# Patient Record
Sex: Male | Born: 1965 | Race: White | Hispanic: No | Smoking: Never smoker
Health system: Southern US, Community
[De-identification: ages and names within clinical notes are randomized; demographics above are authoritative.]

## PROBLEM LIST (undated history)

## (undated) DIAGNOSIS — G709 Myoneural disorder, unspecified: Secondary | ICD-10-CM

## (undated) DIAGNOSIS — T7840XA Allergy, unspecified, initial encounter: Secondary | ICD-10-CM

## (undated) DIAGNOSIS — E079 Disorder of thyroid, unspecified: Secondary | ICD-10-CM

## (undated) DIAGNOSIS — Z21 Asymptomatic human immunodeficiency virus [HIV] infection status: Secondary | ICD-10-CM

## (undated) DIAGNOSIS — K219 Gastro-esophageal reflux disease without esophagitis: Secondary | ICD-10-CM

## (undated) DIAGNOSIS — F32A Depression, unspecified: Secondary | ICD-10-CM

## (undated) DIAGNOSIS — R011 Cardiac murmur, unspecified: Secondary | ICD-10-CM

## (undated) DIAGNOSIS — F419 Anxiety disorder, unspecified: Secondary | ICD-10-CM

## (undated) DIAGNOSIS — B20 Human immunodeficiency virus [HIV] disease: Secondary | ICD-10-CM

## (undated) HISTORY — DX: Myoneural disorder, unspecified: G70.9

## (undated) HISTORY — DX: Allergy, unspecified, initial encounter: T78.40XA

## (undated) HISTORY — DX: Depression, unspecified: F32.A

## (undated) HISTORY — DX: Anxiety disorder, unspecified: F41.9

## (undated) HISTORY — DX: Gastro-esophageal reflux disease without esophagitis: K21.9

## (undated) HISTORY — DX: Disorder of thyroid, unspecified: E07.9

## (undated) HISTORY — DX: Asymptomatic human immunodeficiency virus (hiv) infection status: Z21

## (undated) HISTORY — DX: Human immunodeficiency virus (HIV) disease: B20

## (undated) HISTORY — DX: Cardiac murmur, unspecified: R01.1

---

## 2016-08-07 ENCOUNTER — Telehealth: Payer: Self-pay

## 2016-08-07 ENCOUNTER — Other Ambulatory Visit: Payer: Self-pay | Admitting: *Deleted

## 2016-08-07 ENCOUNTER — Other Ambulatory Visit (INDEPENDENT_AMBULATORY_CARE_PROVIDER_SITE_OTHER): Payer: Self-pay

## 2016-08-07 DIAGNOSIS — B20 Human immunodeficiency virus [HIV] disease: Secondary | ICD-10-CM

## 2016-08-07 DIAGNOSIS — Z113 Encounter for screening for infections with a predominantly sexual mode of transmission: Secondary | ICD-10-CM

## 2016-08-07 DIAGNOSIS — Z79899 Other long term (current) drug therapy: Secondary | ICD-10-CM

## 2016-08-07 LAB — CBC WITH DIFFERENTIAL/PLATELET
BASOS ABS: 55 {cells}/uL (ref 0–200)
Basophils Relative: 1 %
EOS ABS: 110 {cells}/uL (ref 15–500)
Eosinophils Relative: 2 %
HCT: 42.9 % (ref 38.5–50.0)
Hemoglobin: 14.9 g/dL (ref 13.2–17.1)
LYMPHS PCT: 52 %
Lymphs Abs: 2860 cells/uL (ref 850–3900)
MCH: 29.9 pg (ref 27.0–33.0)
MCHC: 34.7 g/dL (ref 32.0–36.0)
MCV: 86.1 fL (ref 80.0–100.0)
MONOS PCT: 9 %
MPV: 9.2 fL (ref 7.5–12.5)
Monocytes Absolute: 495 cells/uL (ref 200–950)
NEUTROS PCT: 36 %
Neutro Abs: 1980 cells/uL (ref 1500–7800)
PLATELETS: 257 10*3/uL (ref 140–400)
RBC: 4.98 MIL/uL (ref 4.20–5.80)
RDW: 13.1 % (ref 11.0–15.0)
WBC: 5.5 10*3/uL (ref 3.8–10.8)

## 2016-08-07 LAB — COMPLETE METABOLIC PANEL WITH GFR
ALK PHOS: 53 U/L (ref 40–115)
ALT: 23 U/L (ref 9–46)
AST: 21 U/L (ref 10–35)
Albumin: 4.2 g/dL (ref 3.6–5.1)
BILIRUBIN TOTAL: 0.8 mg/dL (ref 0.2–1.2)
BUN: 12 mg/dL (ref 7–25)
CO2: 25 mmol/L (ref 20–31)
Calcium: 8.8 mg/dL (ref 8.6–10.3)
Chloride: 105 mmol/L (ref 98–110)
Creat: 0.93 mg/dL (ref 0.70–1.33)
Glucose, Bld: 96 mg/dL (ref 65–99)
Potassium: 3.9 mmol/L (ref 3.5–5.3)
Sodium: 137 mmol/L (ref 135–146)
TOTAL PROTEIN: 7.3 g/dL (ref 6.1–8.1)

## 2016-08-07 LAB — HEPATITIS B SURFACE ANTIGEN: HEP B S AG: NEGATIVE

## 2016-08-07 LAB — LIPID PANEL
CHOL/HDL RATIO: 4.3 ratio (ref <=5.0)
Cholesterol: 149 mg/dL (ref 125–200)
HDL: 35 mg/dL — ABNORMAL LOW (ref >=40)
LDL CALC: 81 mg/dL (ref <130)
Triglycerides: 167 mg/dL — ABNORMAL HIGH (ref <150)
VLDL: 33 mg/dL — AB (ref <30)

## 2016-08-07 LAB — HEPATITIS A ANTIBODY, TOTAL: HEP A TOTAL AB: REACTIVE — AB

## 2016-08-07 LAB — HEPATITIS B SURFACE ANTIBODY,QUALITATIVE: HEP B S AB: POSITIVE — AB

## 2016-08-07 LAB — HEPATITIS C ANTIBODY: HCV Ab: NEGATIVE

## 2016-08-07 LAB — HEPATITIS B CORE ANTIBODY, TOTAL: Hep B Core Total Ab: NONREACTIVE

## 2016-08-07 MED ORDER — CYCLOBENZAPRINE HCL 5 MG PO TABS: 5.0000 mg | ORAL_TABLET | Freq: Every day | ORAL | 3 refills | Status: DC

## 2016-08-07 MED ORDER — SERTRALINE HCL 50 MG PO TABS: 50.0000 mg | ORAL_TABLET | Freq: Every day | ORAL | 3 refills | Status: DC

## 2016-08-07 MED ORDER — LEVOTHYROXINE SODIUM 200 MCG PO TABS: 200.0000 ug | ORAL_TABLET | Freq: Every day | ORAL | 11 refills | Status: DC

## 2016-08-07 NOTE — Telephone Encounter (Signed)
Patient is a recent transfer from [facility] .  Medical records received.  He is in need of assistance with Zoloft and Synthroid Our office is processing patient assistance on the Synthroid and he will need to pay for the Zoloft.  He has one month of Genvoya to date.   Current regimen: Genvoya  Zoloft 50 mg once daily Synthroid 200 mcg daily.

## 2016-08-07 NOTE — Telephone Encounter (Signed)
AB[facility]IE, RX Outreach applications.

## 2016-08-08 LAB — HIV-1 RNA ULTRAQUANT REFLEX TO GENTYP+

## 2016-08-08 LAB — URINE CYTOLOGY ANCILLARY ONLY
CHLAMYDIA, DNA PROBE: NEGATIVE
Neisseria Gonorrhea: NEGATIVE

## 2016-08-08 LAB — RPR: RPR Ser Ql: REACTIVE — AB

## 2016-08-08 LAB — T-HELPER CELL (CD4) - (RCID CLINIC ONLY)
CD4 T CELL HELPER: 28 % — AB (ref 33–55)
CD4 T Cell Abs: 780 /uL (ref 400–2700)

## 2016-08-08 LAB — RPR TITER

## 2016-08-09 LAB — QUANTIFERON TB GOLD ASSAY (BLOOD)
Interferon Gamma Release Assay: NEGATIVE
Mitogen-Nil: 9.36 IU/mL
Quantiferon Nil Value: 0.28 IU/mL
Quantiferon Tb Ag Minus Nil Value: 0.03 IU/mL

## 2016-08-11 ENCOUNTER — Encounter: Payer: Self-pay | Admitting: Internal Medicine

## 2016-08-11 LAB — FLUORESCENT TREPONEMAL AB(FTA)-IGG-BLD: FLUORESCENT TREPONEMAL ABS: REACTIVE — AB

## 2016-08-14 LAB — HLA B*5701: HLA-B*5701 w/rflx HLA-B High: NEGATIVE

## 2016-08-14 LAB — HIV-1 GENOTYPR PLUS

## 2016-08-21 ENCOUNTER — Ambulatory Visit (INDEPENDENT_AMBULATORY_CARE_PROVIDER_SITE_OTHER): Payer: Self-pay | Admitting: Internal Medicine

## 2016-08-21 ENCOUNTER — Encounter: Payer: Self-pay | Admitting: Internal Medicine

## 2016-08-21 DIAGNOSIS — Z8619 Personal history of other infectious and parasitic diseases: Secondary | ICD-10-CM | POA: Insufficient documentation

## 2016-08-21 DIAGNOSIS — M545 Low back pain, unspecified: Secondary | ICD-10-CM | POA: Insufficient documentation

## 2016-08-21 DIAGNOSIS — E038 Other specified hypothyroidism: Secondary | ICD-10-CM

## 2016-08-21 DIAGNOSIS — B20 Human immunodeficiency virus [HIV] disease: Secondary | ICD-10-CM

## 2016-08-21 DIAGNOSIS — R85619 Unspecified abnormal cytological findings in specimens from anus: Secondary | ICD-10-CM

## 2016-08-21 DIAGNOSIS — Z21 Asymptomatic human immunodeficiency virus [HIV] infection status: Secondary | ICD-10-CM | POA: Insufficient documentation

## 2016-08-21 DIAGNOSIS — E663 Overweight: Secondary | ICD-10-CM

## 2016-08-21 DIAGNOSIS — G2581 Restless legs syndrome: Secondary | ICD-10-CM | POA: Insufficient documentation

## 2016-08-21 DIAGNOSIS — R896 Abnormal cytological findings in specimens from other organs, systems and tissues: Secondary | ICD-10-CM

## 2016-08-21 DIAGNOSIS — E039 Hypothyroidism, unspecified: Secondary | ICD-10-CM | POA: Insufficient documentation

## 2016-08-21 MED ORDER — LEVOTHYROXINE SODIUM 200 MCG PO TABS: 200.0000 ug | ORAL_TABLET | Freq: Every day | ORAL | 3 refills | Status: DC

## 2016-08-21 MED ORDER — ELVITEG-COBIC-EMTRICIT-TENOFAF 150-150-200-10 MG PO TABS: 1.0000 | ORAL_TABLET | Freq: Every day | ORAL | 5 refills | Status: DC

## 2016-08-21 MED ORDER — SERTRALINE HCL 50 MG PO TABS: 50.0000 mg | ORAL_TABLET | Freq: Every day | ORAL | 3 refills | Status: DC

## 2016-08-21 NOTE — Progress Notes (Signed)
Patient ID: Drew Jenkins, male    DOB: 08/26/1966, 50 y.o.   MRN: [id number]  Reason for visit: to establish care as a new patient with HIV  HPI:   Patient was first diagnosed in 2002 and started treatment in 2003 initially with Sustiva and Combivir, then Truvada, then Atripla, then Prezista, norvir and Truvada and this year started on [place]. The CD4 count is 780 now and does not remember a nadir, but presumably low to have started medication in 2003, viral load < 20.  Viral load was in the 400s in April of this year.   There have been no associated symptoms of diarrhea, no weight loss.    PMHx: hypothyroidism, depression  Prior to Admission medications   Medication Sig Start Date End Date Taking? Authorizing Provider  elvitegravir-cobicistat-emtricitabine-tenofovir (GENVOYA) 150-150-200-10 MG TABS tablet Take 1 tablet by mouth daily with breakfast. 08/21/16  Yes Gardiner Barefootobert W Comer, MD  levothyroxine (SYNTHROID, LEVOTHROID) 200 MCG tablet Take 1 tablet (200 mcg total) by mouth daily before breakfast. 08/07/16  Yes Judyann Munsonynthia Snider, MD  sertraline (ZOLOFT) 50 MG tablet Take 1 tablet (50 mg total) by mouth daily. 08/07/16  Yes Judyann Munsonynthia Snider, MD    No Known Allergies  Social History  Substance Use Topics  . Smoking status: Never Smoker  . Smokeless tobacco: Never Used  . Alcohol use No    FMHx: mother with type 2 diabetes  Review of Systems Constitutional: negative for fatigue and malaise Gastrointestinal: negative for diarrhea Musculoskeletal: negative for myalgias and arthralgias All other systems reviewed and are negative   CONSTITUTIONAL:in no apparent distress and alert  Vitals:   08/21/16 1344  BP: 109/74  Pulse: 74  Temp: 98.1 F (36.7 C)   EYES: anicteric HENT: no thrush CARD:Cor RRR RESP:CTA B; normal respiratory effort NW:GNFAOGI:Bowel sounds are normal, liver is not enlarged, spleen is not enlarged MS:no pedal edema noted SKIN:no rashes NEURO: non-focal  Lab Results   Component Value Date   HIV1RNAQUANT <20 08/07/2016   No components found for: HIV1GENOTYPRPLUS No components found for: THELPERCELL  Assessment: new patient here with established HIV.  Discussed with patient treatment options and side effects, benefits of treatment, long term outcomes.  I discussed the severity of untreated HIV including higher cancer risk, opportunistic infections, renal failure.  Also discussed needing to use condoms, partner disclosure, necessary vaccines, blood monitoring.  All questions answered.  History of abnormal anal PAP.  Plan: 1) continue Genvoya, will get [facility] coverage if needed until covered 2) I refilled synthroid and zoloft until he gets to a PCP 3) discuss with his PCP if a muscle relaxant is indicated 4) will discuss anal PAP study next visit in [place] 5) refer for PCP

## 2016-09-17 ENCOUNTER — Encounter: Payer: Self-pay | Admitting: Licensed Clinical Social Worker

## 2016-09-19 ENCOUNTER — Other Ambulatory Visit: Payer: Self-pay | Admitting: *Deleted

## 2016-09-19 MED ORDER — ELVITEG-COBIC-EMTRICIT-TENOFAF 150-150-200-10 MG PO TABS: 1.0000 | ORAL_TABLET | Freq: Every day | ORAL | 5 refills | Status: DC

## 2016-09-30 ENCOUNTER — Encounter: Payer: Self-pay | Admitting: Internal Medicine

## 2016-10-08 ENCOUNTER — Ambulatory Visit: Payer: Self-pay | Admitting: Family Medicine

## 2016-11-05 ENCOUNTER — Other Ambulatory Visit: Payer: Self-pay | Admitting: *Deleted

## 2016-11-05 ENCOUNTER — Other Ambulatory Visit (INDEPENDENT_AMBULATORY_CARE_PROVIDER_SITE_OTHER): Payer: Self-pay

## 2016-11-05 DIAGNOSIS — B20 Human immunodeficiency virus [HIV] disease: Secondary | ICD-10-CM

## 2016-11-05 MED ORDER — SERTRALINE HCL 50 MG PO TABS: 50.0000 mg | ORAL_TABLET | Freq: Every day | ORAL | 5 refills | Status: DC

## 2016-11-07 LAB — T-HELPER CELL (CD4) - (RCID CLINIC ONLY)
CD4 % Helper T Cell: 25 % — ABNORMAL LOW (ref 33–55)
CD4 T Cell Abs: 980 /uL (ref 400–2700)

## 2016-11-10 LAB — HIV-1 RNA QUANT-NO REFLEX-BLD
HIV 1 RNA Quant: <20 copies/mL (ref <20)
HIV-1 RNA Quant, Log: <1.3 Log copies/mL (ref <1.30)

## 2016-11-20 ENCOUNTER — Encounter: Payer: Self-pay | Admitting: Internal Medicine

## 2016-11-20 ENCOUNTER — Ambulatory Visit (INDEPENDENT_AMBULATORY_CARE_PROVIDER_SITE_OTHER): Payer: Self-pay | Admitting: Internal Medicine

## 2016-11-20 VITALS — BP 116/68 | HR 60 | Temp 97.8°F | Ht 64.0 in | Wt 155.0 lb

## 2016-11-20 DIAGNOSIS — B20 Human immunodeficiency virus [HIV] disease: Secondary | ICD-10-CM

## 2016-11-20 DIAGNOSIS — Z0000 Encounter for general adult medical examination without abnormal findings: Secondary | ICD-10-CM

## 2016-11-20 DIAGNOSIS — F329 Major depressive disorder, single episode, unspecified: Secondary | ICD-10-CM

## 2016-11-20 DIAGNOSIS — E038 Other specified hypothyroidism: Secondary | ICD-10-CM

## 2016-11-20 DIAGNOSIS — F32A Depression, unspecified: Secondary | ICD-10-CM | POA: Insufficient documentation

## 2016-11-20 DIAGNOSIS — K219 Gastro-esophageal reflux disease without esophagitis: Secondary | ICD-10-CM

## 2016-11-20 LAB — T4, FREE: FREE T4: 1.4 ng/dL (ref 0.8–1.8)

## 2016-11-20 LAB — TSH: TSH: 6.5 mIU/L — ABNORMAL HIGH (ref 0.40–4.50)

## 2016-11-20 MED ORDER — OMEPRAZOLE 20 MG PO CPDR: 20.0000 mg | DELAYED_RELEASE_CAPSULE | Freq: Every day | ORAL | 11 refills | Status: DC

## 2016-11-20 MED ORDER — SERTRALINE HCL 50 MG PO TABS: 50.0000 mg | ORAL_TABLET | Freq: Every day | ORAL | 11 refills | Status: DC

## 2016-11-20 NOTE — Assessment & Plan Note (Signed)
I have refilled his SSRI.  He is going to establish with a PCP

## 2016-11-20 NOTE — Progress Notes (Signed)
CC: Follow up for HIV  Interval history: Currently is asymptomatic and well-controlled on Genvoya.  Since last visit he has continued on his medication via [facility] while getting established with [facility]  Has no associated n/v/d.  Denies any missed doses.     Also has hypothyroidism and depression and is controlled.  He tells me he has had a cough since about July.  No fever, no chills, no weight loss, no night sweats.  Typically dry cough, some occasional sputum.   Prior to Admission medications   Medication Sig Start Date End Date Taking? Authorizing Provider  elvitegravir-cobicistat-emtricitabine-tenofovir (GENVOYA) 150-150-200-10 MG TABS tablet Take 1 tablet by mouth daily with breakfast. 09/19/16   Ginnie SmartJeffrey C Hatcher, MD  levothyroxine (SYNTHROID, LEVOTHROID) 200 MCG tablet Take 1 tablet (200 mcg total) by mouth daily before breakfast. 08/21/16   Gardiner Barefootobert W Shaketta Rill, MD  sertraline (ZOLOFT) 50 MG tablet Take 1 tablet (50 mg total) by mouth daily. 11/05/16   Gardiner Barefootobert W Kenae Lindquist, MD    Review of Systems Constitutional: negative for fatigue and malaise Gastrointestinal: negative for diarrhea Musculoskeletal: negative for myalgias and arthralgias All other systems reviewed and are negative    Physical Exam: CONSTITUTIONAL:in no apparent distress and alert  Eyes: anicteric HENT: no thrush, no cervical lymphadenopathy Respiratory: Normal respiratory effort; CTA B GI: soft, nt  Lab Results  Component Value Date   HIV1RNAQUANT <20 11/05/2016   HIV1RNAQUANT <20 08/07/2016   No components found for: HIV1GENOTYPRPLUS No components found for: THELPERCELL   Social History   Social History  . Marital status: Unknown    Spouse name: N/A  . Number of children: N/A  . Years of education: N/A   Occupational History  . Not on file.   Social History Main Topics  . Smoking status: Never Smoker  . Smokeless tobacco: Never Used  . Alcohol use No  . Drug use: No  . Sexual activity: Not on file   Comment: pt declined condoms   Other Topics Concern  . Not on file   Social History Narrative  . No narrative on file

## 2016-11-20 NOTE — Assessment & Plan Note (Signed)
I will check his TSH and T4 today pending further management by a PCP

## 2016-11-20 NOTE — Addendum Note (Signed)
Addended by: Andree CossHOWELL, Niyonna Betsill M on: 11/20/2016 10:54 AM   Modules accepted: Orders

## 2016-11-20 NOTE — Assessment & Plan Note (Signed)
I suspect his cough is related to this.  I have sent his prilosec to the [facility] pharmacy.

## 2016-11-20 NOTE — Assessment & Plan Note (Signed)
Doing well.  rtc 4 months.  

## 2016-11-26 ENCOUNTER — Other Ambulatory Visit: Payer: Self-pay | Admitting: *Deleted

## 2016-11-26 ENCOUNTER — Telehealth: Payer: Self-pay | Admitting: *Deleted

## 2016-11-26 DIAGNOSIS — R6889 Other general symptoms and signs: Secondary | ICD-10-CM

## 2016-11-26 MED ORDER — OSELTAMIVIR PHOSPHATE 75 MG PO CAPS: 75.0000 mg | ORAL_CAPSULE | Freq: Two times a day (BID) | ORAL | 0 refills | Status: DC

## 2016-11-26 NOTE — Addendum Note (Signed)
Addended by: Andree CossHOWELL, Taniyah Ballow M on: 11/26/2016 04:20 PM   Modules accepted: Orders

## 2016-11-26 NOTE — Telephone Encounter (Signed)
Patient seen last week by Dr. Luciana Axeomer.  Now complaining of temperature up to 102.2 since Monday, Dec., 11.  Patient requesting medication for symptoms listed above.  Message being forwarded to Dr. Luciana Axeomer.

## 2016-11-26 NOTE — Telephone Encounter (Signed)
Patient walked into clinic. RN relayed answer, sent tamiflu rx per phone note, relayed advice per notes.  Patient requested note for work - given. Andree CossHowell, Michelle M, RN

## 2016-11-26 NOTE — Telephone Encounter (Signed)
Can give him Tamiflu 75 mg twice a day for 5 days.  Ibuprofen and hydration of course. thanks

## 2017-01-12 ENCOUNTER — Ambulatory Visit: Payer: Self-pay

## 2017-01-21 ENCOUNTER — Ambulatory Visit: Payer: Self-pay

## 2017-01-21 ENCOUNTER — Telehealth: Payer: Self-pay | Admitting: *Deleted

## 2017-01-21 NOTE — Telephone Encounter (Signed)
Patient asked for help finding primary care. He is also worried he is developing sleep apnea. RN gave him the [facility] to [facility], he is scheduled 3/14 at 9:30.   Patient will need information regarding the [employer] as well as the [employer] Patient Assistance program. Patient notified of appointment, location, phone number for primary care. Andree CossHowell, Keatyn Jawad M, RN

## 2017-01-23 ENCOUNTER — Encounter: Payer: Self-pay | Admitting: Internal Medicine

## 2017-02-12 ENCOUNTER — Ambulatory Visit: Payer: Self-pay | Admitting: Family Medicine

## 2017-02-25 ENCOUNTER — Ambulatory Visit: Payer: Self-pay | Admitting: Family Medicine

## 2017-03-19 ENCOUNTER — Other Ambulatory Visit (INDEPENDENT_AMBULATORY_CARE_PROVIDER_SITE_OTHER): Payer: Self-pay

## 2017-03-19 DIAGNOSIS — B20 Human immunodeficiency virus [HIV] disease: Secondary | ICD-10-CM

## 2017-03-20 LAB — T-HELPER CELL (CD4) - (RCID CLINIC ONLY)
CD4 T CELL HELPER: 22 % — AB (ref 33–55)
CD4 T Cell Abs: 730 /uL (ref 400–2700)

## 2017-03-23 ENCOUNTER — Ambulatory Visit: Payer: Self-pay | Admitting: Internal Medicine

## 2017-03-23 ENCOUNTER — Other Ambulatory Visit: Payer: Self-pay | Admitting: Internal Medicine

## 2017-03-23 DIAGNOSIS — E039 Hypothyroidism, unspecified: Secondary | ICD-10-CM

## 2017-03-23 LAB — HIV-1 RNA QUANT-NO REFLEX-BLD
HIV 1 RNA Quant: 116 copies/mL — ABNORMAL HIGH
HIV-1 RNA Quant, Log: 2.06 Log copies/mL — ABNORMAL HIGH

## 2017-03-31 ENCOUNTER — Ambulatory Visit: Payer: Self-pay

## 2017-04-09 ENCOUNTER — Ambulatory Visit (INDEPENDENT_AMBULATORY_CARE_PROVIDER_SITE_OTHER): Payer: Self-pay | Admitting: Internal Medicine

## 2017-04-09 ENCOUNTER — Other Ambulatory Visit: Payer: Self-pay | Admitting: Internal Medicine

## 2017-04-09 ENCOUNTER — Encounter: Payer: Self-pay | Admitting: Internal Medicine

## 2017-04-09 VITALS — BP 128/77 | HR 54 | Temp 98.3°F | Ht 64.0 in | Wt 159.0 lb

## 2017-04-09 DIAGNOSIS — Z113 Encounter for screening for infections with a predominantly sexual mode of transmission: Secondary | ICD-10-CM

## 2017-04-09 DIAGNOSIS — Z79899 Other long term (current) drug therapy: Secondary | ICD-10-CM

## 2017-04-09 DIAGNOSIS — F329 Major depressive disorder, single episode, unspecified: Secondary | ICD-10-CM

## 2017-04-09 DIAGNOSIS — B20 Human immunodeficiency virus [HIV] disease: Secondary | ICD-10-CM

## 2017-04-09 NOTE — Progress Notes (Signed)
CC: Follow up for HIV  Interval history: Currently is asymptomatic and doing well on Genvoya.   Has no associated n/v/d.  Denies any missed doses.    He is establishing with a PCP next month.  He recently lost his partner and dealing with depression.  No sleep disturbance, no SI.  He had been on ssri but does not feel it is helping. CD4 730, viral load 116.    Prior to Admission medications   Medication Sig Start Date End Date Taking? Authorizing Provider  elvitegravir-cobicistat-emtricitabine-tenofovir (GENVOYA) 150-150-200-10 MG TABS tablet Take 1 tablet by mouth daily with breakfast. 09/19/16   Ginnie Smart, MD  levothyroxine (SYNTHROID, LEVOTHROID) 200 MCG tablet Take 1 tablet (200 mcg total) by mouth daily before breakfast. 08/21/16   Gardiner Barefoot, MD  sertraline (ZOLOFT) 50 MG tablet Take 1 tablet (50 mg total) by mouth daily. 11/05/16   Gardiner Barefoot, MD    Review of Systems Constitutional: negative for fatigue and malaise Gastrointestinal: negative for diarrhea Musculoskeletal: negative for myalgias and arthralgias All other systems reviewed and are negative    Physical Exam: CONSTITUTIONAL:in no apparent distress and alert  Blood pressure 128/77, pulse (!) 54, temperature 98.3 F (36.8 C), temperature source Oral, height  (1.626 m), weight 159 lb (72.1 kg). Eyes: anicteric HENT: no thrush, no cervical lymphadenopathy Respiratory: Normal respiratory effort; CTA B GI: soft, nt  Lab Results  Component Value Date   HIV1RNAQUANT 116 (H) 03/19/2017   HIV1RNAQUANT <20 11/05/2016   HIV1RNAQUANT <20 08/07/2016   No components found for: HIV1GENOTYPRPLUS No components found for: THELPERCELL   Social History   Social History  . Marital status: Unknown    Spouse name: N/A  . Number of children: N/A  . Years of education: N/A   Occupational History  . Not on file.   Social History Main Topics  . Smoking status: Never Smoker  . Smokeless tobacco: Never Used  .  Alcohol use No  . Drug use: No  . Sexual activity: Not on file     Comment: pt declined condoms   Other Topics Concern  . Not on file   Social History Narrative  . No narrative on file

## 2017-04-09 NOTE — Assessment & Plan Note (Signed)
His vl was up a bit to 116 so will recheck today.  rtc 6 months unless concerns with it.

## 2017-04-09 NOTE — Assessment & Plan Note (Addendum)
Has underlying depression and recent loss of his partner, has worsened.  Patient will see our counselor.

## 2017-04-11 LAB — HIV-1 RNA,QN PCR W/REFLEX GENOTYPE
HIV-1 RNA, QN PCR: <1.3 {Log_copies}/mL — ABNORMAL HIGH
HIV-1 RNA, QN PCR: <20 {copies}/mL — ABNORMAL HIGH

## 2017-04-21 ENCOUNTER — Ambulatory Visit (INDEPENDENT_AMBULATORY_CARE_PROVIDER_SITE_OTHER): Payer: Self-pay | Admitting: Family Medicine

## 2017-04-21 ENCOUNTER — Encounter: Payer: Self-pay | Admitting: Family Medicine

## 2017-04-21 VITALS — BP 118/76 | HR 68 | Temp 97.8°F | Resp 16 | Ht 64.0 in | Wt 157.0 lb

## 2017-04-21 DIAGNOSIS — M5441 Lumbago with sciatica, right side: Secondary | ICD-10-CM

## 2017-04-21 DIAGNOSIS — G2581 Restless legs syndrome: Secondary | ICD-10-CM

## 2017-04-21 DIAGNOSIS — Z13220 Encounter for screening for lipoid disorders: Secondary | ICD-10-CM

## 2017-04-21 DIAGNOSIS — B20 Human immunodeficiency virus [HIV] disease: Secondary | ICD-10-CM

## 2017-04-21 DIAGNOSIS — Z131 Encounter for screening for diabetes mellitus: Secondary | ICD-10-CM

## 2017-04-21 DIAGNOSIS — E031 Congenital hypothyroidism without goiter: Secondary | ICD-10-CM

## 2017-04-21 LAB — COMPLETE METABOLIC PANEL WITH GFR
ALT: 22 U/L (ref 9–46)
AST: 23 U/L (ref 10–35)
Albumin: 4.4 g/dL (ref 3.6–5.1)
Alkaline Phosphatase: 53 U/L (ref 40–115)
BILIRUBIN TOTAL: 1 mg/dL (ref 0.2–1.2)
BUN: 7 mg/dL (ref 7–25)
CALCIUM: 9.6 mg/dL (ref 8.6–10.3)
CHLORIDE: 103 mmol/L (ref 98–110)
CO2: 25 mmol/L (ref 20–31)
CREATININE: 0.91 mg/dL (ref 0.70–1.33)
GFR, Est Non African American: >89 mL/min (ref >=60)
Glucose, Bld: 71 mg/dL (ref 65–99)
Potassium: 4.6 mmol/L (ref 3.5–5.3)
Sodium: 140 mmol/L (ref 135–146)
Total Protein: 7.7 g/dL (ref 6.1–8.1)

## 2017-04-21 LAB — CBC WITH DIFFERENTIAL/PLATELET
Basophils Absolute: 0 cells/uL (ref 0–200)
Basophils Relative: 0 %
Eosinophils Absolute: 152 cells/uL (ref 15–500)
Eosinophils Relative: 2 %
HCT: 46.3 % (ref 38.5–50.0)
HEMOGLOBIN: 15.8 g/dL (ref 13.2–17.1)
LYMPHS ABS: 3800 {cells}/uL (ref 850–3900)
LYMPHS PCT: 50 %
MCH: 29.1 pg (ref 27.0–33.0)
MCHC: 34.1 g/dL (ref 32.0–36.0)
MCV: 85.3 fL (ref 80.0–100.0)
MONO ABS: 684 {cells}/uL (ref 200–950)
MPV: 9.3 fL (ref 7.5–12.5)
Monocytes Relative: 9 %
NEUTROS PCT: 39 %
Neutro Abs: 2964 cells/uL (ref 1500–7800)
Platelets: 288 10*3/uL (ref 140–400)
RBC: 5.43 MIL/uL (ref 4.20–5.80)
RDW: 13.9 % (ref 11.0–15.0)
WBC: 7.6 10*3/uL (ref 3.8–10.8)

## 2017-04-21 LAB — POCT URINALYSIS DIP (DEVICE)
BILIRUBIN URINE: NEGATIVE
GLUCOSE, UA: NEGATIVE mg/dL
Ketones, ur: NEGATIVE mg/dL
LEUKOCYTES UA: NEGATIVE
NITRITE: NEGATIVE
Protein, ur: NEGATIVE mg/dL
Urobilinogen, UA: 0.2 mg/dL (ref 0.0–1.0)
pH: 6 (ref 5.0–8.0)

## 2017-04-21 LAB — LIPID PANEL
Cholesterol: 186 mg/dL (ref <200)
HDL: 47 mg/dL (ref >40)
LDL CALC: 108 mg/dL — AB (ref <100)
Total CHOL/HDL Ratio: 4 Ratio (ref <5.0)
Triglycerides: 156 mg/dL — ABNORMAL HIGH (ref <150)
VLDL: 31 mg/dL — ABNORMAL HIGH (ref <30)

## 2017-04-21 MED ORDER — VENLAFAXINE HCL ER 75 MG PO CP24: 150.0000 mg | ORAL_CAPSULE | Freq: Every day | ORAL | 1 refills | Status: DC

## 2017-04-21 MED ORDER — RANITIDINE HCL 150 MG PO TABS: 150.0000 mg | ORAL_TABLET | Freq: Two times a day (BID) | ORAL | 0 refills | Status: DC

## 2017-04-21 MED ORDER — GABAPENTIN 100 MG PO CAPS: 100.0000 mg | ORAL_CAPSULE | Freq: Three times a day (TID) | ORAL | 3 refills | Status: DC

## 2017-04-21 NOTE — Progress Notes (Signed)
Patient ID: Drew Jenkins, male    DOB: 10/13/1966, 51 y.o.   MRN: [id number]  PCP: Bing NeighborsHarris, Kal Chait S, FNP  Chief Complaint  Patient presents with  . Establish Care    RESTLESS LEGS  . Shoulder Pain  . Back Pain    Subjective:  HPI Drew Jenkins is a 51 y.o. male presents to establish care.  Drew Jenkins medical problems include GERD, hypothyroidism since birth , HIV, Restless leg, Depression, Chronic pain  [facility] prior to moving to [place]. For HIV management, he is followed by Dr.Comer at infectious disease.  Drew Jenkins has been previously diagnosed and treated with restless leg syndrome. Denies any pain associated with legs. Troublesome symptoms include constant jerking in both leg that keeps him awake all night.  Previously prescribed a muscle relaxant which relieved symptoms. Uncertain of which medication he was previously prescribed.  Diagnosed with degenerative disc disease in 2012. Back pain is not causing right shoulder pain and neck pain. Back pain is sometimes unilateral and bilateral. Unable to stand for long periods of time and lots of pain with walking  and prolonged sitting.Pain shoots into buttocks and upper thighs when severe.  Feels as if balance is impair. He has only taken over the counter medications such as ibuprofen and naproxen.  Prior diagnosis of depression and is currently treated with sertraline 50 mg daily. Reports a few weeks ago  He began experiencing worsening depression symptoms. He admits to 6 Zoloft pills in an attempt to commit  Suicide. Reports that he only attempted suicide once a few weeks ago. He did not seek out help or go to the  emergency room. Situations that causes stress include: financial hardship, dealing with chronic pain, he has applied for disability and his request is currently pending. Denies any suicide thoughts today or yesterday. Denies that he has a plan. Willing to seek out  counseling if he is provided with a resource.  Social History   Social History  . Marital status: Unknown    Spouse name: N/A  . Number of children: N/A  . Years of education: N/A   Occupational History  . Not on file.   Social History Main Topics  . Smoking status: Never Smoker  . Smokeless tobacco: Never Used  . Alcohol use No  . Drug use: No  . Sexual activity: Not on file     Comment: pt declined condoms   Other Topics Concern  . Not on file   Social History Narrative  . No narrative on file   Review of Systems  See HPI  Patient Active Problem List   Diagnosis Date Noted  . Screening examination for venereal disease 04/09/2017  . Encounter for long-term (current) use of high-risk medication 04/09/2017  . Depression 11/20/2016  . GERD (gastroesophageal reflux disease) 11/20/2016  . Human immunodeficiency virus I infection (HCC) 08/21/2016  . Hypothyroidism 08/21/2016  . Restless leg 08/21/2016  . H/O syphilis 08/21/2016  . Overweight (BMI 25.0-29.9) 08/21/2016  . Abnormal anal Papanicolaou smear 08/21/2016  . Lumbago 08/21/2016    No Known Allergies  Prior to Admission medications   Medication Sig Start Date End Date Taking? Authorizing Provider  elvitegravir-cobicistat-emtricitabine-tenofovir (GENVOYA) 150-150-200-10 MG TABS tablet Take 1 tablet by mouth daily with breakfast. 09/19/16  Yes Ginnie SmartHatcher, Jeffrey C, MD  levothyroxine (SYNTHROID, LEVOTHROID) 200 MCG tablet TAKE 1 TABLET BY MOUTH DAILY BEFORE BREAKFAST 03/23/17  Yes Comer, Belia Hemanobert W, MD  omeprazole (PRILOSEC)  20 MG capsule Take 1 capsule (20 mg total) by mouth daily. 11/20/16  Yes Comer, Belia Heman, MD  sertraline (ZOLOFT) 50 MG tablet Take 1 tablet (50 mg total) by mouth daily. 11/20/16  Yes Comer, Belia Heman, MD   Past Medical, Surgical Family and Social History reviewed and updated.  Depression screen [facility] 2/9 04/21/2017 04/09/2017 11/20/2016 08/21/2016  Decreased Interest 3 3 0 1  Down, Depressed, Hopeless 2 3  0 1  PHQ - 2 Score 5 6 0 2  Altered sleeping 3 2 - 1  Tired, decreased energy 3 2 - 1  Change in appetite 3 1 - 0  Feeling bad or failure about yourself  3 1 - 0  Trouble concentrating 3 1 - 0  Moving slowly or fidgety/restless 0 0 - 0  Suicidal thoughts 2 0 - 0  PHQ-9 Score 22 13 - 4  Difficult doing work/chores - Very difficult - Somewhat difficult      Objective:   Today's Vitals   04/21/17 1034  BP: 118/76  Pulse: 68  Resp: 16  Temp: 97.8 F (36.6 C)  TempSrc: Oral  SpO2: 100%  Weight: 157 lb (71.2 kg)  Height: 5\' 4"  (1.626 m)    Wt Readings from Last 3 Encounters:  04/21/17 157 lb (71.2 kg)  04/09/17 159 lb (72.1 kg)  11/20/16 155 lb (70.3 kg)    Physical Exam  Constitutional: He is oriented to person, place, and time. He appears well-developed and well-nourished.  HENT:  Head: Normocephalic and atraumatic.  Right Ear: External ear normal.  Left Ear: External ear normal.  Eyes: Conjunctivae and EOM are normal. Pupils are equal, round, and reactive to light.  Neck: Normal range of motion. Neck supple.  Cardiovascular: Normal rate, regular rhythm, normal heart sounds and intact distal pulses.   Pulmonary/Chest: Effort normal and breath sounds normal.  Abdominal: Soft. Bowel sounds are normal.  Musculoskeletal: Normal range of motion.  Neurological: He is alert and oriented to person, place, and time.  Skin: Skin is warm and dry.  Psychiatric: He has a normal mood and affect. His behavior is normal. Judgment and thought content normal.     Assessment & Plan:  1. Restless leg syndrome -Start cyclobenzaprine 10 mg up to 3 times daily as needed   2. Screening for diabetes mellitus - COMPLETE METABOLIC PANEL WITH GFR - Hemoglobin A1c  3. Congenital hypothyroidism without goiter - Thyroid Panel With TSH  4. Bilateral low back pain with right-sided sciatica, unspecified chronicity - Vitamin D, 25-hydroxy -Start cyclobenzaprine 10 mg up to 3 times daily as  needed   5. HIV (human immunodeficiency virus infection) (HCC) -Continue follow-up with Infectious Disease   6. Screening, lipid - Lipid panel  7. Major Depression -Venlafaxine XR (Effexor) 150 mg once daily -Continue Zoloft for 7 days then discontinue  -Information provided to follow-up with [employer]   RTC:  2 weeks depression follow-up     Godfrey Pick. Tiburcio Pea, MSN, [facility] [address] [place], [place] [address] [phone]

## 2017-04-21 NOTE — Patient Instructions (Addendum)
Please reach out to [facility]  [address] [place], [place] [address] [phone]  Please reach out to these services for counseling services.  For Depression, continue Zoloft for 7 days then stop. I am starting you on Effexor for depression. Start 75 mg (1 tablet) once daily for 7 days and then increase to 150 mg (2 tablets) daily.  For restless leg, start cyclobenzaprine 10 mg up to 3 times daily for leg pain.  For back, shoulder, and neck pain start Gabapentin 100 mg, 3 times daily as needed for pain.    Back Pain, Adult Back pain is very common. The pain often gets better over time. The cause of back pain is usually not dangerous. Most people can learn to manage their back pain on their own. Follow these instructions at home: Watch your back pain for any changes. The following actions may help to lessen any pain you are feeling:  Stay active. Start with short walks on flat ground if you can. Try to walk farther each day.  Exercise regularly as told by your doctor. Exercise helps your back heal faster. It also helps avoid future injury by keeping your muscles strong and flexible.  Do not sit, drive, or stand in one place for more than 30 minutes.  Do not stay in bed. Resting more than 1-2 days can slow down your recovery.  Be careful when you bend or lift an object. Use good form when lifting:  Bend at your knees.  Keep the object close to your body.  Do not twist.  Sleep on a firm mattress. Lie on your side, and bend your knees. If you lie on your back, put a pillow under your knees.  Take medicines only as told by your doctor.  Put ice on the injured area.  Put ice in a plastic bag.  Place a towel between your skin and the bag.  Leave the ice on for 20 minutes, 2-3 times a day for the first 2-3 days. After that, you can switch between ice and heat packs.  Avoid feeling anxious or stressed. Find good ways to deal with stress, such as  exercise.  Maintain a healthy weight. Extra weight puts stress on your back. Contact a doctor if:  You have pain that does not go away with rest or medicine.  You have worsening pain that goes down into your legs or buttocks.  You have pain that does not get better in one week.  You have pain at night.  You lose weight.  You have a fever or chills. Get help right away if:  You cannot control when you poop (bowel movement) or pee (urinate).  Your arms or legs feel weak.  Your arms or legs lose feeling (numbness).  You feel sick to your stomach (nauseous) or throw up (vomit).  You have belly (abdominal) pain.  You feel like you may pass out (faint). This information is not intended to replace advice given to you by your health care provider. Make sure you discuss any questions you have with your health care provider. Document Released: 05/19/2008 Document Revised: 05/08/2016 Document Reviewed: 04/04/2014 Elsevier Interactive Patient Education  2017 Elsevier Inc.  Major Depressive Disorder, Adult Major depressive disorder (MDD) is a mental health condition. It may also be called clinical depression or unipolar depression. MDD usually causes feelings of sadness, hopelessness, or helplessness. MDD can also cause physical symptoms. It can interfere with work, school, relationships, and other everyday activities.  MDD may be mild, moderate, or severe. It may occur once (single episode major depressive disorder) or it may occur multiple times (recurrent major depressive disorder). What are the causes? The exact cause of this condition is not known. MDD is most likely caused by a combination of things, which may include:  Genetic factors. These are traits that are passed along from parent to child.  Individual factors. Your personality, your behavior, and the way you handle your thoughts and feelings may contribute to MDD. This includes personality traits and behaviors learned from  others.  Physical factors, such as:  Differences in the part of your brain that controls emotion. This part of your brain may be different than it is in people who do not have MDD.  Long-term (chronic) medical or psychiatric illnesses.  Social factors. Traumatic experiences or major life changes may play a role in the development of MDD. What increases the risk? This condition is more likely to develop in women. The following factors may also make you more likely to develop MDD:  A family history of depression.  Troubled family relationships.  Abnormally low levels of certain brain chemicals.  Traumatic events in childhood, especially abuse or the loss of a parent.  Being under a lot of stress, or long-term stress, especially from upsetting life experiences or losses.  A history of:  Chronic physical illness.  Other mental health disorders.  Substance abuse.  Poor living conditions.  Experiencing social exclusion or discrimination on a regular basis. What are the signs or symptoms? The main symptoms of MDD typically include:  Constant depressed or irritable mood.  Loss of interest in things and activities. MDD symptoms may also include:  Sleeping or eating too much or too little.  Unexplained weight change.  Fatigue or low energy.  Feelings of worthlessness or guilt.  Difficulty thinking clearly or making decisions.  Thoughts of suicide or of harming others.  Physical agitation or weakness.  Isolation. Severe cases of MDD may also occur with other symptoms, such as:  Delusions or hallucinations, in which you imagine things that are not real (psychotic depression).  Low-level depression that lasts at least a year (chronic depression or persistent depressive disorder).  Extreme sadness and hopelessness (melancholic depression).  Trouble speaking and moving (catatonic depression). How is this diagnosed? This condition may be diagnosed based on:  Your  symptoms.  Your medical history, including your mental health history. This may involve tests to evaluate your mental health. You may be asked questions about your lifestyle, including any drug and alcohol use, and how long you have had symptoms of MDD.  A physical exam.  Blood tests to rule out other conditions. You must have a depressed mood and at least four other MDD symptoms most of the day, nearly every day in the same 2-week timeframe before your health care provider can confirm a diagnosis of MDD. How is this treated? This condition is usually treated by mental health professionals, such as psychologists, psychiatrists, and clinical social workers. You may need more than one type of treatment. Treatment may include:  Psychotherapy. This is also called talk therapy or counseling. Types of psychotherapy include:  Cognitive behavioral therapy (CBT). This type of therapy teaches you to recognize unhealthy feelings, thoughts, and behaviors, and replace them with positive thoughts and actions.  Interpersonal therapy (IPT). This helps you to improve the way you relate to and communicate with others.  Family therapy. This treatment includes members of your family.  Medicine to  treat anxiety and depression, or to help you control certain emotions and behaviors.  Lifestyle changes, such as:  Limiting alcohol and drug use.  Exercising regularly.  Getting plenty of sleep.  Making healthy eating choices.  Spending more time outdoors. Treatments involving stimulation of the brain can be used in situations with extremely severe symptoms, or when medicine or other therapies do not work over time. These treatments include electroconvulsive therapy, transcranial magnetic stimulation, and vagal nerve stimulation. Follow these instructions at home: Activity   Return to your normal activities as told by your health care provider.  Exercise regularly and spend time outdoors as told by your  health care provider. General instructions   Take over-the-counter and prescription medicines only as told by your health care provider.  Do not drink alcohol. If you drink alcohol, limit your alcohol intake to no more than 1 drink a day for nonpregnant women and 2 drinks a day for men. One drink equals 12 oz of beer, 5 oz of wine, or 1 oz of hard liquor. Alcohol can affect any antidepressant medicines you are taking. Talk to your health care provider about your alcohol use.  Eat a healthy diet and get plenty of sleep.  Find activities that you enjoy doing, and make time to do them.  Consider joining a support group. Your health care provider may be able to recommend a support group.  Keep all follow-up visits as told by your health care provider. This is important. Where to find more information: The [employer] on Mental Illness  www.nami.org [place] [employer] of Mental Health  http://www.maynard.net/ National Suicide Prevention Lifeline  1-800-273-TALK (978)078-8847). This is free, 24-hour help. Contact a health care provider if:  Your symptoms get worse.  You develop new symptoms. Get help right away if:  You self-harm.  You have serious thoughts about hurting yourself or others.  You see, hear, taste, smell, or feel things that are not present (hallucinate). This information is not intended to replace advice given to you by your health care provider. Make sure you discuss any questions you have with your health care provider. Document Released: 03/28/2013 Document Revised: 08/07/2016 Document Reviewed: 06/11/2016 Elsevier Interactive Patient Education  2017 [employer].

## 2017-04-22 LAB — THYROID PANEL WITH TSH
Free Thyroxine Index: 3.2 (ref 1.4–3.8)
T3 UPTAKE: 31 % (ref 22–35)
T4 TOTAL: 10.2 ug/dL (ref 4.5–12.0)
TSH: 4.31 mIU/L (ref 0.40–4.50)

## 2017-04-22 LAB — VITAMIN D 25 HYDROXY (VIT D DEFICIENCY, FRACTURES): VIT D 25 HYDROXY: 17 ng/mL — AB (ref 30–100)

## 2017-04-22 LAB — HEMOGLOBIN A1C
HEMOGLOBIN A1C: 4.7 % (ref <5.7)
MEAN PLASMA GLUCOSE: 88 mg/dL

## 2017-04-22 MED ORDER — VITAMIN D (ERGOCALCIFEROL) 1.25 MG (50000 UNIT) PO CAPS: 50000.0000 [IU] | ORAL_CAPSULE | ORAL | 1 refills | Status: DC

## 2017-04-22 MED ORDER — CYCLOBENZAPRINE HCL 10 MG PO TABS: 10.0000 mg | ORAL_TABLET | Freq: Three times a day (TID) | ORAL | 0 refills | Status: DC | PRN

## 2017-05-05 ENCOUNTER — Encounter: Payer: Self-pay | Admitting: Family Medicine

## 2017-05-05 ENCOUNTER — Ambulatory Visit (INDEPENDENT_AMBULATORY_CARE_PROVIDER_SITE_OTHER): Payer: No Typology Code available for payment source | Admitting: Family Medicine

## 2017-05-05 VITALS — BP 114/75 | HR 83 | Temp 98.1°F | Ht 64.0 in | Wt 158.0 lb

## 2017-05-05 DIAGNOSIS — F321 Major depressive disorder, single episode, moderate: Secondary | ICD-10-CM

## 2017-05-05 NOTE — Progress Notes (Signed)
Patient ID: Drew Jenkins, male    DOB: 12/07/1966, 51 y.o.   MRN: [id number]  PCP: Drew Jenkins  Chief Complaint  Patient presents with  . Jenkins    DEPRESSION    Subjective:  HPI Drew Jenkins. Drew Jenkins was seen in clinic 04/21/2017 to establish care and was found to be actively suffering from  major depression symptoms. He had been experiencing thoughts of suicide and reported an attempt to  Overdose by taking several doses of Zoloft. During his last visit he was started on Effexor and advised to  discontinue Zoloft. He reports improvement of symptoms of depression and reports overall  Improvement of mood. 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 For this reason, he is not  Interested in starting any medication for sleep. Social History   Social History  . Marital status: Unknown    Spouse name: N/A  . Number of children: N/A  . Years of education: N/A   Occupational History  . Not on file.   Social History Jenkins Topics  . Smoking status: Never Smoker  . Smokeless tobacco: Never Used  . Alcohol use No  . Drug use: No  . Sexual activity: Not on file     Comment: pt declined condoms   Other Topics Concern  . Not on file   Social History Narrative  . No narrative on file   Review of Systems See HPI  Patient Active Problem List   Diagnosis Date Noted  . Screening examination for venereal disease 04/09/2017  . Encounter for long-term (current) use of high-risk medication 04/09/2017  . Depression 11/20/2016  . GERD (gastroesophageal reflux disease) 11/20/2016  . Human immunodeficiency virus I infection (HCC) 08/21/2016  . Hypothyroidism 08/21/2016  . Restless leg 08/21/2016  . H/O syphilis 08/21/2016  . Overweight (BMI 25.0-29.9) 08/21/2016  . Abnormal  anal Papanicolaou smear 08/21/2016  . Lumbago 08/21/2016    No Known Allergies  Prior to Admission medications   Medication Sig Start Date End Date Taking? Authorizing Provider  cyclobenzaprine (FLEXERIL) 10 MG tablet Take 1 tablet (10 mg total) by mouth 3 (three) times daily as needed for muscle spasms. 04/22/17  Yes Drew Jenkins, Drew Wenzler Jenkins, Jenkins  elvitegravir-cobicistat-emtricitabine-tenofovir (GENVOYA) 150-150-200-10 MG TABS tablet Take 1 tablet by mouth daily with breakfast. 09/19/16  Yes Drew Jenkins  gabapentin (NEURONTIN) 100 MG capsule Take 1 capsule (100 mg total) by mouth 3 (three) times daily. 04/21/17  Yes Drew Jenkins, Xylina Rhoads Jenkins, Jenkins  levothyroxine (SYNTHROID, LEVOTHROID) 200 MCG tablet TAKE 1 TABLET BY MOUTH DAILY BEFORE BREAKFAST 03/23/17  Yes Drew Jenkins  ranitidine (ZANTAC) 150 MG tablet Take 1 tablet (150 mg total) by mouth 2 (two) times daily. 04/21/17  Yes Drew Jenkins, Martita Brumm Jenkins, Jenkins  sertraline (ZOLOFT) 50 MG tablet Take 1 tablet (50 mg total) by mouth daily. 11/20/16  Yes Drew Jenkins  venlafaxine XR (EFFEXOR-XR) 75 MG 24 hr capsule Take 2 capsules (150 mg total) by mouth daily with breakfast. 04/21/17  Yes Drew Jenkins, Sharif Rendell Jenkins, Jenkins  Vitamin D, Ergocalciferol, (DRISDOL) 50000 units CAPS capsule Take 1 capsule (50,000 Units total) by mouth every 7 (seven) days. 04/22/17  Yes Drew Jenkins, Araeya Lamb Jenkins, Jenkins    Past Medical, Surgical Family and Social History reviewed and updated.  Objective:   Today'Jenkins Vitals   05/05/17 0851  BP: 114/75  Pulse: 83  Temp: 98.1 F (36.7 C)  TempSrc: Oral  SpO2: 98%  Weight: 158 lb (71.7 kg)  Height: 5\' 4"  (1.626 m)    Wt Readings from Last 3 Encounters:  05/05/17 158 lb (71.7 kg)  04/21/17 157 lb (71.2 kg)  04/09/17 159 lb (72.1 kg)    Depression screen Drew Jenkins 2/9 05/05/2017 04/21/2017 04/09/2017 11/20/2016 08/21/2016  Decreased Interest 0 3 3 0 1  Down, Depressed, Hopeless 1 2 3  0 1  PHQ - 2 Score 1 5 6  0 2  Altered sleeping 1 3 2  - 1   Tired, decreased energy 3 3 2  - 1  Change in appetite 3 3 1  - 0  Feeling bad or failure about yourself  3 3 1  - 0  Trouble concentrating 0 3 1 - 0  Moving slowly or fidgety/restless 0 0 0 - 0  Suicidal thoughts 0 2 0 - 0  PHQ-9 Score 11 22 13  - 4  Difficult doing work/chores - - Very difficult - Somewhat difficult   Physical Exam  Constitutional: He is oriented to person, place, and time. He appears well-developed and well-nourished.  HENT:  Head: Normocephalic and atraumatic.  Eyes: Conjunctivae and EOM are normal. Pupils are equal, round, and reactive to light.  Neck: Normal range of motion. Neck supple.  Cardiovascular: Normal rate, regular rhythm, normal heart sounds and intact distal pulses.   Pulmonary/Chest: Effort normal and breath sounds normal.  Musculoskeletal: Normal range of motion. He exhibits tenderness.  Chronic low back pain  Neurological: He is alert and oriented to person, place, and time. He has normal reflexes.  Skin: Skin is warm and dry.  Psychiatric: He has a normal mood and affect. His behavior is normal. Judgment and thought content normal.    Assessment & Plan:  1. Current moderate episode of major depressive disorder without prior episode (HCC) -Continue Effexor 150 mg once daily. -If you are still taking Zoloft, discontinue. -Exercise 15-20 minutes daily to improve mood and decrease fatigue -Reach out to Drew Jenkins to schedule counseling session to  improve symptoms of depression.   RTC: 1 month depression Jenkins   Drew Jenkins. Drew Pea, MSN, Jenkins-C [facility] [place]  [address] [place], [place] [address] [phone]

## 2017-05-05 NOTE — Patient Instructions (Addendum)
Continue Effexor as prescribed. Discontinue Zoloft.  Reach out to [facility] to schedule counseling session to improve symptoms of depression.  [phone] [address], [place] [address]  To increase energy, walk vigorously for 15-20 minutes daily to improve energy level.   Major Depressive Disorder, Adult Major depressive disorder (MDD) is a mental health condition. MDD often makes you feel sad, hopeless, or helpless. MDD can also cause symptoms in your body. MDD can affect your:  Work.  School.  Relationships.  Other normal activities. MDD can range from mild to very bad. It may occur once (single episode MDD). It can also occur many times (recurrent MDD). The main symptoms of MDD often include:  Feeling sad, depressed, or irritable most of the time.  Loss of interest. MDD symptoms also include:  Sleeping too much or too little.  Eating too much or too little.  A change in your weight.  Feeling tired (fatigue) or having low energy.  Feeling worthless.  Feeling guilty.  Trouble making decisions.  Trouble thinking clearly.  Thoughts of suicide or harming others.  Feeling weak.  Feeling agitated.  Keeping yourself from being around other people (isolation). Follow these instructions at home: Activity   Do these things as told by your doctor:  Go back to your normal activities.  Exercise regularly.  Spend time outdoors. Alcohol   Talk with your doctor about how alcohol can affect your antidepressant medicines.  Do not drink alcohol. Or, limit how much alcohol you drink.  This means no more than 1 drink a day for nonpregnant women and 2 drinks a day for men. One drink equals one of these:  12 oz of beer.  5 oz of wine.  1 oz of hard liquor. General instructions   Take over-the-counter and prescription medicines only as told by your doctor.  Eat a healthy diet.  Get plenty of sleep.  Find activities that  you enjoy. Make time to do them.  Think about joining a support group. Your doctor may be able to suggest a group for you.  Keep all follow-up visits as told by your doctor. This is important. Where to find more information:   The [employer] on Mental Illness:  www.nami.org  [place] [employer] of Mental Health:  http://www.maynard.net/  National Suicide Prevention Lifeline:  802-790-7788. This is free, 24-hour help. Contact a doctor if:  Your symptoms get worse.  You have new symptoms. Get help right away if:  You self-harm.  You see, hear, taste, smell, or feel things that are not present (hallucinate). If you ever feel like you may hurt yourself or others, or have thoughts about taking your own life, get help right away. You can go to your nearest emergency department or call:  Your local emergency services (911 in the [place]  A suicide crisis helpline, such as the National Suicide Prevention Lifeline:  (859)235-5138. This is open 24 hours a day. This information is not intended to replace advice given to you by your health care provider. Make sure you discuss any questions you have with your health care provider. Document Released: 11/12/2015 Document Revised: 08/17/2016 Document Reviewed: 08/17/2016 Elsevier Interactive Patient Education  2017 Elsevier Inc.     Fatigue Fatigue is feeling tired all of the time, a lack of energy, or a lack of motivation. Occasional or mild fatigue is often a normal response to activity or life in general. However, long-lasting (chronic) or extreme fatigue may indicate an underlying medical condition.  Follow these instructions at home: Watch your fatigue for any changes. The following actions may help to lessen any discomfort you are feeling:  Talk to your health care provider about how much sleep you need each night. Try to get the required amount every night.  Take medicines only as directed by your health care  provider.  Eat a healthy and nutritious diet. Ask your health care provider if you need help changing your diet.  Drink enough fluid to keep your urine clear or pale yellow.  Practice ways of relaxing, such as yoga, meditation, massage therapy, or acupuncture.  Exercise regularly.  Change situations that cause you stress. Try to keep your work and personal routine reasonable.  Do not abuse illegal drugs.  Limit alcohol intake to no more than 1 drink per day for nonpregnant women and 2 drinks per day for men. One drink equals 12 ounces of beer, 5 ounces of wine, or 1 ounces of hard liquor.  Take a multivitamin, if directed by your health care provider. Contact a health care provider if:  Your fatigue does not get better.  You have a fever.  You have unintentional weight loss or gain.  You have headaches.  You have difficulty:  Falling asleep.  Sleeping throughout the night.  You feel angry, guilty, anxious, or sad.  You are unable to have a bowel movement (constipation).  You skin is dry.  Your legs or another part of your body is swollen. Get help right away if:  You feel confused.  Your vision is blurry.  You feel faint or pass out.  You have a severe headache.  You have severe abdominal, pelvic, or back pain.  You have chest pain, shortness of breath, or an irregular or fast heartbeat.  You are unable to urinate or you urinate less than normal.  You develop abnormal bleeding, such as bleeding from the rectum, vagina, nose, lungs, or nipples.  You vomit blood.  You have thoughts about harming yourself or committing suicide.  You are worried that you might harm someone else. This information is not intended to replace advice given to you by your health care provider. Make sure you discuss any questions you have with your health care provider. Document Released: 09/28/2007 Document Revised: 05/08/2016 Document Reviewed: 04/04/2014 Elsevier Interactive  Patient Education  2017 [employer]

## 2017-05-14 ENCOUNTER — Other Ambulatory Visit: Payer: Self-pay | Admitting: Infectious Diseases

## 2017-05-14 ENCOUNTER — Other Ambulatory Visit: Payer: Self-pay | Admitting: Family Medicine

## 2017-06-02 ENCOUNTER — Ambulatory Visit: Payer: No Typology Code available for payment source | Admitting: Family Medicine

## 2017-06-10 ENCOUNTER — Other Ambulatory Visit: Payer: Self-pay | Admitting: Family Medicine

## 2017-06-11 ENCOUNTER — Other Ambulatory Visit: Payer: Self-pay | Admitting: Family Medicine

## 2017-06-11 ENCOUNTER — Other Ambulatory Visit: Payer: Self-pay | Admitting: Infectious Diseases

## 2017-06-11 DIAGNOSIS — B20 Human immunodeficiency virus [HIV] disease: Secondary | ICD-10-CM

## 2017-06-16 ENCOUNTER — Ambulatory Visit (INDEPENDENT_AMBULATORY_CARE_PROVIDER_SITE_OTHER): Payer: No Typology Code available for payment source | Admitting: Family Medicine

## 2017-06-16 ENCOUNTER — Encounter: Payer: Self-pay | Admitting: Family Medicine

## 2017-06-16 VITALS — BP 120/86 | HR 90 | Temp 98.0°F | Resp 14 | Ht 64.0 in | Wt 159.0 lb

## 2017-06-16 DIAGNOSIS — F331 Major depressive disorder, recurrent, moderate: Secondary | ICD-10-CM

## 2017-06-16 MED ORDER — RANITIDINE HCL 300 MG PO TABS: ORAL_TABLET | ORAL | 2 refills | Status: DC

## 2017-06-16 MED ORDER — CYCLOBENZAPRINE HCL 10 MG PO TABS: 10.0000 mg | ORAL_TABLET | Freq: Three times a day (TID) | ORAL | 2 refills | Status: DC | PRN

## 2017-06-16 MED ORDER — VENLAFAXINE HCL ER 75 MG PO CP24: ORAL_CAPSULE | ORAL | 2 refills | Status: DC
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
## 2017-06-16 NOTE — Patient Instructions (Signed)
Local mental health resources: Please contact one of these behavioral health resource centers obtain counseling services.  If you begin to experience any suicidal thoughts, thoughts of developing a plan of suicide, and or thoughts of harming others, report immediately to the emergency department.  [email or web address] via smart phone or computer . By phone: [phone] or [phone]  [facility] -[phone]   [facility], [place], [place] [phone]    [facility] [place], [place] [phone]   [facility] [phone]    Major Depressive Disorder, Adult Major depressive disorder (MDD) is a mental health condition. MDD often makes you feel sad, hopeless, or helpless. MDD can also cause symptoms in your body. MDD can affect your:  Work.  School.  Relationships.  Other normal activities.  MDD can range from mild to very bad. It may occur once (single episode MDD). It can also occur many times (recurrent MDD). The main symptoms of MDD often include:  Feeling sad, depressed, or irritable most of the time.  Loss of interest.  MDD symptoms also include:  Sleeping too much or too little.  Eating too much or too little.  A change in your weight.  Feeling tired (fatigue) or having low energy.  Feeling worthless.  Feeling guilty.  Trouble making decisions.  Trouble thinking clearly.  Thoughts of suicide or harming others.  Feeling weak.  Feeling agitated.  Keeping yourself from being around other people (isolation).  Follow these instructions at home: Activity  Do these things as told by your doctor: ? Go back to your normal activities. ? Exercise regularly. ? Spend time outdoors. Alcohol  Talk with your doctor about how alcohol can affect your antidepressant medicines.  Do not drink alcohol. Or, limit how much alcohol you drink. ? This  means no more than 1 drink a day for nonpregnant women and 2 drinks a day for men. One drink equals one of these:  12 oz of beer.  5 oz of wine.  1 oz of hard liquor. General instructions  Take over-the-counter and prescription medicines only as told by your doctor.  Eat a healthy diet.  Get plenty of sleep.  Find activities that you enjoy. Make time to do them.  Think about joining a support group. Your doctor may be able to suggest a group for you.  Keep all follow-up visits as told by your doctor. This is important. Where to find more information:  The [employer] Alliance on Mental Illness: ? www.nami.org  U.S. [employer] Institute of Mental Health: ? http://www.maynard.net/www.nimh.nih.gov  National Suicide Prevention Lifeline: ? 236 304 41141-272-108-9737. This is free, 24-hour help. Contact a doctor if:  Your symptoms get worse.  You have new symptoms. Get help right away if:  You self-harm.  You see, hear, taste, smell, or feel things that are not present (hallucinate). If you ever feel like you may hurt yourself or others, or have thoughts about taking your own life, get help right away. You can go to your nearest emergency department or call:  Your local emergency services (911 in the [place]  A suicide crisis helpline, such as the National Suicide Prevention Lifeline: ? 787 103 67571-272-108-9737. This is open 24 hours a day.  This information is not intended to replace advice given to you by your health care provider. Make sure you discuss any questions you have with your health care provider. Document Released: 11/12/2015 Document Revised: 08/17/2016 Document Reviewed: 08/17/2016 Elsevier Interactive Patient Education  2017 Elsevier  Inc.  

## 2017-07-08 ENCOUNTER — Encounter: Payer: Self-pay | Admitting: Family Medicine

## 2017-07-08 ENCOUNTER — Ambulatory Visit (HOSPITAL_COMMUNITY)
Admission: RE | Admit: 2017-07-08 | Discharge: 2017-07-08 | Disposition: A | Discharge status: Home / Self Care | Payer: No Typology Code available for payment source | Source: Ambulatory Visit | Attending: Family Medicine | Admitting: Family Medicine

## 2017-07-08 ENCOUNTER — Ambulatory Visit (INDEPENDENT_AMBULATORY_CARE_PROVIDER_SITE_OTHER): Payer: No Typology Code available for payment source | Admitting: Family Medicine

## 2017-07-08 VITALS — BP 124/78 | HR 77 | Temp 98.1°F | Resp 16 | Ht 64.0 in | Wt 159.2 lb

## 2017-07-08 DIAGNOSIS — G8929 Other chronic pain: Secondary | ICD-10-CM

## 2017-07-08 DIAGNOSIS — M545 Low back pain: Secondary | ICD-10-CM | POA: Insufficient documentation

## 2017-07-08 DIAGNOSIS — M25511 Pain in right shoulder: Secondary | ICD-10-CM | POA: Insufficient documentation

## 2017-07-08 LAB — POCT URINALYSIS DIP (DEVICE)
Bilirubin Urine: NEGATIVE
GLUCOSE, UA: NEGATIVE mg/dL
HGB URINE DIPSTICK: NEGATIVE
KETONES UR: NEGATIVE mg/dL
LEUKOCYTES UA: NEGATIVE
Nitrite: NEGATIVE
Protein, ur: NEGATIVE mg/dL
SPECIFIC GRAVITY, URINE: 1.02 (ref 1.005–1.030)
Urobilinogen, UA: 1 mg/dL (ref 0.0–1.0)
pH: 8 (ref 5.0–8.0)

## 2017-07-08 MED ORDER — PREDNISONE 20 MG PO TABS: ORAL_TABLET | ORAL | 0 refills | Status: DC

## 2017-07-08 NOTE — Patient Instructions (Addendum)
Take Prednisone 20 mg,  in mornings with breakfast as follows:   Take 3 pills for a total of 3 days. Take 2 pills for 3 days. Take 1 pill for 3 days.   Complete all medication.  Continue taking Gabapentin and Cyclobenzaprine for pain. Complete the [facility] Financial Assistance form as you will likely need to be referred to a orthopedic specialist. You will be notified of your x-ray results.      Shoulder Pain Many things can cause shoulder pain, including:  An injury.  Moving the arm in the same way again and again (overuse).  Joint pain (arthritis).  Follow these instructions at home: Take these actions to help with your pain:  Squeeze a soft ball or a foam pad as much as you can. This helps to prevent swelling. It also makes the arm stronger.  Take over-the-counter and prescription medicines only as told by your doctor.  If told, put ice on the area: ? Put ice in a plastic bag. ? Place a towel between your skin and the bag. ? Leave the ice on for 20 minutes, 2-3 times per day. Stop putting on ice if it does not help with the pain.  If you were given a shoulder sling or immobilizer: ? Wear it as told. ? Remove it to shower or bathe. ? Move your arm as little as possible. ? Keep your hand moving. This helps prevent swelling.  Contact a doctor if:  Your pain gets worse.  Medicine does not help your pain.  You have new pain in your arm, hand, or fingers. Get help right away if:  Your arm, hand, or fingers: ? Tingle. ? Are numb. ? Are swollen. ? Are painful. ? Turn white or blue. This information is not intended to replace advice given to you by your health care provider. Make sure you discuss any questions you have with your health care provider. Document Released: 05/19/2008 Document Revised: 07/27/2016 Document Reviewed: 03/26/2015 Elsevier Interactive Patient Education  2018 Elsevier Inc.  Back Pain, Adult Back pain is very common. The pain often gets  better over time. The cause of back pain is usually not dangerous. Most people can learn to manage their back pain on their own. Follow these instructions at home: Watch your back pain for any changes. The following actions may help to lessen any pain you are feeling:  Stay active. Start with short walks on flat ground if you can. Try to walk farther each day.  Exercise regularly as told by your doctor. Exercise helps your back heal faster. It also helps avoid future injury by keeping your muscles strong and flexible.  Do not sit, drive, or stand in one place for more than 30 minutes.  Do not stay in bed. Resting more than 1-2 days can slow down your recovery.  Be careful when you bend or lift an object. Use good form when lifting: ? Bend at your knees. ? Keep the object close to your body. ? Do not twist.  Sleep on a firm mattress. Lie on your side, and bend your knees. If you lie on your back, put a pillow under your knees.  Take medicines only as told by your doctor.  Put ice on the injured area. ? Put ice in a plastic bag. ? Place a towel between your skin and the bag. ? Leave the ice on for 20 minutes, 2-3 times a day for the first 2-3 days. After that, you can switch between  ice and heat packs.  Avoid feeling anxious or stressed. Find good ways to deal with stress, such as exercise.  Maintain a healthy weight. Extra weight puts stress on your back.  Contact a doctor if:  You have pain that does not go away with rest or medicine.  You have worsening pain that goes down into your legs or buttocks.  You have pain that does not get better in one week.  You have pain at night.  You lose weight.  You have a fever or chills. Get help right away if:  You cannot control when you poop (bowel movement) or pee (urinate).  Your arms or legs feel weak.  Your arms or legs lose feeling (numbness).  You feel sick to your stomach (nauseous) or throw up (vomit).  You have  belly (abdominal) pain.  You feel like you may pass out (faint). This information is not intended to replace advice given to you by your health care provider. Make sure you discuss any questions you have with your health care provider. Document Released: 05/19/2008 Document Revised: 05/08/2016 Document Reviewed: 04/04/2014 Elsevier Interactive Patient Education  [employer]2018 Elsevier Inc.

## 2017-07-08 NOTE — Progress Notes (Signed)
Patient ID: Drew Jenkins, male    DOB: 08/10/1966, 51 y.o.   MRN: [id number]  PCP: Drew Jenkins, Drew Jenkins  Chief Complaint  Patient presents with  . Back Pain    Subjective:  HPI Drew Jenkins is a 51 y.o. male presents for evaluation of chronic back pain and right shoulder pain. Reports that his back pain and right shoulder pain has worsened over the last several weeks. He is prescribed gabapentin and cyclobenzaprine which has been ineffective in management of his symptoms. Been reports recently his shoulder pain has cause neck pain and increased stiffness. Characterizes both shoulder and back pain as aching to throbbing. Feels that his balance is off and he has almost recently fallen due to the severity of his back pain. Drew Jenkins reports that he has had no prior imaging completed of his back and or shoulder, therefore is unaware of the source of his pain. Social History   Social History  . Marital status: Unknown    Spouse name: N/A  . Number of children: N/A  . Years of education: N/A   Occupational History  . Not on file.   Social History Main Topics  . Smoking status: Never Smoker  . Smokeless tobacco: Never Used  . Alcohol use No  . Drug use: No  . Sexual activity: Not on file     Comment: pt declined condoms   Other Topics Concern  . Not on file   Social History Narrative  . No narrative on file    History reviewed. No pertinent family history. Review of Systems See HPI Patient Active Problem List   Diagnosis Date Noted  . Screening examination for venereal disease 04/09/2017  . Encounter for long-term (current) use of high-risk medication 04/09/2017  . Depression 11/20/2016  . GERD (gastroesophageal reflux disease) 11/20/2016  . Human immunodeficiency virus I infection (HCC) 08/21/2016  . Hypothyroidism 08/21/2016  . Restless leg 08/21/2016  . H/O syphilis 08/21/2016  . Overweight (BMI 25.0-29.9) 08/21/2016  . Abnormal anal Papanicolaou smear 08/21/2016  .  Lumbago 08/21/2016    No Known Allergies  Prior to Admission medications   Medication Sig Start Date End Date Taking? Authorizing Provider  cyclobenzaprine (FLEXERIL) 10 MG tablet Take 1 tablet (10 mg total) by mouth 3 (three) times daily as needed for muscle spasms. 06/16/17  Yes Drew Jenkins, Drew Jenkins  gabapentin (NEURONTIN) 100 MG capsule Take 1 capsule (100 mg total) by mouth 3 (three) times daily. 04/21/17  Yes Drew Jenkins, Drew Jenkins  GENVOYA 150-150-200-10 MG TABS tablet TAKE 1 TABLET BY MOUTH DAILY WITH BREAKFAST 06/11/17  Yes Drew Jenkins, Drew Jenkins  levothyroxine (SYNTHROID, LEVOTHROID) 200 MCG tablet TAKE 1 TABLET BY MOUTH DAILY BEFORE BREAKFAST 03/23/17  Yes Drew Jenkins, Drew Jenkins  ranitidine (ZANTAC) 300 MG tablet TAKE 1 TABLET(300 MG)  BY MOUTH TWICE DAILY 06/16/17  Yes Drew Jenkins, Drew Jenkins  venlafaxine XR (EFFEXOR-XR) 75 MG 24 hr capsule TAKE 2 CAPSULES(150 MG) BY MOUTH DAILY WITH BREAKFAST 06/16/17  Yes Drew Jenkins, Drew Jenkins  Vitamin D, Ergocalciferol, (DRISDOL) 50000 units CAPS capsule Take 1 capsule (50,000 Units total) by mouth every 7 (seven) days. 04/22/17  Yes Drew Jenkins, Averie Hornbaker Jenkins, Drew Jenkins  sertraline (ZOLOFT) 50 MG tablet Take 1 tablet (50 mg total) by mouth daily. Patient not taking: Reported on 07/08/2017 11/20/16   Drew Jenkins, Drew Jenkins    Past Medical, Surgical Family and Social History reviewed and updated.    Objective:   Today'Jenkins Vitals   07/08/17  0822  BP: 124/78  Pulse: 77  Resp: 16  Temp: 98.1 F (36.7 C)  TempSrc: Oral  SpO2: 100%  Weight: 159 lb 3.2 oz (72.2 kg)  Height: 5\' 4"  (1.626 m)    Wt Readings from Last 3 Encounters:  07/08/17 159 lb 3.2 oz (72.2 kg)  06/16/17 159 lb (72.1 kg)  05/05/17 158 lb (71.7 kg)   Physical Exam  Constitutional: He appears well-developed and well-nourished.  HENT:  Head: Normocephalic.  Cardiovascular: Normal rate.   Pulmonary/Chest: Effort normal and breath sounds normal.  Musculoskeletal:       Right shoulder: He exhibits  decreased range of motion and tenderness. He exhibits no bony tenderness, no swelling and no crepitus.       Lumbar back: He exhibits decreased range of motion and tenderness. He exhibits no edema, no deformity and no laceration.  Neurological: He is alert. He has normal strength. No cranial nerve deficit or sensory deficit. GCS eye subscore is 4. GCS verbal subscore is 5. GCS motor subscore is 6.  Reflex Scores:      Patellar reflexes are 1+ on the right side and 1+ on the left side.      Achilles reflexes are 1+ on the right side and 1+ on the left side.  Assessment & Plan:  1. Chronic bilateral low back pain without sciatica 2. Chronic right shoulder pain -Take Prednisone 20 mg,  in mornings with breakfast as follows:  Take 3 pills for 3 days, Take 2 pills for 3 days, and Take 1 pill for 3 days.  Complete all medication. -Continue Cyclobenzaprine and Gabapentin as prescribed for pain.  Orders placed this encounter  - DG Shoulder Right; Future - DG Lumbar Spine Complete  RTC: 1 month for follow-up of back pain  Godfrey PickKimberly Jenkins. Tiburcio PeaHarris, MSN, Drew Jenkins-C [facility]  [address] [place], [place] [address] [phone]

## 2017-07-09 ENCOUNTER — Telehealth: Payer: Self-pay | Admitting: Licensed Clinical Social Worker

## 2017-07-09 NOTE — Telephone Encounter (Signed)
Attempted to contact patient to introduce self and to schedule appointment for first visit.  Was not able to leave message as there was no voicemail.  Drew AlbertsSherry Mary Jenkins, [facility]

## 2017-07-22 ENCOUNTER — Ambulatory Visit: Payer: No Typology Code available for payment source

## 2017-07-29 ENCOUNTER — Ambulatory Visit: Payer: No Typology Code available for payment source

## 2017-08-04 ENCOUNTER — Ambulatory Visit (INDEPENDENT_AMBULATORY_CARE_PROVIDER_SITE_OTHER): Payer: No Typology Code available for payment source | Admitting: Family Medicine

## 2017-08-04 ENCOUNTER — Encounter: Payer: Self-pay | Admitting: Family Medicine

## 2017-08-04 VITALS — BP 126/78 | HR 77 | Temp 98.7°F | Resp 16 | Ht 64.0 in | Wt 155.0 lb

## 2017-08-04 DIAGNOSIS — M545 Low back pain: Secondary | ICD-10-CM

## 2017-08-04 DIAGNOSIS — G8929 Other chronic pain: Secondary | ICD-10-CM

## 2017-08-04 DIAGNOSIS — M25511 Pain in right shoulder: Secondary | ICD-10-CM

## 2017-08-04 MED ORDER — GABAPENTIN 100 MG PO CAPS: 300.0000 mg | ORAL_CAPSULE | Freq: Every day | ORAL | 3 refills | Status: DC

## 2017-08-04 MED ORDER — VITAMIN D (ERGOCALCIFEROL) 1.25 MG (50000 UNIT) PO CAPS: 50000.0000 [IU] | ORAL_CAPSULE | ORAL | 1 refills | Status: DC

## 2017-08-04 MED ORDER — MELOXICAM 15 MG PO TABS
7.5000 mg | ORAL_TABLET | Freq: Every day | ORAL | 0 refills | Status: DC
Start: 2017-08-04 — End: 2018-06-11

## 2017-08-04 NOTE — Patient Instructions (Signed)
Fax [phone], have your attorney fax letter stating request to complete information.

## 2017-08-04 NOTE — Progress Notes (Signed)
Patient ID: Drew Jenkins, male    DOB: [birth year], 51 y.o.   MRN: [id number]  PCP: Drew Neighbors, FNP  Chief Complaint  Patient presents with  . Follow-up    BACK AND SHOULDER PAIN    Subjective:  HPI Drew Jenkins is a 51 y.o. male presents for evaluation of back and shoulder pain. These problems are chronic and not new. Drew Jenkins reports that his right shoulder pain continues to cause him pain and is effecting his ability to achieve a good night's sleep. 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 tShoulder pain right continues cause pain and worse with sleeping. He characterizes pain as aching. Recent images 07/08/2017 of right shoulder were significant for degenerative changes and lumbar spine image was significant for degenerative changes. For both issues he is prescribed Gabapentin which he reports is not improving painful episodes. Drew Jenkins reports associated sciatic pain shooting into both legs. Denies equina cauda  equina symptoms.  He has disability paper work to be completed which one form requiring a functional assessment exam. Drew Jenkins declines a referral to physical therapy to have these tests administered as he is uninsured and does not want to pay out of pocket. Social History   Social History  . Marital status: Unknown    Spouse name: N/A  . Number of children: N/A  . Years of education: N/A   Occupational History  . Not on file.   Social History Main Topics  . Smoking status: Never Smoker  . Smokeless tobacco: Never Used  . Alcohol use No  . Drug use: No  . Sexual activity: Not on file     Comment: pt declined condoms   Other Topics Concern  . Not on file   Social History Narrative  . No narrative on file    Family History  Problem Relation Age of Onset  . Diabetes Mother   . Hypertension Father    Review of Systems See HPI  Patient Active Problem List   Diagnosis Date Noted  . Screening examination for  venereal disease 04/09/2017  . Encounter for long-term (current) use of high-risk medication 04/09/2017  . Depression 11/20/2016  . GERD (gastroesophageal reflux disease) 11/20/2016  . Human immunodeficiency virus I infection (HCC) 08/21/2016  . Hypothyroidism 08/21/2016  . Restless leg 08/21/2016  . H/O syphilis 08/21/2016  . Overweight (BMI 25.0-29.9) 08/21/2016  . Abnormal anal Papanicolaou smear 08/21/2016  . Lumbago 08/21/2016    No Known Allergies  Prior to Admission medications   Medication Sig Start Date End Date Taking? Authorizing Provider  cyclobenzaprine (FLEXERIL) 10 MG tablet Take 1 tablet (10 mg total) by mouth 3 (three) times daily as needed for muscle spasms. 06/16/17  Yes Drew Neighbors, FNP  gabapentin (NEURONTIN) 100 MG capsule Take 1 capsule (100 mg total) by mouth 3 (three) times daily. 04/21/17  Yes Drew Neighbors, FNP  GENVOYA 150-150-200-10 MG TABS tablet TAKE 1 TABLET BY MOUTH DAILY WITH BREAKFAST 06/11/17  Yes Comer, Belia Heman, MD  levothyroxine (SYNTHROID, LEVOTHROID) 200 MCG tablet TAKE 1 TABLET BY MOUTH DAILY BEFORE BREAKFAST 03/23/17  Yes Comer, Belia Heman, MD  ranitidine (ZANTAC) 300 MG tablet TAKE 1 TABLET(300 MG)  BY MOUTH TWICE DAILY 06/16/17  Yes Drew Neighbors, FNP  sertraline (ZOLOFT) 50 MG tablet Take 1 tablet (50 mg total) by mouth daily. 11/20/16  Yes Comer, Belia Heman, MD  venlafaxine XR (EFFEXOR-XR) 75 MG 24 hr capsule  TAKE 2 CAPSULES(150 MG) BY MOUTH DAILY WITH BREAKFAST 06/16/17  Yes Drew Neighbors, FNP  Vitamin D, Ergocalciferol, (DRISDOL) 50000 units CAPS capsule Take 1 capsule (50,000 Units total) by mouth every 7 (seven) days. Patient not taking: Reported on 08/04/2017 04/22/17   Drew Neighbors, FNP    Past Medical, Surgical Family and Social History reviewed and updated.    Objective:   Today's Vitals   08/04/17 0818  BP: 126/78  Pulse: 77  Resp: 16  Temp: 98.7 F (37.1 C)  TempSrc: Oral  SpO2: 100%  Weight: 155 lb (70.3 kg)   Height: 5\' 4"  (1.626 m)    Wt Readings from Last 3 Encounters:  08/04/17 155 lb (70.3 kg)  07/08/17 159 lb 3.2 oz (72.2 kg)  06/16/17 159 lb (72.1 kg)    Physical Exam  Constitutional: He is oriented to person, place, and time. He appears well-developed and well-nourished.  HENT:  Head: Normocephalic and atraumatic.  Eyes: Pupils are equal, round, and reactive to light. Conjunctivae and EOM are normal.  Neck: Normal range of motion. Neck supple.  Cardiovascular: Normal rate, regular rhythm, normal heart sounds and intact distal pulses.   Pulmonary/Chest: Effort normal and breath sounds normal.  Musculoskeletal: Normal range of motion. He exhibits tenderness.  Neurological: He is alert and oriented to person, place, and time.  Reflex Scores:      Brachioradialis reflexes are 2+ on the right side and 2+ on the left side.      Patellar reflexes are 2+ on the right side and 2+ on the left side. Skin: Skin is warm and dry.  Psychiatric: His behavior is normal. Judgment and thought content normal.  Flat appearance    Assessment & Plan:  1. Acute pain of right shoulder - Ambulatory referral to Orthopedic Surgery  2. Chronic right-sided low back pain without sciatica - Ambulatory referral to Orthopedic Surgery  -Complete the [place] patient assistance application.  RTC: 3 months for chronic condition follow-up   Godfrey Pick. Tiburcio Pea, MSN, FNP-C [facility]  [address] [place], [place] [address] [phone]

## 2017-08-07 ENCOUNTER — Encounter: Payer: Self-pay | Admitting: Internal Medicine

## 2017-08-11 ENCOUNTER — Encounter: Payer: Self-pay | Admitting: Internal Medicine

## 2017-08-11 ENCOUNTER — Other Ambulatory Visit (HOSPITAL_COMMUNITY)
Admission: RE | Admit: 2017-08-11 | Discharge: 2017-08-11 | Disposition: A | Discharge status: Home / Self Care | Payer: No Typology Code available for payment source | Source: Ambulatory Visit | Attending: Internal Medicine | Admitting: Internal Medicine

## 2017-08-11 ENCOUNTER — Ambulatory Visit (INDEPENDENT_AMBULATORY_CARE_PROVIDER_SITE_OTHER): Payer: No Typology Code available for payment source | Admitting: Internal Medicine

## 2017-08-11 VITALS — BP 146/84 | HR 77 | Temp 97.9°F | Ht 64.0 in | Wt 156.0 lb

## 2017-08-11 DIAGNOSIS — Z8619 Personal history of other infectious and parasitic diseases: Secondary | ICD-10-CM

## 2017-08-11 DIAGNOSIS — B20 Human immunodeficiency virus [HIV] disease: Secondary | ICD-10-CM

## 2017-08-11 NOTE — Assessment & Plan Note (Signed)
Doing well.  No new issues.  Labs today and rtc 6 months

## 2017-08-11 NOTE — Progress Notes (Signed)
   Subjective:    Patient ID: Drew Jenkins, male    DOB: June 17, 1966, 51 y.o.   MRN: [id number]  HPI Here for follow up of HIV On Genvoya and doing well, denies missed doses.  No associated n/v/d.  Last viral load <20.     Review of Systems  Constitutional: Negative for fatigue.  Gastrointestinal: Negative for diarrhea.  Skin: Negative for rash.       Objective:   Physical Exam  Constitutional: He appears well-developed and well-nourished. No distress.  HENT:  Mouth/Throat: No oropharyngeal exudate.  Eyes: No scleral icterus.  Cardiovascular: Normal rate, regular rhythm and normal heart sounds.   No murmur heard. Pulmonary/Chest: Effort normal and breath sounds normal. No respiratory distress.  Lymphadenopathy:    He has no cervical adenopathy.          Assessment & Plan:

## 2017-08-11 NOTE — Assessment & Plan Note (Signed)
Will recheck titer 

## 2017-08-12 LAB — T-HELPER CELL (CD4) - (RCID CLINIC ONLY)
CD4 T CELL ABS: 820 /uL (ref 400–2700)
CD4 T CELL HELPER: 22 % — AB (ref 33–55)

## 2017-08-12 LAB — URINE CYTOLOGY ANCILLARY ONLY
CHLAMYDIA, DNA PROBE: NEGATIVE
Neisseria Gonorrhea: NEGATIVE

## 2017-08-12 LAB — RPR: RPR: REACTIVE — AB

## 2017-08-12 LAB — FLUORESCENT TREPONEMAL AB(FTA)-IGG-BLD: Fluorescent Treponemal ABS: REACTIVE — AB

## 2017-08-12 LAB — RPR TITER: RPR Titer: 1:4 {titer}

## 2017-08-13 LAB — HIV-1 RNA QUANT-NO REFLEX-BLD
HIV 1 RNA Quant: 60 copies/mL — ABNORMAL HIGH
HIV-1 RNA QUANT, LOG: 1.78 {Log_copies}/mL — AB

## 2017-09-08 ENCOUNTER — Other Ambulatory Visit (INDEPENDENT_AMBULATORY_CARE_PROVIDER_SITE_OTHER): Payer: Self-pay | Admitting: Orthopedic Surgery

## 2017-09-11 ENCOUNTER — Ambulatory Visit: Payer: No Typology Code available for payment source | Admitting: Family Medicine

## 2017-09-28 ENCOUNTER — Encounter: Payer: Self-pay | Admitting: Family Medicine

## 2017-09-28 NOTE — Patient Instructions (Signed)
Informed pt that information would be sent to provider for follow-up

## 2017-09-29 ENCOUNTER — Ambulatory Visit: Payer: No Typology Code available for payment source | Admitting: Family Medicine

## 2017-09-30 ENCOUNTER — Ambulatory Visit: Payer: No Typology Code available for payment source | Admitting: Family Medicine

## 2017-10-09 ENCOUNTER — Other Ambulatory Visit: Payer: Self-pay | Admitting: Internal Medicine

## 2017-10-09 ENCOUNTER — Other Ambulatory Visit: Payer: Self-pay | Admitting: Family Medicine

## 2017-10-09 DIAGNOSIS — B20 Human immunodeficiency virus [HIV] disease: Secondary | ICD-10-CM

## 2017-10-29 ENCOUNTER — Other Ambulatory Visit: Payer: Self-pay | Admitting: Internal Medicine

## 2017-10-29 DIAGNOSIS — E039 Hypothyroidism, unspecified: Secondary | ICD-10-CM

## 2017-11-09 ENCOUNTER — Ambulatory Visit: Payer: No Typology Code available for payment source | Admitting: Family Medicine

## 2017-11-12 ENCOUNTER — Other Ambulatory Visit: Payer: Self-pay | Admitting: Family Medicine

## 2017-11-18 ENCOUNTER — Telehealth: Payer: Self-pay

## 2017-11-18 MED ORDER — RANITIDINE HCL 300 MG PO TABS: ORAL_TABLET | ORAL | 0 refills | Status: DC

## 2017-11-18 NOTE — Telephone Encounter (Signed)
Medication sent to [facility]

## 2017-11-20 ENCOUNTER — Ambulatory Visit (INDEPENDENT_AMBULATORY_CARE_PROVIDER_SITE_OTHER): Payer: Self-pay | Admitting: Family Medicine

## 2017-11-20 ENCOUNTER — Other Ambulatory Visit: Payer: Self-pay | Admitting: Family Medicine

## 2017-11-20 ENCOUNTER — Encounter: Payer: Self-pay | Admitting: Family Medicine

## 2017-11-20 VITALS — BP 123/77 | HR 90 | Temp 98.0°F | Resp 14 | Ht 64.0 in | Wt 160.0 lb

## 2017-11-20 DIAGNOSIS — Z5321 Procedure and treatment not carried out due to patient leaving prior to being seen by health care provider: Secondary | ICD-10-CM

## 2017-11-20 NOTE — Progress Notes (Signed)
At 11:20 provider was about to enter the room, patient left and told front office staff that he would not schedule any future appointments.    Godfrey PickKimberly S. Tiburcio PeaHarris, MSN, FNP-C [facility]  [address] [place], [place] [address] [phone]

## 2017-11-27 ENCOUNTER — Ambulatory Visit (INDEPENDENT_AMBULATORY_CARE_PROVIDER_SITE_OTHER): Payer: BC Managed Care – PPO | Admitting: Family Medicine

## 2017-11-27 ENCOUNTER — Encounter: Payer: Self-pay | Admitting: Family Medicine

## 2017-11-27 VITALS — BP 120/76 | HR 86 | Temp 98.9°F | Ht 64.0 in | Wt 163.0 lb

## 2017-11-27 DIAGNOSIS — M5442 Lumbago with sciatica, left side: Secondary | ICD-10-CM

## 2017-11-27 DIAGNOSIS — M5441 Lumbago with sciatica, right side: Secondary | ICD-10-CM | POA: Diagnosis not present

## 2017-11-27 DIAGNOSIS — E039 Hypothyroidism, unspecified: Secondary | ICD-10-CM | POA: Diagnosis not present

## 2017-11-27 DIAGNOSIS — M25511 Pain in right shoulder: Secondary | ICD-10-CM

## 2017-11-27 DIAGNOSIS — G8929 Other chronic pain: Secondary | ICD-10-CM | POA: Diagnosis not present

## 2017-11-27 MED ORDER — GABAPENTIN 300 MG PO CAPS: 300.0000 mg | ORAL_CAPSULE | Freq: Three times a day (TID) | ORAL | 1 refills | Status: DC

## 2017-11-27 NOTE — Progress Notes (Signed)
Patient ID: Drew Jenkins, male    DOB: 12-Jul-1966, 51 y.o.   MRN: [id number]  PCP: Bing Neighbors, FNP  Chief Complaint  Patient presents with  . Follow-up    3 MONTH    Subjective:  HPI Drew Jenkins is a 51 y.o. male presents for evaluation of chronic condition. Medical problems include HIV, GERD, Hypothyroidism, HSV, chronic back pain, and degenerative joint disease of shoulder. Drew Jenkins was previously uninsured and now has insurance and requests referrals to orthopedics and spine specialist.   Chronic back pain Drew Jenkins underwent a lumbar spine x-ray 07/08/2017 which was significant for "diffuse mild degenerative changes". He has trial and failed Gabapentin, NSAIDS, and Prednisone. He reports pain remains constant and predominately localized to his lower back. Drew Jenkins reports no known history of injury or MVA. Back pain has remained present over the course of several years and he reports is worsening over time and impacting ADLs.  Right Shoulder Pain Drew Jenkins reports worsening right shoulder pain. He had a right shoulder x-ray 07/08/2017 which was significant for mild acromioclavicular glenohumeral degenerative change with mild subacromial spurring. He reports pain is exacerbated by driving, lifting objects, and lying on his right side. He was prescribed Gabapentin twice daily for pain without significant relief.  He is requesting a referral to orthopedics. Social History   Socioeconomic History  . Marital status: Unknown    Spouse name: Not on file  . Number of children: Not on file  . Years of education: Not on file  . Highest education level: Not on file  Social Needs  . Financial resource strain: Not on file  . Food insecurity - worry: Not on file  . Food insecurity - inability: Not on file  . Transportation needs - medical: Not on file  . Transportation needs - non-medical: Not on file  Occupational History  . Not on file  Tobacco Use  . Smoking status: Never Smoker  . Smokeless  tobacco: Never Used  Substance and Sexual Activity  . Alcohol use: No  . Drug use: No  . Sexual activity: Not on file    Comment: pt declined condoms  Other Topics Concern  . Not on file  Social History Narrative  . Not on file    Family History  Problem Relation Age of Onset  . Diabetes Mother   . Hypertension Father    Review of Systems  Constitutional: Negative.   Respiratory: Negative.   Cardiovascular: Negative.   Musculoskeletal: Positive for arthralgias and back pain.  Neurological: Negative.   Psychiatric/Behavioral: Negative.     Patient Active Problem List   Diagnosis Date Noted  . Screening examination for venereal disease 04/09/2017  . Encounter for long-term (current) use of high-risk medication 04/09/2017  . Depression 11/20/2016  . GERD (gastroesophageal reflux disease) 11/20/2016  . Human immunodeficiency virus I infection (HCC) 08/21/2016  . Hypothyroidism 08/21/2016  . Restless leg 08/21/2016  . H/O syphilis 08/21/2016  . Overweight (BMI 25.0-29.9) 08/21/2016  . Abnormal anal Papanicolaou smear 08/21/2016  . Lumbago 08/21/2016    No Known Allergies  Prior to Admission medications   Medication Sig Start Date End Date Taking? Authorizing Provider  Ascorbic Acid (VITAMIN C) 1000 MG tablet Take 1,000 mg by mouth once a week.   Yes [provider]  gabapentin (NEURONTIN) 100 MG capsule Take 3 capsules (300 mg total) by mouth at bedtime. 08/04/17  Yes Bing Neighbors, FNP  GENVOYA 150-150-200-10 MG TABS tablet TAKE 1 TABLET BY MOUTH DAILY  WITH BREAKFAST 10/12/17  Yes Comer, Belia Hemanobert W, MD  levothyroxine (SYNTHROID, LEVOTHROID) 200 MCG tablet TAKE 1 TABLET BY MOUTH DAILY BEFORE BREAKFAST 03/23/17  Yes Comer, Belia Hemanobert W, MD  mirtazapine (REMERON) 15 MG tablet TAKE 1/2 TABLET BY MOUTH AT BEDTIME FOR SLEEP 10/12/17  Yes Bing NeighborsHarris, Shakena Callari S, FNP  omeprazole (PRILOSEC) 20 MG capsule Take 20 mg by mouth 2 (two) times daily. 09/05/14  Yes [provider]  venlafaxine XR (EFFEXOR-XR) 75 MG 24 hr capsule TAKE 2 CAPSULES(150 MG) BY MOUTH DAILY WITH BREAKFAST 06/16/17  Yes Bing NeighborsHarris, Karina Lenderman S, FNP  Vitamin D, Ergocalciferol, (DRISDOL) 50000 units CAPS capsule Take 1 capsule (50,000 Units total) by mouth every 7 (seven) days. 08/04/17  Yes Bing NeighborsHarris, Dalal Livengood S, FNP  meloxicam (MOBIC) 15 MG tablet Take 0.5 tablets (7.5 mg total) by mouth daily. Patient not taking: Reported on 11/27/2017 08/04/17   Bing NeighborsHarris, Medrith Veillon S, FNP  ranitidine (ZANTAC) 300 MG tablet TAKE 1 TABLET(300 MG) BY MOUTH TWICE DAILY Patient not taking: Reported on 11/27/2017 11/18/17   Bing NeighborsHarris, Journie Howson S, FNP    Past Medical, Surgical Family and Social History reviewed and updated.    Objective:   Today's Vitals   11/27/17 1309  BP: 120/76  Pulse: 86  Temp: 98.9 F (37.2 C)  TempSrc: Oral  SpO2: 98%  Weight: 163 lb (73.9 kg)  Height: 5\' 4"  (1.626 m)    Wt Readings from Last 3 Encounters:  11/27/17 163 lb (73.9 kg)  11/20/17 160 lb (72.6 kg)  08/11/17 156 lb (70.8 kg)    Physical Exam  Constitutional: He appears well-developed and well-nourished.  HENT:  Head: Normocephalic and atraumatic.  Neck: Normal range of motion. Neck supple.  Cardiovascular: Normal rate, regular rhythm, normal heart sounds and intact distal pulses.  Pulmonary/Chest: Effort normal and breath sounds normal.  Musculoskeletal:       Right shoulder: He exhibits decreased range of motion and bony tenderness. He exhibits no swelling and no crepitus.       Lumbar back: He exhibits normal range of motion, no tenderness and no bony tenderness.       Arms: Skin: Skin is warm and dry.  Psychiatric: He has a normal mood and affect. His behavior is normal. Judgment and thought content normal.   Assessment & Plan:  1. Chronic bilateral low back pain with bilateral sciatica, this has remained chronic ongoing problem which has not improved with prior prednisone tapers, NSAIDS, and or Gabapentin. Referring to  neurosurgery for further evaluation and management. -For now continue Meloxicam and increased Gabapentin  300 mg, 3 times daily.  2. Acute pain of right shoulder, progressing. Failed conservative therapies and has limited ROM right shoulder. Imaging significant for degenerative changes.  Will refer to Orthopedic Surgery.  3. Hypothyroidism, unspecified type, chronic ongoing. Last TSH 4.31.  Will repeat thyroid panel today.  Meds ordered this encounter  Medications  . gabapentin (NEURONTIN) 300 MG capsule    Sig: Take 1 capsule (300 mg total) by mouth 3 (three) times daily.    Dispense:  90 capsule    Refill:  1    Dose change no refills needed    Order Specific Question:   Supervising Provider    Answer:   Quentin AngstJEGEDE, OLUGBEMIGA E [[id number]]     Orders Placed This Encounter  Procedures  . Thyroid Panel With TSH  . Ambulatory referral to Neurosurgery  . Ambulatory referral to Orthopedic Surgery    RTC: 6 months for chronic condition follow-up.  Godfrey PickKimberly S. Tiburcio PeaHarris, MSN, FNP-C [facility]  [address] [place], [place] [address] [phone]

## 2017-11-28 LAB — THYROID PANEL WITH TSH
Free Thyroxine Index: 1.9 (ref 1.2–4.9)
T3 UPTAKE RATIO: 25 % (ref 24–39)
T4, Total: 7.5 ug/dL (ref 4.5–12.0)
TSH: 3.74 u[IU]/mL (ref 0.450–4.500)

## 2017-11-29 ENCOUNTER — Encounter: Payer: Self-pay | Admitting: Family Medicine

## 2017-11-29 NOTE — Progress Notes (Signed)
Please mail results letter

## 2017-12-05 ENCOUNTER — Other Ambulatory Visit: Payer: Self-pay | Admitting: Family Medicine

## 2017-12-05 ENCOUNTER — Other Ambulatory Visit: Payer: Self-pay | Admitting: Internal Medicine

## 2017-12-05 DIAGNOSIS — E039 Hypothyroidism, unspecified: Secondary | ICD-10-CM

## 2017-12-10 ENCOUNTER — Ambulatory Visit (INDEPENDENT_AMBULATORY_CARE_PROVIDER_SITE_OTHER): Payer: BC Managed Care – PPO | Admitting: Orthopaedic Surgery

## 2017-12-10 DIAGNOSIS — G8929 Other chronic pain: Secondary | ICD-10-CM

## 2017-12-10 DIAGNOSIS — M25511 Pain in right shoulder: Secondary | ICD-10-CM | POA: Diagnosis not present

## 2017-12-10 NOTE — Progress Notes (Signed)
Office Visit Note   Patient: Drew Jenkins           Date of Birth: 09/21/1966           MRN: [id number] Visit Date: 12/10/2017              Requested by: Bing NeighborsHarris, Kimberly S, FNP [address] [place], [place] [address] PCP: Bing NeighborsHarris, Kimberly S, FNP   Assessment & Plan: Visit Diagnoses:  1. Chronic right shoulder pain     Plan: today we proceeded with a right subacromial injection as detailed in procedure below.  We also provided patient with a jobe exercises program.  He will call in two weeks if he is not any better, and we will proceed with an MRI of his right shoulder to assess his rotator cuff.    Follow-Up Instructions: Return if symptoms worsen or fail to improve.   Orders:  No orders of the defined types were placed in this encounter.  No orders of the defined types were placed in this encounter.     Procedures: Large Joint Inj: R subacromial bursa on 12/10/2017 10:54 AM Indications: pain Details: 22 G needle Medications: 2 mL lidocaine 2 %; 2 mL bupivacaine 0.25 %; 40 mg methylPREDNISolone acetate 40 MG/ML Outcome: tolerated well, no immediate complications Patient was prepped and draped in the usual sterile fashion.       Clinical Data: No additional findings.   Subjective: No chief complaint on file.   HPI  This is a pleasant gentleman who comes in today with complaints of right shoulder pain.  This started about one year ago without injury or change in activity.  The pain is described as a dull ache and is located over the deltoid as well as the lateral aspect of his neck.  Pain is worse with shoulder abduction, forward flexion and internal rotation.  He also has pain sleeping on the right side.  He has tried nsaids without relief of symptoms.  No radicular symptoms noted.  He does not have a history of previous shoulder pathology, cortisone injection or surgical intervention.    Review of Systems as detailed in HPI, all others are negative.      Objective: Vital Signs: There were no vitals taken for this visit.  Physical Exam:  Well developed, well nourished gentleman in no acute distress.  Alert and oriented x 3.    Ortho Exam right shoulder:  Full AROM with forward flexion and abduction.  He can internally rotate to T12.  Positive empty can and crossbody test.  Negative drop arm.  He is neurovascularly intact distally.  Specialty Comments:  No specialty comments available.  Imaging: Previous imaging from 06/2017 reviewed by me in canopy reveals mild AC/GH arthritis, type 3 acromion  PMFS History: Patient Active Problem List   Diagnosis Date Noted  . Screening examination for venereal disease 04/09/2017  . Encounter for long-term (current) use of high-risk medication 04/09/2017  . Depression 11/20/2016  . GERD (gastroesophageal reflux disease) 11/20/2016  . Human immunodeficiency virus I infection (HCC) 08/21/2016  . Hypothyroidism 08/21/2016  . Restless leg 08/21/2016  . H/O syphilis 08/21/2016  . Overweight (BMI 25.0-29.9) 08/21/2016  . Abnormal anal Papanicolaou smear 08/21/2016  . Lumbago 08/21/2016   Past Medical History:  Diagnosis Date  . HIV infection (HCC)     Family History  Problem Relation Age of Onset  . Diabetes Mother   . Hypertension Father     No past surgical history on file.  Social History   Occupational History  . Not on file  Tobacco Use  . Smoking status: Never Smoker  . Smokeless tobacco: Never Used  Substance and Sexual Activity  . Alcohol use: No  . Drug use: No  . Sexual activity: Not on file    Comment: pt declined condoms

## 2017-12-11 MED ORDER — LIDOCAINE HCL 2 % IJ SOLN
2.0000 mL | INTRAMUSCULAR | Status: AC | PRN
  Administered 2017-12-10: 2 mL

## 2017-12-11 MED ORDER — METHYLPREDNISOLONE ACETATE 40 MG/ML IJ SUSP
40.0000 mg | INTRAMUSCULAR | Status: AC | PRN
Start: 2017-12-10 — End: 2017-12-10
  Administered 2017-12-10: 40 mg via INTRA_ARTICULAR

## 2017-12-11 MED ORDER — BUPIVACAINE HCL 0.25 % IJ SOLN
2.0000 mL | INTRAMUSCULAR | Status: AC | PRN
  Administered 2017-12-10: 2 mL via INTRA_ARTICULAR

## 2017-12-17 ENCOUNTER — Other Ambulatory Visit: Payer: Self-pay | Admitting: Family Medicine

## 2017-12-17 ENCOUNTER — Other Ambulatory Visit: Payer: Self-pay

## 2017-12-17 DIAGNOSIS — E039 Hypothyroidism, unspecified: Secondary | ICD-10-CM

## 2017-12-17 NOTE — Telephone Encounter (Signed)
Patient called stating that he had flu like symptoms and that he doesn't have any money to get medication. Patient was advise that he would need to come in for a appointment as we can't diagnose him over the phone. Patient was advise also to go to urgent care if he couldn't get into see us.

## 2017-12-24 MED ORDER — LEVOTHYROXINE SODIUM 200 MCG PO TABS: ORAL_TABLET | ORAL | 5 refills | Status: DC

## 2017-12-24 NOTE — Telephone Encounter (Signed)
Patient is requesting the Levothyroxine be refilled. You didn't prescribe this medication so wanted to make sure it was ok to refill.

## 2018-01-15 ENCOUNTER — Ambulatory Visit: Payer: No Typology Code available for payment source | Admitting: Family Medicine

## 2018-01-28 ENCOUNTER — Ambulatory Visit (INDEPENDENT_AMBULATORY_CARE_PROVIDER_SITE_OTHER): Payer: BC Managed Care – PPO | Admitting: Family Medicine

## 2018-01-28 ENCOUNTER — Encounter: Payer: Self-pay | Admitting: Family Medicine

## 2018-01-28 VITALS — BP 121/77 | HR 89 | Temp 98.2°F | Resp 16 | Ht 64.0 in | Wt 165.0 lb

## 2018-01-28 DIAGNOSIS — R05 Cough: Secondary | ICD-10-CM

## 2018-01-28 DIAGNOSIS — J029 Acute pharyngitis, unspecified: Secondary | ICD-10-CM

## 2018-01-28 DIAGNOSIS — J069 Acute upper respiratory infection, unspecified: Secondary | ICD-10-CM | POA: Diagnosis not present

## 2018-01-28 DIAGNOSIS — R059 Cough, unspecified: Secondary | ICD-10-CM

## 2018-01-28 LAB — POCT RAPID STREP A (OFFICE): Rapid Strep A Screen: NEGATIVE

## 2018-01-28 MED ORDER — AZITHROMYCIN 250 MG PO TABS: ORAL_TABLET | ORAL | 0 refills | Status: DC

## 2018-01-28 MED ORDER — BENZONATATE 100 MG PO CAPS: 100.0000 mg | ORAL_CAPSULE | Freq: Three times a day (TID) | ORAL | 0 refills | Status: DC | PRN

## 2018-01-28 NOTE — Progress Notes (Signed)
Subjective:     Drew Jenkins is a 52 y.o. male who presents for evaluation of upper respiratory symptoms over the past 3 weeks. Patient reports that symptoms initially began 3 weeks ago with headache, congestion, cough and sore throat. He says that he has been taking OTC antihistamine and analgesics without satisfactory improvement. Chest congestion and sore throat present. Drew Jenkins endorses pain with swallowing. He also says that it is difficult to sleep due to nasal and chest congestion. Patient is tolerating fluids. He currently denies fever, chills, ear pain, ear pressure, or night sweats. Patient has had multiple sick contacts, he drives a bus.  Current Meds  Medication Sig  . gabapentin (NEURONTIN) 300 MG capsule Take 1 capsule (300 mg total) by mouth 3 (three) times daily.  . GENVOYA 150-150-200-10 MG TABS tablet TAKE 1 TABLET BY MOUTH DAILY WITH BREAKFAST  . levothyroxine (SYNTHROID, LEVOTHROID) 200 MCG tablet TAKE 1 TABLET BY MOUTH DAILY BEFORE BREAKFAST  . meloxicam (MOBIC) 15 MG tablet Take 0.5 tablets (7.5 mg total) by mouth daily.  . mirtazapine (REMERON) 15 MG tablet TAKE 1/2 TABLET BY MOUTH AT BEDTIME FOR SLEEP  . omeprazole (PRILOSEC) 20 MG capsule Take 20 mg by mouth 2 (two) times daily.  . ranitidine (ZANTAC) 150 MG tablet TAKE 1 TABLET(150 MG) BY MOUTH TWICE DAILY  . ranitidine (ZANTAC) 300 MG tablet TAKE 1 TABLET(300 MG) BY MOUTH TWICE DAILY  . venlafaxine XR (EFFEXOR-XR) 75 MG 24 hr capsule TAKE 2 CAPSULES(150 MG) BY MOUTH DAILY WITH BREAKFAST  . Vitamin D, Ergocalciferol, (DRISDOL) 50000 units CAPS capsule Take 1 capsule (50,000 Units total) by mouth every 7 (seven) days.   Past Medical History:  Diagnosis Date  . HIV infection [facility])    Social History   Socioeconomic History  . Marital status: Unknown    Spouse name: Not on file  . Number of children: Not on file  . Years of education: Not on file  . Highest education level: Not on file  Social Needs  . Financial  resource strain: Not on file  . Food insecurity - worry: Not on file  . Food insecurity - inability: Not on file  . Transportation needs - medical: Not on file  . Transportation needs - non-medical: Not on file  Occupational History  . Not on file  Tobacco Use  . Smoking status: Never Smoker  . Smokeless tobacco: Never Used  Substance and Sexual Activity  . Alcohol use: No  . Drug use: No  . Sexual activity: Not on file    Comment: pt declined condoms  Other Topics Concern  . Not on file  Social History Narrative  . Not on file   Immunization History  Administered Date(s) Administered  . DTaP 03/18/2011  . Hep A / Hep B 04/22/2011, 05/29/2015  . PPD Test 10/10/2016  . Pneumococcal Conjugate-13 12/15/2001  . Tdap 04/25/2013   Review of Systems  Constitutional: Negative for malaise/fatigue and weight loss.  HENT: Positive for congestion and sore throat. Negative for ear pain and sinus pain.   Eyes: Negative.   Respiratory: Positive for cough.   Gastrointestinal: Negative.   Genitourinary: Negative.   Musculoskeletal: Negative.   Skin: Negative.   Neurological: Negative.   Endo/Heme/Allergies: Negative.   Psychiatric/Behavioral: Negative.     Objective:   Physical Exam  Constitutional: He is oriented to person, place, and time. He appears ill.  HENT:  Right Ear: Tympanic membrane is erythematous.  Left Ear: Tympanic membrane is erythematous.  Nose: Mucosal edema present. Right sinus exhibits no frontal sinus tenderness. Left sinus exhibits no frontal sinus tenderness.  Mouth/Throat: Oropharyngeal exudate and posterior oropharyngeal edema present.  Cardiovascular: Normal rate and regular rhythm.  Pulmonary/Chest: Effort normal and breath sounds normal.  Abdominal: Soft. Bowel sounds are normal.  Neurological: He is alert and oriented to person, place, and time.   Assessment:   BP 121/77 (BP Location: Left Arm, Patient Position: Sitting, Cuff Size: Normal)    Pulse 89   Temp 98.2 F (36.8 C) (Oral)   Resp 16   Ht 5\' 4"  (1.626 m)   Wt 165 lb (74.8 kg)   SpO2 99%   BMI 28.32 kg/m   Plan:  1. Upper respiratory tract infection, unspecified type Symptoms have been present for 3 weeks. Patient warrants antibiotic therapy. Will start azithromycin for  5 days.  Recommend increasing rest, handwashing, and fluid intake.  - azithromycin (ZITHROMAX) 250 MG tablet; Take 500 mg today and 250 mg on days 2-5.  Dispense: 6 tablet; Refill: 0 - benzonatate (TESSALON PERLES) 100 MG capsule; Take 1 capsule (100 mg total) by mouth 3 (three) times daily as needed for cough.  Dispense: 30 capsule; Refill: 0  2. Cough - benzonatate (TESSALON PERLES) 100 MG capsule; Take 1 capsule (100 mg total) by mouth 3 (three) times daily as needed for cough.  Dispense: 30 capsule; Refill: 0  3. Sore throat Rapid strep negative. Recommend Tylenol 500 mg every 8 hours as needed for mild to moderate throat pain.  Also, recommend salt water gargles as needed throughout the day.  - Rapid Strep A - Culture, Group A Strep  Discussed diagnosis and treatment of URI. Suggested symptomatic OTC remedies. Nasal saline spray for congestion. Follow up as needed.      RTC: Follow up as scheduled with Joaquin CourtsKimberly Harris, FNP   The patient was given clear instructions to go to ER or return to medical center if symptoms do not improve, worsen or new problems develop. The patient verbalized understanding.     Nolon NationsLachina Moore Brittain Hosie  MSN, FNP-C Patient Care [facility] [address]  [place], [place] [address] [phone]

## 2018-01-28 NOTE — Patient Instructions (Addendum)
For upper respiratory infection will start azithromycin 500 mg once a day and on days 2 through 5 250 mg.  You have a persistent cough, will send in Tessalon Perles 100 mg every 8 hours as needed.  Increase rest, handwashing, and fluid intake.  Follow-up in office as previously scheduled  Your strep culture is negative. Recommend salt water gargles as needed. Also, Tylenol 500 mg every 8 hours as needed for mild to moderate throat pain.    The patient was given clear instructions to go to ER or return to medical center if symptoms do not improve, worsen or new problems develop. The patient verbalized understanding.    Upper Respiratory Infection, Adult Most upper respiratory infections (URIs) are caused by a virus. A URI affects the nose, throat, and upper air passages. The most common type of URI is often called "the common cold." Follow these instructions at home:  Take medicines only as told by your doctor.  Gargle warm saltwater or take cough drops to comfort your throat as told by your doctor.  Use a warm mist humidifier or inhale steam from a shower to increase air moisture. This may make it easier to breathe.  Drink enough fluid to keep your pee (urine) clear or pale yellow.  Eat soups and other clear broths.  Have a healthy diet.  Rest as needed.  Go back to work when your fever is gone or your doctor says it is okay. ? You may need to stay home longer to avoid giving your URI to others. ? You can also wear a face mask and wash your hands often to prevent spread of the virus.  Use your inhaler more if you have asthma.  Do not use any tobacco products, including cigarettes, chewing tobacco, or electronic cigarettes. If you need help quitting, ask your doctor. Contact a doctor if:  You are getting worse, not better.  Your symptoms are not helped by medicine.  You have chills.  You are getting more short of breath.  You have brown or red mucus.  You have yellow or  brown discharge from your nose.  You have pain in your face, especially when you bend forward.  You have a fever.  You have puffy (swollen) neck glands.  You have pain while swallowing.  You have white areas in the back of your throat. Get help right away if:  You have very bad or constant: ? Headache. ? Ear pain. ? Pain in your forehead, behind your eyes, and over your cheekbones (sinus pain). ? Chest pain.  You have long-lasting (chronic) lung disease and any of the following: ? Wheezing. ? Long-lasting cough. ? Coughing up blood. ? A change in your usual mucus.  You have a stiff neck.  You have changes in your: ? Vision. ? Hearing. ? Thinking. ? Mood. This information is not intended to replace advice given to you by your health care provider. Make sure you discuss any questions you have with your health care provider. Document Released: 05/19/2008 Document Revised: 08/03/2016 Document Reviewed: 03/08/2014 Elsevier Interactive Patient Education  2018 [employer]

## 2018-01-31 LAB — CULTURE, GROUP A STREP: Strep A Culture: NEGATIVE

## 2018-02-01 DIAGNOSIS — J069 Acute upper respiratory infection, unspecified: Secondary | ICD-10-CM | POA: Insufficient documentation

## 2018-02-09 ENCOUNTER — Other Ambulatory Visit: Payer: Self-pay | Admitting: Family Medicine

## 2018-02-16 ENCOUNTER — Other Ambulatory Visit: Payer: Self-pay | Admitting: *Deleted

## 2018-02-16 ENCOUNTER — Other Ambulatory Visit: Payer: No Typology Code available for payment source

## 2018-02-16 DIAGNOSIS — B20 Human immunodeficiency virus [HIV] disease: Secondary | ICD-10-CM

## 2018-02-16 DIAGNOSIS — Z113 Encounter for screening for infections with a predominantly sexual mode of transmission: Secondary | ICD-10-CM

## 2018-02-17 ENCOUNTER — Telehealth: Payer: Self-pay

## 2018-02-17 NOTE — Telephone Encounter (Signed)
Patient states that insurance will not cover the Effexor and needs a different medication. Also that the Zantac is not working anymore. Please advise

## 2018-02-18 ENCOUNTER — Telehealth: Payer: Self-pay | Admitting: *Deleted

## 2018-02-18 MED ORDER — PANTOPRAZOLE SODIUM 20 MG PO TBEC: 20.0000 mg | DELAYED_RELEASE_TABLET | Freq: Two times a day (BID) | ORAL | 3 refills | Status: DC

## 2018-02-18 MED ORDER — FLUOXETINE HCL 20 MG PO TABS: 20.0000 mg | ORAL_TABLET | Freq: Every day | ORAL | 0 refills | Status: DC

## 2018-02-18 NOTE — Telephone Encounter (Signed)
Patient called with questions regarding insurance, specialty pharmacies, copay assistance. He will call back with more details. He is also asking for Tammy to give him a call back. Andree CossHowell, Roshawn Lacina M, RN

## 2018-02-18 NOTE — Telephone Encounter (Signed)
Contact patient to advise-  I have discontinued Zantac.    Start Protonix 20 mg twice daily.  I have discontinued Effexor.   He will start Prozac 20 mg once daily . He will need to return in 2 months for medication follow-up.     Godfrey PickKimberly S. Tiburcio PeaHarris, MSN, FNP-C [facility]  [address] [place], [place] [address] [phone]

## 2018-02-18 NOTE — Telephone Encounter (Signed)
Patient notified and will pick-up medications

## 2018-02-25 ENCOUNTER — Other Ambulatory Visit: Payer: BC Managed Care – PPO

## 2018-02-25 DIAGNOSIS — Z79899 Other long term (current) drug therapy: Secondary | ICD-10-CM

## 2018-02-25 DIAGNOSIS — B20 Human immunodeficiency virus [HIV] disease: Secondary | ICD-10-CM

## 2018-02-25 DIAGNOSIS — Z113 Encounter for screening for infections with a predominantly sexual mode of transmission: Secondary | ICD-10-CM

## 2018-02-26 LAB — CBC WITH DIFFERENTIAL/PLATELET
BASOS ABS: 52 {cells}/uL (ref 0–200)
Basophils Relative: 0.7 %
EOS ABS: 244 {cells}/uL (ref 15–500)
Eosinophils Relative: 3.3 %
HCT: 40.2 % (ref 38.5–50.0)
Hemoglobin: 14.5 g/dL (ref 13.2–17.1)
Lymphs Abs: 3441 cells/uL (ref 850–3900)
MCH: 30.7 pg (ref 27.0–33.0)
MCHC: 36.1 g/dL — AB (ref 32.0–36.0)
MCV: 85 fL (ref 80.0–100.0)
MONOS PCT: 8.7 %
MPV: 9.4 fL (ref 7.5–12.5)
NEUTROS PCT: 40.8 %
Neutro Abs: 3019 cells/uL (ref 1500–7800)
PLATELETS: 326 10*3/uL (ref 140–400)
RBC: 4.73 10*6/uL (ref 4.20–5.80)
RDW: 13 % (ref 11.0–15.0)
Total Lymphocyte: 46.5 %
WBC mixed population: 644 cells/uL (ref 200–950)
WBC: 7.4 10*3/uL (ref 3.8–10.8)

## 2018-02-26 LAB — COMPLETE METABOLIC PANEL WITH GFR
AG RATIO: 1.5 (calc) (ref 1.0–2.5)
ALBUMIN MSPROF: 4.4 g/dL (ref 3.6–5.1)
ALKALINE PHOSPHATASE (APISO): 76 U/L (ref 40–115)
ALT: 33 U/L (ref 9–46)
AST: 23 U/L (ref 10–35)
BUN: 11 mg/dL (ref 7–25)
CO2: 29 mmol/L (ref 20–32)
CREATININE: 1.12 mg/dL (ref 0.70–1.33)
Calcium: 9.5 mg/dL (ref 8.6–10.3)
Chloride: 105 mmol/L (ref 98–110)
GFR, EST NON AFRICAN AMERICAN: 75 mL/min/{1.73_m2} (ref >=60)
GFR, Est African American: 86 mL/min/{1.73_m2} (ref >=60)
GLOBULIN: 2.9 g/dL (ref 1.9–3.7)
Glucose, Bld: 90 mg/dL (ref 65–99)
POTASSIUM: 4.3 mmol/L (ref 3.5–5.3)
SODIUM: 140 mmol/L (ref 135–146)
Total Bilirubin: 0.9 mg/dL (ref 0.2–1.2)
Total Protein: 7.3 g/dL (ref 6.1–8.1)

## 2018-02-26 LAB — LIPID PANEL
CHOL/HDL RATIO: 4.7 (calc) (ref <5.0)
CHOLESTEROL: 168 mg/dL (ref <200)
HDL: 36 mg/dL — ABNORMAL LOW (ref >40)
LDL Cholesterol (Calc): 96 mg/dL (calc)
NON-HDL CHOLESTEROL (CALC): 132 mg/dL — AB (ref <130)
Triglycerides: 240 mg/dL — ABNORMAL HIGH (ref <150)

## 2018-02-26 LAB — RPR: RPR: REACTIVE — AB

## 2018-02-26 LAB — T-HELPER CELL (CD4) - (RCID CLINIC ONLY)
CD4 % Helper T Cell: 27 % — ABNORMAL LOW (ref 33–55)
CD4 T CELL ABS: 900 /uL (ref 400–2700)

## 2018-02-26 LAB — FLUORESCENT TREPONEMAL AB(FTA)-IGG-BLD: FLUORESCENT TREPONEMAL ABS: REACTIVE — AB

## 2018-02-26 LAB — RPR TITER: RPR Titer: 1:1 {titer} — ABNORMAL HIGH

## 2018-03-02 ENCOUNTER — Ambulatory Visit: Payer: No Typology Code available for payment source | Admitting: Internal Medicine

## 2018-03-02 LAB — HIV-1 RNA QUANT-NO REFLEX-BLD
HIV 1 RNA QUANT: 22 {copies}/mL — AB
HIV-1 RNA QUANT, LOG: 1.34 {Log_copies}/mL — AB

## 2018-03-11 ENCOUNTER — Ambulatory Visit (INDEPENDENT_AMBULATORY_CARE_PROVIDER_SITE_OTHER): Payer: BC Managed Care – PPO | Admitting: Internal Medicine

## 2018-03-11 ENCOUNTER — Encounter: Payer: Self-pay | Admitting: Internal Medicine

## 2018-03-11 ENCOUNTER — Other Ambulatory Visit (HOSPITAL_COMMUNITY)
Admission: RE | Admit: 2018-03-11 | Discharge: 2018-03-11 | Disposition: A | Discharge status: Home / Self Care | Payer: BC Managed Care – PPO | Source: Ambulatory Visit | Attending: Internal Medicine | Admitting: Internal Medicine

## 2018-03-11 VITALS — BP 116/78 | HR 79 | Temp 98.6°F | Wt 162.0 lb

## 2018-03-11 DIAGNOSIS — Z113 Encounter for screening for infections with a predominantly sexual mode of transmission: Secondary | ICD-10-CM | POA: Diagnosis not present

## 2018-03-11 DIAGNOSIS — Z79899 Other long term (current) drug therapy: Secondary | ICD-10-CM | POA: Diagnosis not present

## 2018-03-11 DIAGNOSIS — Z5181 Encounter for therapeutic drug level monitoring: Secondary | ICD-10-CM

## 2018-03-11 DIAGNOSIS — B20 Human immunodeficiency virus [HIV] disease: Secondary | ICD-10-CM | POA: Diagnosis not present

## 2018-03-11 NOTE — Assessment & Plan Note (Signed)
rpr stable, will do urine cytology today

## 2018-03-11 NOTE — Progress Notes (Signed)
   Subjective:    Patient ID: Drew Jenkins, male    DOB: 08/20/1966, 52 y.o.   MRN: [id number]  HPI Here for follow up of HIV On Genvoya and doing well, denies missed doses.  No associated n/v/d.  CD4 900 and viral load just 22.  Other labs ok.  Has a new partner who he lives with.  Uses condoms. No penile discharge, warts.  He did have an episode of chest pain yesterday but resolved on its own.  Not associated with nausea, diaphoresis. Not having any pain now.     Review of Systems  Constitutional: Negative for fatigue.  Gastrointestinal: Negative for diarrhea.  Skin: Negative for rash.       Objective:   Physical Exam  Constitutional: He appears well-developed and well-nourished. No distress.  HENT:  Mouth/Throat: No oropharyngeal exudate.  Eyes: No scleral icterus.  Cardiovascular: Normal rate, regular rhythm and normal heart sounds.  No murmur heard. Pulmonary/Chest: Effort normal and breath sounds normal. No respiratory distress.  Lymphadenopathy:    He has no cervical adenopathy.   SH: no tobacco       Assessment & Plan:

## 2018-03-11 NOTE — Assessment & Plan Note (Signed)
No issues on lipid panel.  Followed by his PCP so I will defer further evaluation and management to her.

## 2018-03-11 NOTE — Assessment & Plan Note (Signed)
Doing well on Genvoya.  No issues and rtc 6 months.

## 2018-03-11 NOTE — Assessment & Plan Note (Signed)
Creat, LFTs wnl.  

## 2018-03-12 LAB — URINE CYTOLOGY ANCILLARY ONLY
Chlamydia: NEGATIVE
NEISSERIA GONORRHEA: NEGATIVE

## 2018-04-08 ENCOUNTER — Other Ambulatory Visit: Payer: Self-pay | Admitting: Family Medicine

## 2018-04-22 ENCOUNTER — Ambulatory Visit: Payer: BC Managed Care – PPO | Admitting: Family Medicine

## 2018-04-28 ENCOUNTER — Other Ambulatory Visit: Payer: Self-pay | Admitting: *Deleted

## 2018-04-28 DIAGNOSIS — B20 Human immunodeficiency virus [HIV] disease: Secondary | ICD-10-CM

## 2018-04-28 MED ORDER — ELVITEG-COBIC-EMTRICIT-TENOFAF 150-150-200-10 MG PO TABS: 1.0000 | ORAL_TABLET | Freq: Every day | ORAL | 5 refills | Status: DC

## 2018-05-02 ENCOUNTER — Other Ambulatory Visit: Payer: Self-pay | Admitting: Family Medicine

## 2018-05-28 ENCOUNTER — Ambulatory Visit: Payer: BC Managed Care – PPO | Admitting: Urgent Care

## 2018-05-28 ENCOUNTER — Ambulatory Visit: Payer: No Typology Code available for payment source | Admitting: Family Medicine

## 2018-06-11 ENCOUNTER — Encounter: Payer: Self-pay | Admitting: Urgent Care

## 2018-06-11 ENCOUNTER — Ambulatory Visit: Payer: BC Managed Care – PPO | Admitting: Urgent Care

## 2018-06-11 VITALS — BP 129/72 | HR 80 | Temp 98.2°F | Resp 17 | Ht 65.5 in | Wt 164.0 lb

## 2018-06-11 DIAGNOSIS — M2550 Pain in unspecified joint: Secondary | ICD-10-CM

## 2018-06-11 DIAGNOSIS — Z566 Other physical and mental strain related to work: Secondary | ICD-10-CM

## 2018-06-11 DIAGNOSIS — F329 Major depressive disorder, single episode, unspecified: Secondary | ICD-10-CM | POA: Diagnosis not present

## 2018-06-11 DIAGNOSIS — M5136 Other intervertebral disc degeneration, lumbar region: Secondary | ICD-10-CM

## 2018-06-11 DIAGNOSIS — R45851 Suicidal ideations: Secondary | ICD-10-CM | POA: Diagnosis not present

## 2018-06-11 DIAGNOSIS — F32A Depression, unspecified: Secondary | ICD-10-CM

## 2018-06-11 MED ORDER — FLUOXETINE HCL 40 MG PO CAPS: 40.0000 mg | ORAL_CAPSULE | Freq: Every day | ORAL | 0 refills | Status: DC

## 2018-06-11 MED ORDER — FLUOXETINE HCL 20 MG PO TABS: 20.0000 mg | ORAL_TABLET | Freq: Every day | ORAL | 0 refills | Status: DC

## 2018-06-11 MED ORDER — FLUOXETINE HCL 10 MG PO TABS: 10.0000 mg | ORAL_TABLET | Freq: Every day | ORAL | 0 refills | Status: DC

## 2018-06-11 MED ORDER — HYDROXYZINE HCL 25 MG PO TABS: 25.0000 mg | ORAL_TABLET | Freq: Every evening | ORAL | 0 refills | Status: DC | PRN

## 2018-06-11 NOTE — Patient Instructions (Addendum)
Start with 20mg  Prozac for 2 weeks. Then fill the script for 10mg  and start taking 30mg  dose for another 2 weeks. Thereafter in 4 weeks, start taking 40mg  once daily.    [facility] [address] [place], [place] [address]   Shanon RosserBarbara Farran [phone]  Maris BergerKathy Kirstner [phone]  Marco CollieSusan Kroll-Smith [phone]  [facility] ([address]Beth Hulan AmatoKincaid, Ernest Beckey RutterMcCoy, Heather Joycelyn SchmidKitchens, Karla Alphaownsend) - [phone]  [facility](Julie KathleenWhitt) - [phone]  [facility] - [phone]  [facility] - [phone]  Buena IrishBob Mylan - [phone]  [facility]  - [phone] (do not file insurance)    Major Depressive Disorder, Adult Major depressive disorder (MDD) is a mental health condition. MDD often makes you feel sad, hopeless, or helpless. MDD can also cause symptoms in your body. MDD can affect your:  Work.  School.  Relationships.  Other normal activities.  MDD can range from mild to very bad. It may occur once (single episode MDD). It can also occur many times (recurrent MDD). The main symptoms of MDD often include:  Feeling sad, depressed, or irritable most of the time.  Loss of interest.  MDD symptoms also include:  Sleeping too much or too little.  Eating too much or too little.  A change in your weight.  Feeling tired (fatigue) or having low energy.  Feeling worthless.  Feeling guilty.  Trouble making decisions.  Trouble thinking clearly.  Thoughts of suicide or harming others.  Feeling weak.  Feeling agitated.  Keeping yourself from being around other people (isolation).  Follow these instructions at home: Activity  Do these things as told by your doctor: ? Go back to your normal activities. ? Exercise regularly. ? Spend time outdoors. Alcohol  Talk with your doctor about how alcohol can affect your antidepressant  medicines.  Do not drink alcohol. Or, limit how much alcohol you drink. ? This means no more than 1 drink a day for nonpregnant women and 2 drinks a day for men. One drink equals one of these:  12 oz of beer.  5 oz of wine.  1 oz of hard liquor. General instructions  Take over-the-counter and prescription medicines only as told by your doctor.  Eat a healthy diet.  Get plenty of sleep.  Find activities that you enjoy. Make time to do them.  Think about joining a support group. Your doctor may be able to suggest a group for you.  Keep all follow-up visits as told by your doctor. This is important. Where to find more information:  The [employer] on Mental Illness: ? www.nami.org  U.S. [employer] Institute of Mental Health: ? http://www.maynard.net/www.nimh.nih.gov  National Suicide Prevention Lifeline: ? (870) 362-59941-(309)445-1845. This is free, 24-hour help. Contact a doctor if:  Your symptoms get worse.  You have new symptoms. Get help right away if:  You self-harm.  You see, hear, taste, smell, or feel things that are not present (hallucinate). If you ever feel like you may hurt yourself or others, or have thoughts about taking your own life, get help right away. You can go to your nearest emergency department or call:  Your local emergency services (911 in the [place]  A suicide crisis helpline, such as the National Suicide Prevention Lifeline: ? (610)717-73981-(309)445-1845. This is open 24 hours a day.  This information is not intended to replace advice given to you by your health care provider. Make sure you discuss any questions you have with your health care provider. Document  Released: 11/12/2015 Document Revised: 08/17/2016 Document Reviewed: 08/17/2016 Elsevier Interactive Patient Education  2017 [employer].    IF you received an x-ray today, you will receive an invoice from [facility]. Please contact [facility] at [phone] with questions or concerns regarding your  invoice.   IF you received labwork today, you will receive an invoice from [facility]. Please contact [facility] at [phone] with questions or concerns regarding your invoice.   Our billing staff will not be able to assist you with questions regarding bills from these companies.  You will be contacted with the lab results as soon as they are available. The fastest way to get your results is to activate your My Chart account. Instructions are located on the last page of this paperwork. If you have not heard from [place] regarding the results in 2 weeks, please contact this office.

## 2018-06-11 NOTE — Progress Notes (Signed)
MRN: [id number] DOB: November 04, 1966  Subjective:   Drew Jenkins is a 52 y.o. male presenting to establish care, discuss depression and a handicap placard.  He was previously seen FNP Jerrilyn Cairo, referred patient to Neurosurgery [facility] Spine and an orthopedist.  This was with respect to diffuse degenerative disc disease in lumbar region, multiple joint pains.  He is requesting a handicap placard given his limited ability to walk long distances due to his orthopedic conditions.  Today, he also reports that he really needs to restart his depression medication.  States that he has a lot of financial stressors and at times feels like life is not worth living, thinks about taking his own life.  About a year ago, patient reports that he was ready to take his life, denies a full attempt.  He was never hospitalized nor has he talked to a therapist about this.  He was taking Prozac at 20 mg and felt like it was not helping.  He lost his insurance and had to stop taking medications altogether.  Today he reports he has had it since January and would like to restart taking care of himself.  Today, he denies active SI, HI.  He contracts for safety at home.  Denies smoking cigarettes or drink alcohol.  Drew Jenkins has a current medication list which includes the following prescription(s): levothyroxine, mirtazapine, pantoprazole, vitamin c, azithromycin, benzonatate, elvitegravir-cobicistat-emtricitabine-tenofovir, fluoxetine, gabapentin, gabapentin, meloxicam, and vitamin d (ergocalciferol). Also has No Known Allergies.  Drew Jenkins  has a past medical history of HIV infection (HCC). Denies past surgical history. His family history includes Diabetes in his mother; Hypertension in his father.   Objective:   Vitals: BP 129/72   Pulse 80   Temp 98.2 F (36.8 C) (Oral)   Resp 17   Ht 5' 5.5" (1.664 m)   Wt 164 lb (74.4 kg)   SpO2 98%   BMI 26.88 kg/m   Physical Exam  Constitutional: He is oriented to person, place, and time. He  appears well-developed and well-nourished.  HENT:  Mouth/Throat: Oropharynx is clear and moist.  Eyes: Pupils are equal, round, and reactive to light. EOM are normal. Right eye exhibits no discharge. Left eye exhibits no discharge. No scleral icterus.  Neck: Normal range of motion. Neck supple. No thyromegaly present.  Cardiovascular: Normal rate, regular rhythm and intact distal pulses. Exam reveals no gallop and no friction rub.  No murmur heard. Pulmonary/Chest: No stridor. No respiratory distress. He has no wheezes. He has no rales.  Neurological: He is alert and oriented to person, place, and time.  Skin: Skin is warm and dry.  Psychiatric: His mood appears not anxious. His affect is not angry, not blunt, not labile and not inappropriate. His speech is not rapid and/or pressured, not delayed, not tangential and not slurred. He is slowed. He is not agitated, not aggressive, not hyperactive, not withdrawn and not combative. Thought content is not paranoid and not delusional. He expresses no suicidal plans and no homicidal plans. He is communicative.  Flat affect, depressed mood.  Not suicidal or homicidal.  Slowed speech, monotone.   Assessment and Plan :   Degenerative disc disease, lumbar  Multiple joint pain  Depression, unspecified depression type  Suicidal ideation  Stress at work  Patient is to continue efforts that obtaining the consult with the neurosurgeon.  In the meantime I will give patient a placard for 6 months.  Discussed his depression at length.  We will restart Prozac at 20 mg and  titrate up to 40 mg over the next 4 weeks.  ER precautions reviewed with patient.  Wallis BambergMario Nalayah Hitt, PA-C [facility] [phone] 06/11/2018  11:43 AM

## 2018-07-10 ENCOUNTER — Other Ambulatory Visit: Payer: Self-pay | Admitting: Family Medicine

## 2018-07-10 DIAGNOSIS — E039 Hypothyroidism, unspecified: Secondary | ICD-10-CM

## 2018-07-12 ENCOUNTER — Other Ambulatory Visit: Payer: Self-pay | Admitting: Urgent Care

## 2018-07-12 ENCOUNTER — Other Ambulatory Visit: Payer: Self-pay | Admitting: Family Medicine

## 2018-07-13 NOTE — Telephone Encounter (Signed)
req for Hydroxyzine refill; [facility] 06/11/18; #30; no refills  Last OV:  estab. care 06/11/18  PCP: Wallis BambergMario Mani  Pharm.: [facility]; [address] , [place]  Need approval to refill

## 2018-07-15 ENCOUNTER — Telehealth: Payer: Self-pay | Admitting: Urgent Care

## 2018-07-16 ENCOUNTER — Ambulatory Visit: Payer: BC Managed Care – PPO | Admitting: Urgent Care

## 2018-07-17 ENCOUNTER — Ambulatory Visit: Payer: BC Managed Care – PPO | Admitting: Urgent Care

## 2018-07-24 ENCOUNTER — Ambulatory Visit: Payer: BC Managed Care – PPO | Admitting: Urgent Care

## 2018-08-13 ENCOUNTER — Other Ambulatory Visit: Payer: Self-pay | Admitting: Family Medicine

## 2018-08-19 ENCOUNTER — Other Ambulatory Visit: Payer: BC Managed Care – PPO

## 2018-08-19 DIAGNOSIS — B20 Human immunodeficiency virus [HIV] disease: Secondary | ICD-10-CM

## 2018-08-20 LAB — T-HELPER CELL (CD4) - (RCID CLINIC ONLY)
CD4 % Helper T Cell: 25 % — ABNORMAL LOW (ref 33–55)
CD4 T CELL ABS: 910 /uL (ref 400–2700)

## 2018-08-21 ENCOUNTER — Other Ambulatory Visit: Payer: Self-pay

## 2018-08-21 ENCOUNTER — Ambulatory Visit: Payer: BC Managed Care – PPO | Admitting: Urgent Care

## 2018-08-21 ENCOUNTER — Encounter: Payer: Self-pay | Admitting: Urgent Care

## 2018-08-21 VITALS — BP 118/72 | HR 61 | Temp 98.0°F | Resp 16 | Ht 65.75 in | Wt 168.6 lb

## 2018-08-21 DIAGNOSIS — G2581 Restless legs syndrome: Secondary | ICD-10-CM

## 2018-08-21 DIAGNOSIS — E039 Hypothyroidism, unspecified: Secondary | ICD-10-CM | POA: Diagnosis not present

## 2018-08-21 DIAGNOSIS — Z566 Other physical and mental strain related to work: Secondary | ICD-10-CM | POA: Diagnosis not present

## 2018-08-21 DIAGNOSIS — R45851 Suicidal ideations: Secondary | ICD-10-CM

## 2018-08-21 DIAGNOSIS — F32A Depression, unspecified: Secondary | ICD-10-CM

## 2018-08-21 DIAGNOSIS — F329 Major depressive disorder, single episode, unspecified: Secondary | ICD-10-CM | POA: Diagnosis not present

## 2018-08-21 MED ORDER — CYCLOBENZAPRINE HCL 5 MG PO TABS: 5.0000 mg | ORAL_TABLET | Freq: Every day | ORAL | 1 refills | Status: DC

## 2018-08-21 MED ORDER — PANTOPRAZOLE SODIUM 20 MG PO TBEC: 20.0000 mg | DELAYED_RELEASE_TABLET | Freq: Two times a day (BID) | ORAL | 1 refills | Status: DC

## 2018-08-21 MED ORDER — FLUOXETINE HCL 40 MG PO CAPS: 40.0000 mg | ORAL_CAPSULE | Freq: Every day | ORAL | 3 refills | Status: DC

## 2018-08-21 MED ORDER — LEVOTHYROXINE SODIUM 200 MCG PO TABS: 200.0000 ug | ORAL_TABLET | Freq: Every day | ORAL | 1 refills | Status: DC

## 2018-08-21 NOTE — Progress Notes (Signed)
   MRN: [id number] DOB: Jul 20, 1966  Subjective:   Drew Jenkins is a 52 y.o. male presenting for follow up on depression.  Patient was restarted on fluoxetine at his last office visit on 06/11/2018.  He was given option to titrate his fluoxetine from 20 to 40 mg.  Today he is presenting for follow-up and reports that he is doing better.  His stress at work has improved given that he is now working full-time.  He has no longer having any thoughts about death.  Sleep is still difficult for him given his restless legs.  He would like his Flexeril refilled for this.  He is only taking 20 mg of fluoxetine.  He is also requesting refill of his thyroid medication and GERD medication.  Drew Jenkins has a current medication list which includes the following prescription(s): elvitegravir-cobicistat-emtricitabine-tenofovir, fluoxetine, hydroxyzine, levothyroxine, and pantoprazole. Also has No Known Allergies.    Objective:   Vitals: BP 118/72   Pulse 61   Temp 98 F (36.7 C) (Oral)   Resp 16   Ht 5' 5.75" (1.67 m)   Wt 168 lb 9.6 oz (76.5 kg)   SpO2 97%   BMI 27.42 kg/m   Physical Exam  Constitutional: He is oriented to person, place, and time. He appears well-developed and well-nourished.  Cardiovascular: Normal rate.  Pulmonary/Chest: Effort normal.  Neurological: He is alert and oriented to person, place, and time.  Psychiatric: His mood appears not anxious. His affect is not blunt and not labile. His speech is not rapid and/or pressured, not delayed, not tangential and not slurred. He is not agitated and not aggressive. He does not exhibit a depressed mood. He expresses no homicidal and no suicidal ideation.   Assessment and Plan :   Depression, unspecified depression type  Stress at work  Suicidal ideation  Hypothyroidism, adult - Plan: levothyroxine (SYNTHROID, LEVOTHROID) 200 MCG tablet  Restless leg  Improved but patient thinks it could be better until we will increase his fluoxetine  from 20 to 40 mg once daily.  Refills provided.  Patient will follow-up with a different provider at this practice or see me at my new clinic [place]. Return-to-clinic precautions discussed, patient verbalized understanding.   Wallis Bamberg, PA-C [facility] [phone] 08/21/2018 9:13 AM

## 2018-08-21 NOTE — Patient Instructions (Addendum)
[facility] [address], [place], [place] [address]  [phone] I'll be starting in October 2019.   Dr. Alvy Bimler is a good option for you if you want to continue coming to [place].     Fluoxetine capsules or tablets (Depression/Mood Disorders) What is this medicine? FLUOXETINE (floo OX e teen) belongs to a class of drugs known as selective serotonin reuptake inhibitors (SSRIs). It helps to treat mood problems such as depression, obsessive compulsive disorder, and panic attacks. It can also treat certain eating disorders. This medicine may be used for other purposes; ask your health care provider or pharmacist if you have questions. COMMON BRAND NAME(S): Prozac What should I tell my health care provider before I take this medicine? They need to know if you have any of these conditions: -bipolar disorder or a family history of bipolar disorder -bleeding disorders -glaucoma -heart disease -liver disease -low levels of sodium in the blood -seizures -suicidal thoughts, plans, or attempt; a previous suicide attempt by you or a family member -take MAOIs like Carbex, Eldepryl, Marplan, Nardil, and Parnate -take medicines that treat or prevent blood clots -thyroid disease -an unusual or allergic reaction to fluoxetine, other medicines, foods, dyes, or preservatives -pregnant or trying to get pregnant -breast-feeding How should I use this medicine? Take this medicine by mouth with a glass of water. Follow the directions on the prescription label. You can take this medicine with or without food. Take your medicine at regular intervals. Do not take it more often than directed. Do not stop taking this medicine suddenly except upon the advice of your doctor. Stopping this medicine too quickly may cause serious side effects or your condition may worsen. A special MedGuide will be given to you by the pharmacist with each prescription and refill. Be sure to read this  information carefully each time. Talk to your pediatrician regarding the use of this medicine in children. While this drug may be prescribed for children as young as 7 years for selected conditions, precautions do apply. Overdosage: If you think you have taken too much of this medicine contact a poison control center or emergency room at once. NOTE: This medicine is only for you. Do not share this medicine with others. What if I miss a dose? If you miss a dose, skip the missed dose and go back to your regular dosing schedule. Do not take double or extra doses. What may interact with this medicine? Do not take this medicine with any of the following medications: -other medicines containing fluoxetine, like Sarafem or Symbyax -cisapride -linezolid -MAOIs like Carbex, Eldepryl, Marplan, Nardil, and Parnate -methylene blue (injected into a vein) -pimozide -thioridazine This medicine may also interact with the following medications: -alcohol -amphetamines -aspirin and aspirin-like medicines -carbamazepine -certain medicines for depression, anxiety, or psychotic disturbances -certain medicines for migraine headaches like almotriptan, eletriptan, frovatriptan, naratriptan, rizatriptan, sumatriptan, zolmitriptan -digoxin -diuretics -fentanyl -flecainide -furazolidone -isoniazid -lithium -medicines for sleep -medicines that treat or prevent blood clots like warfarin, enoxaparin, and dalteparin -NSAIDs, medicines for pain and inflammation, like ibuprofen or naproxen -phenytoin -procarbazine -propafenone -rasagiline -ritonavir -supplements like St. John's wort, kava kava, valerian -tramadol -tryptophan -vinblastine This list may not describe all possible interactions. Give your health care provider a list of all the medicines, herbs, non-prescription drugs, or dietary supplements you use. Also tell them if you smoke, drink alcohol, or use illegal drugs. Some items may interact with your  medicine. What should I watch for while using this  medicine? Tell your doctor if your symptoms do not get better or if they get worse. Visit your doctor or health care professional for regular checks on your progress. Because it may take several weeks to see the full effects of this medicine, it is important to continue your treatment as prescribed by your doctor. Patients and their families should watch out for new or worsening thoughts of suicide or depression. Also watch out for sudden changes in feelings such as feeling anxious, agitated, panicky, irritable, hostile, aggressive, impulsive, severely restless, overly excited and hyperactive, or not being able to sleep. If this happens, especially at the beginning of treatment or after a change in dose, call your health care professional. Bonita Quin may get drowsy or dizzy. Do not drive, use machinery, or do anything that needs mental alertness until you know how this medicine affects you. Do not stand or sit up quickly, especially if you are an older patient. This reduces the risk of dizzy or fainting spells. Alcohol may interfere with the effect of this medicine. Avoid alcoholic drinks. Your mouth may get dry. Chewing sugarless gum or sucking hard candy, and drinking plenty of water may help. Contact your doctor if the problem does not go away or is severe. This medicine may affect blood sugar levels. If you have diabetes, check with your doctor or health care professional before you change your diet or the dose of your diabetic medicine. What side effects may I notice from receiving this medicine? Side effects that you should report to your doctor or health care professional as soon as possible: -allergic reactions like skin rash, itching or hives, swelling of the face, lips, or tongue -anxious -black, tarry stools -breathing problems -changes in vision -confusion -elevated mood, decreased need for sleep, racing thoughts, impulsive behavior -eye  pain -fast, irregular heartbeat -feeling faint or lightheaded, falls -feeling agitated, angry, or irritable -hallucination, loss of contact with reality -loss of balance or coordination -loss of memory -painful or prolonged erections -restlessness, pacing, inability to keep still -seizures -stiff muscles -suicidal thoughts or other mood changes -trouble sleeping -unusual bleeding or bruising -unusually weak or tired -vomiting Side effects that usually do not require medical attention (report to your doctor or health care professional if they continue or are bothersome): -change in appetite or weight -change in sex drive or performance -diarrhea -dry mouth -headache -increased sweating -nausea -tremors This list may not describe all possible side effects. Call your doctor for medical advice about side effects. You may report side effects to FDA at 1-800-FDA-1088. Where should I keep my medicine? Keep out of the reach of children. Store at room temperature between 15 and 30 degrees C (59 and 86 degrees F). Throw away any unused medicine after the expiration date. NOTE: This sheet is a summary. It may not cover all possible information. If you have questions about this medicine, talk to your doctor, pharmacist, or health care provider.  2018 Elsevier/Gold Standard (2016-05-03 15:55:27)     If you have lab work done today you will be contacted with your lab results within the next 2 weeks.  If you have not heard from [place] then please contact us. The fastest way to get your results is to register for My Chart.   IF you received an x-ray today, you will receive an invoice from [facility]. Please contact [facility] at [phone] with questions or concerns regarding your invoice.   IF you received labwork today, you will receive an invoice from [employer].  Please contact [facility] at [phone] with questions or concerns regarding your invoice.   Our billing  staff will not be able to assist you with questions regarding bills from these companies.  You will be contacted with the lab results as soon as they are available. The fastest way to get your results is to activate your My Chart account. Instructions are located on the last page of this paperwork. If you have not heard from [place] regarding the results in 2 weeks, please contact this office.

## 2018-08-23 LAB — HIV-1 RNA QUANT-NO REFLEX-BLD
HIV 1 RNA QUANT: 61 {copies}/mL — AB
HIV-1 RNA QUANT, LOG: 1.79 {Log_copies}/mL — AB

## 2018-09-02 ENCOUNTER — Ambulatory Visit (INDEPENDENT_AMBULATORY_CARE_PROVIDER_SITE_OTHER): Payer: BC Managed Care – PPO | Admitting: Internal Medicine

## 2018-09-02 ENCOUNTER — Encounter: Payer: Self-pay | Admitting: Internal Medicine

## 2018-09-02 VITALS — BP 132/78 | HR 56 | Temp 98.4°F | Ht 64.0 in | Wt 164.0 lb

## 2018-09-02 DIAGNOSIS — B20 Human immunodeficiency virus [HIV] disease: Secondary | ICD-10-CM | POA: Diagnosis not present

## 2018-09-02 DIAGNOSIS — R85619 Unspecified abnormal cytological findings in specimens from anus: Secondary | ICD-10-CM | POA: Diagnosis not present

## 2018-09-02 DIAGNOSIS — Z7185 Encounter for immunization safety counseling: Secondary | ICD-10-CM | POA: Insufficient documentation

## 2018-09-02 DIAGNOSIS — Z7189 Other specified counseling: Secondary | ICD-10-CM | POA: Insufficient documentation

## 2018-09-02 DIAGNOSIS — Z23 Encounter for immunization: Secondary | ICD-10-CM | POA: Insufficient documentation

## 2018-09-02 DIAGNOSIS — Z113 Encounter for screening for infections with a predominantly sexual mode of transmission: Secondary | ICD-10-CM | POA: Diagnosis not present

## 2018-09-02 NOTE — Progress Notes (Signed)
   Subjective:    Patient ID: Lars MageBenny Symons II, male    DOB: 10/09/1966, 52 y.o.   MRN: [id number]  HPI Here for follow up of HIV On Genvoya and doing well, denies missed doses.  No associated n/v/d.  CD4 910 and viral load just 61. Uses condoms. No penile discharge, warts.  No new complaints     Review of Systems  Constitutional: Negative for fatigue.  Gastrointestinal: Negative for diarrhea.  Skin: Negative for rash.       Objective:   Physical Exam  Constitutional: He appears well-developed and well-nourished. No distress.  HENT:  Mouth/Throat: No oropharyngeal exudate.  Eyes: No scleral icterus.  Cardiovascular: Normal rate, regular rhythm and normal heart sounds.  No murmur heard. Pulmonary/Chest: Effort normal and breath sounds normal. No respiratory distress.  Lymphadenopathy:    He has no cervical adenopathy.   SH: no tobacco       Assessment & Plan:

## 2018-09-02 NOTE — Assessment & Plan Note (Signed)
I will refer him to the [place] study next visit

## 2018-09-02 NOTE — Assessment & Plan Note (Signed)
No current concerns.  offfered condoms.

## 2018-09-02 NOTE — Addendum Note (Signed)
Addended by: Gildardo GriffesHILL, Luman Holway M on: 09/02/2018 12:18 PM   Modules accepted: Orders

## 2018-09-02 NOTE — Assessment & Plan Note (Signed)
Discussed Menveo and given today. #2 at next visit Given flu shot as well

## 2018-09-02 NOTE — Assessment & Plan Note (Signed)
Doing well, no missed doses.  rtc 6 months

## 2018-10-07 NOTE — Telephone Encounter (Signed)
done

## 2018-10-11 ENCOUNTER — Other Ambulatory Visit: Payer: Self-pay | Admitting: Internal Medicine

## 2018-10-11 DIAGNOSIS — B20 Human immunodeficiency virus [HIV] disease: Secondary | ICD-10-CM

## 2019-01-20 ENCOUNTER — Ambulatory Visit: Payer: BC Managed Care – PPO | Admitting: Emergency Medicine

## 2019-01-20 ENCOUNTER — Other Ambulatory Visit: Payer: Self-pay | Admitting: Urgent Care

## 2019-01-20 DIAGNOSIS — E039 Hypothyroidism, unspecified: Secondary | ICD-10-CM

## 2019-02-17 ENCOUNTER — Other Ambulatory Visit: Payer: BC Managed Care – PPO

## 2019-02-24 ENCOUNTER — Other Ambulatory Visit: Payer: Self-pay

## 2019-02-24 ENCOUNTER — Other Ambulatory Visit: Payer: Self-pay | Admitting: Internal Medicine

## 2019-02-24 ENCOUNTER — Encounter: Payer: Self-pay | Admitting: Emergency Medicine

## 2019-02-24 ENCOUNTER — Ambulatory Visit (INDEPENDENT_AMBULATORY_CARE_PROVIDER_SITE_OTHER): Payer: BC Managed Care – PPO | Admitting: Emergency Medicine

## 2019-02-24 VITALS — BP 113/74 | HR 99 | Temp 98.6°F | Resp 16 | Ht 64.5 in | Wt 162.0 lb

## 2019-02-24 DIAGNOSIS — F331 Major depressive disorder, recurrent, moderate: Secondary | ICD-10-CM

## 2019-02-24 DIAGNOSIS — E039 Hypothyroidism, unspecified: Secondary | ICD-10-CM | POA: Diagnosis not present

## 2019-02-24 DIAGNOSIS — B20 Human immunodeficiency virus [HIV] disease: Secondary | ICD-10-CM

## 2019-02-24 DIAGNOSIS — G2581 Restless legs syndrome: Secondary | ICD-10-CM

## 2019-02-24 DIAGNOSIS — R531 Weakness: Secondary | ICD-10-CM

## 2019-02-24 DIAGNOSIS — K219 Gastro-esophageal reflux disease without esophagitis: Secondary | ICD-10-CM

## 2019-02-24 DIAGNOSIS — R5383 Other fatigue: Secondary | ICD-10-CM | POA: Insufficient documentation

## 2019-02-24 NOTE — Patient Instructions (Addendum)

## 2019-02-24 NOTE — Progress Notes (Signed)
Drew Jenkins 53 y.o.   Chief Complaint  Patient presents with  . Establish Care    DISCUSS TESTOSTERONE and lab work    HISTORY OF PRESENT ILLNESS: This is a 53 y.o. male here to establish care with me.  Previously under the care of [facility].  Wants to discuss testosterone and possible lab work.  Complaining of general weakness for several months. 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 Has a history of HIV on Genvoya.  Has a local infectious disease doctor. Has a history of partial thyroidectomy, on Synthroid. Has a history of GERD: On Protonix twice a day. Has a history of restless leg syndrome, takes cyclobenzaprine at bedtime with relief. History of depression, on Prozac. Requesting medication refills. No other complaints or medical concerns today.   HPI   Prior to Admission medications   Medication Sig Start Date End Date Taking? Authorizing Provider  cyclobenzaprine (FLEXERIL) 5 MG tablet Take 1 tablet (5 mg total) by mouth at bedtime. 08/21/18  Yes Wallis Bamberg, PA-C  FLUoxetine (PROZAC) 40 MG capsule Take 1 capsule (40 mg total) by mouth daily. 08/21/18  Yes Mani, Mario, PA-C  GENVOYA 150-150-200-10 MG TABS tablet TAKE 1 TABLET BY MOUTH DAILY WITH BREAKFAST. ([place]) 10/11/18  Yes Comer, Belia Heman, MD  levothyroxine (SYNTHROID, LEVOTHROID) 200 MCG tablet TAKE 1 TABLET(200 MCG) BY MOUTH DAILY BEFORE BREAKFAST 01/21/19  Yes Kasi Lasky, Eilleen Kempf, MD  pantoprazole (PROTONIX) 20 MG tablet TAKE 1 TABLET(20 MG) BY MOUTH TWICE DAILY 01/21/19  Yes Kile Kabler, Eilleen Kempf, MD  hydrOXYzine (ATARAX/VISTARIL) 25 MG tablet Take 1-2 tablets (25-50 mg total) by mouth at bedtime as needed. Patient not taking: Reported on 02/24/2019 06/11/18   Wallis Bamberg, PA-C    No Known Allergies  Patient Active Problem List   Diagnosis Date Noted  . Vaccine counseling 09/02/2018  . Medication monitoring encounter 03/11/2018  . Screening examination for venereal disease 04/09/2017  .  Encounter for long-term (current) use of high-risk medication 04/09/2017  . Depression 11/20/2016  . GERD (gastroesophageal reflux disease) 11/20/2016  . Human immunodeficiency virus I infection (HCC) 08/21/2016  . Hypothyroidism 08/21/2016  . Restless leg 08/21/2016  . H/O syphilis 08/21/2016  . Overweight (BMI 25.0-29.9) 08/21/2016  . Abnormal anal Papanicolaou smear 08/21/2016  . Lumbago 08/21/2016    Past Medical History:  Diagnosis Date  . HIV infection (HCC)     No past surgical history on file.  Social History   Socioeconomic History  . Marital status: Unknown    Spouse name: Not on file  . Number of children: Not on file  . Years of education: Not on file  . Highest education level: Not on file  Occupational History  . Not on file  Social Needs  . Financial resource strain: Not on file  . Food insecurity:    Worry: Not on file    Inability: Not on file  . Transportation needs:    Medical: Not on file    Non-medical: Not on file  Tobacco Use  . Smoking status: Never Smoker  . Smokeless tobacco: Never Used  Substance and Sexual Activity  . Alcohol use: No  . Drug use: No  . Sexual activity: Not Currently    Comment: pt declined condoms  Lifestyle  . Physical activity:    Days per week: Not on file    Minutes per session: Not on file  . Stress: Not on file  Relationships  . Social connections:  Talks on phone: Not on file    Gets together: Not on file    Attends religious service: Not on file    Active member of club or organization: Not on file    Attends meetings of clubs or organizations: Not on file    Relationship status: Not on file  . Intimate partner violence:    Fear of current or ex partner: Not on file    Emotionally abused: Not on file    Physically abused: Not on file    Forced sexual activity: Not on file  Other Topics Concern  . Not on file  Social History Narrative  . Not on file    Family History  Problem Relation Age of  Onset  . Diabetes Mother   . Hypertension Father      Review of Systems  Constitutional: Positive for malaise/fatigue. Negative for chills and fever.  HENT: Positive for hearing loss (Chronic).   Eyes: Negative for blurred vision and double vision.  Respiratory: Negative.  Negative for cough and shortness of breath.   Cardiovascular: Negative.  Negative for chest pain and palpitations.  Gastrointestinal: Negative for abdominal pain, diarrhea, nausea and vomiting.  Genitourinary: Negative.   Musculoskeletal: Negative.   Skin: Negative.  Negative for rash.  Neurological: Negative.  Negative for dizziness and headaches.  Endo/Heme/Allergies: Negative.   Psychiatric/Behavioral: Positive for depression.  All other systems reviewed and are negative.   Vitals:   02/24/19 1017  BP: 113/74  Pulse: 99  Resp: 16  Temp: 98.6 F (37 C)  SpO2: 98%    Physical Exam Vitals signs reviewed.  Constitutional:      Appearance: Normal appearance.  HENT:     Head: Normocephalic and atraumatic.     Nose: Nose normal.     Mouth/Throat:     Mouth: Mucous membranes are moist.     Pharynx: Oropharynx is clear.  Neck:     Musculoskeletal: Normal range of motion and neck supple.  Cardiovascular:     Rate and Rhythm: Normal rate and regular rhythm.     Heart sounds: Normal heart sounds.  Pulmonary:     Effort: Pulmonary effort is normal.     Breath sounds: Normal breath sounds.  Abdominal:     Palpations: Abdomen is soft.     Tenderness: There is no abdominal tenderness.  Musculoskeletal: Normal range of motion.  Skin:    General: Skin is warm and dry.     Capillary Refill: Capillary refill takes less than 2 seconds.  Neurological:     General: No focal deficit present.     Mental Status: He is alert and oriented to person, place, and time.     Sensory: No sensory deficit.     Motor: No weakness.  Psychiatric:        Mood and Affect: Mood normal.        Behavior: Behavior normal.     A total of 25 minutes was spent in the room with the patient, greater than 50% of which was in counseling/coordination of care regarding differential diagnosis, medications, treatment, need for diagnostic work-up, diet and nutrition, and need for follow-up.   ASSESSMENT & PLAN: Drew Jenkins was seen today for establish care.  Diagnoses and all orders for this visit:  General weakness -     CBC with Differential/Platelet -     Comprehensive metabolic panel -     Testosterone, Free, Total, SHBG; Future -     TSH -  Lipid panel  Moderate episode of recurrent major depressive disorder (HCC)  Hypothyroidism, adult  Restless leg  Human immunodeficiency virus I infection (HCC)  Gastroesophageal reflux disease without esophagitis    Patient Instructions       If you have lab work done today you will be contacted with your lab results within the next 2 weeks.  If you have not heard from [place] then please contact us. The fastest way to get your results is to register for My Chart.   IF you received an x-ray today, you will receive an invoice from [facility]. Please contact [facility] at [phone] with questions or concerns regarding your invoice.   IF you received labwork today, you will receive an invoice from [facility]. Please contact [facility] at [phone] with questions or concerns regarding your invoice.   Our billing staff will not be able to assist you with questions regarding bills from these companies.  You will be contacted with the lab results as soon as they are available. The fastest way to get your results is to activate your My Chart account. Instructions are located on the last page of this paperwork. If you have not heard from [place] regarding the results in 2 weeks, please contact this office.     Health Maintenance, Male A healthy lifestyle and preventive care is important for your health and wellness. Ask your health care provider about what  schedule of regular examinations is right for you. What should I know about weight and diet? Eat a Healthy Diet  Eat plenty of vegetables, fruits, whole grains, low-fat dairy products, and lean protein.  Do not eat a lot of foods high in solid fats, added sugars, or salt.  Maintain a Healthy Weight Regular exercise can help you achieve or maintain a healthy weight. You should:  Do at least 150 minutes of exercise each week. The exercise should increase your heart rate and make you sweat (moderate-intensity exercise).  Do strength-training exercises at least twice a week. Watch Your Levels of Cholesterol and Blood Lipids  Have your blood tested for lipids and cholesterol every 5 years starting at 55 years of age. If you are at high risk for heart disease, you should start having your blood tested when you are 55 years old. You may need to have your cholesterol levels checked more often if: ? Your lipid or cholesterol levels are high. ? You are older than 55 years of age. ? You are at high risk for heart disease. What should I know about cancer screening? Many types of cancers can be detected early and may often be prevented. Lung Cancer  You should be screened every year for lung cancer if: ? You are a current smoker who has smoked for at least 30 years. ? You are a former smoker who has quit within the past 15 years.  Talk to your health care provider about your screening options, when you should start screening, and how often you should be screened. Colorectal Cancer  Routine colorectal cancer screening usually begins at 55 years of age and should be repeated every 5-10 years until you are 55 years old. You may need to be screened more often if early forms of precancerous polyps or small growths are found. Your health care provider may recommend screening at an earlier age if you have risk factors for colon cancer.  Your health care provider may recommend using home test kits to  check for hidden blood in the stool.  A small camera at the end of a tube can be used to examine your colon (sigmoidoscopy or colonoscopy). This checks for the earliest forms of colorectal cancer. Prostate and Testicular Cancer  Depending on your age and overall health, your health care provider may do certain tests to screen for prostate and testicular cancer.  Talk to your health care provider about any symptoms or concerns you have about testicular or prostate cancer. Skin Cancer  Check your skin from head to toe regularly.  Tell your health care provider about any new moles or changes in moles, especially if: ? There is a change in a mole's size, shape, or color. ? You have a mole that is larger than a pencil eraser.  Always use sunscreen. Apply sunscreen liberally and repeat throughout the day.  Protect yourself by wearing long sleeves, pants, a wide-brimmed hat, and sunglasses when outside. What should I know about heart disease, diabetes, and high blood pressure?  If you are 3-40 years of age, have your blood pressure checked every 3-5 years. If you are 33 years of age or older, have your blood pressure checked every year. You should have your blood pressure measured twice-once when you are at a hospital or clinic, and once when you are not at a hospital or clinic. Record the average of the two measurements. To check your blood pressure when you are not at a hospital or clinic, you can use: ? An automated blood pressure machine at a pharmacy. ? A home blood pressure monitor.  Talk to your health care provider about your target blood pressure.  If you are between 37-21 years old, ask your health care provider if you should take aspirin to prevent heart disease.  Have regular diabetes screenings by checking your fasting blood sugar level. ? If you are at a normal weight and have a low risk for diabetes, have this test once every three years after the age of 61. ? If you are  overweight and have a high risk for diabetes, consider being tested at a younger age or more often.  A one-time screening for abdominal aortic aneurysm (AAA) by ultrasound is recommended for men aged 65-75 years who are current or former smokers. What should I know about preventing infection? Hepatitis B If you have a higher risk for hepatitis B, you should be screened for this virus. Talk with your health care provider to find out if you are at risk for hepatitis B infection. Hepatitis C Blood testing is recommended for:  Everyone born from [birth year] through 1967.  Anyone with known risk factors for hepatitis C. Sexually Transmitted Diseases (STDs)  You should be screened each year for STDs including gonorrhea and chlamydia if: ? You are sexually active and are younger than 55 years of age. ? You are older than 55 years of age and your health care provider tells you that you are at risk for this type of infection. ? Your sexual activity has changed since you were last screened and you are at an increased risk for chlamydia or gonorrhea. Ask your health care provider if you are at risk.  Talk with your health care provider about whether you are at high risk of being infected with HIV. Your health care provider may recommend a prescription medicine to help prevent HIV infection. What else can I do?  Schedule regular health, dental, and eye exams.  Stay current with your vaccines (immunizations).  Do not use any tobacco products, such as cigarettes,  chewing tobacco, and e-cigarettes. If you need help quitting, ask your health care provider.  Limit alcohol intake to no more than 2 drinks per day. One drink equals 12 ounces of beer, 5 ounces of wine, or 1 ounces of hard liquor.  Do not use street drugs.  Do not share needles.  Ask your health care provider for help if you need support or information about quitting drugs.  Tell your health care provider if you often feel depressed.  Tell  your health care provider if you have ever been abused or do not feel safe at home. This information is not intended to replace advice given to you by your health care provider. Make sure you discuss any questions you have with your health care provider. Document Released: 05/29/2008 Document Revised: 07/30/2016 Document Reviewed: 09/04/2015 Elsevier Interactive Patient Education  2019 Elsevier Inc.      Edwina Barth, MD [facility]

## 2019-02-25 ENCOUNTER — Encounter: Payer: Self-pay | Admitting: Radiology

## 2019-02-25 LAB — COMPREHENSIVE METABOLIC PANEL
ALT: 36 IU/L (ref 0–44)
AST: 34 IU/L (ref 0–40)
Albumin/Globulin Ratio: 1.4 (ref 1.2–2.2)
Albumin: 4.4 g/dL (ref 3.8–4.9)
Alkaline Phosphatase: 78 IU/L (ref 39–117)
BUN/Creatinine Ratio: 10 (ref 9–20)
BUN: 10 mg/dL (ref 6–24)
Bilirubin Total: 0.8 mg/dL (ref 0.0–1.2)
CO2: 23 mmol/L (ref 20–29)
Calcium: 9.4 mg/dL (ref 8.7–10.2)
Chloride: 102 mmol/L (ref 96–106)
Creatinine, Ser: 0.97 mg/dL (ref 0.76–1.27)
GFR calc Af Amer: 102 mL/min/{1.73_m2} (ref >59)
GFR calc non Af Amer: 88 mL/min/{1.73_m2} (ref >59)
Globulin, Total: 3.1 g/dL (ref 1.5–4.5)
Glucose: 82 mg/dL (ref 65–99)
Potassium: 4.4 mmol/L (ref 3.5–5.2)
Sodium: 142 mmol/L (ref 134–144)
Total Protein: 7.5 g/dL (ref 6.0–8.5)

## 2019-02-25 LAB — CBC WITH DIFFERENTIAL/PLATELET
Basophils Absolute: 0 10*3/uL (ref 0.0–0.2)
Basos: 1 %
EOS (ABSOLUTE): 0.3 10*3/uL (ref 0.0–0.4)
Eos: 4 %
Hematocrit: 44.6 % (ref 37.5–51.0)
Hemoglobin: 15.6 g/dL (ref 13.0–17.7)
Immature Grans (Abs): 0 10*3/uL (ref 0.0–0.1)
Immature Granulocytes: 0 %
Lymphocytes Absolute: 3.8 10*3/uL — ABNORMAL HIGH (ref 0.7–3.1)
Lymphs: 46 %
MCH: 29.9 pg (ref 26.6–33.0)
MCHC: 35 g/dL (ref 31.5–35.7)
MCV: 86 fL (ref 79–97)
Monocytes Absolute: 0.7 10*3/uL (ref 0.1–0.9)
Monocytes: 9 %
Neutrophils Absolute: 3.3 10*3/uL (ref 1.4–7.0)
Neutrophils: 40 %
Platelets: 310 10*3/uL (ref 150–450)
RBC: 5.21 x10E6/uL (ref 4.14–5.80)
RDW: 12.1 % (ref 11.6–15.4)
WBC: 8.2 10*3/uL (ref 3.4–10.8)

## 2019-02-25 LAB — LIPID PANEL
Chol/HDL Ratio: 4.6 ratio (ref 0.0–5.0)
Cholesterol, Total: 182 mg/dL (ref 100–199)
HDL: 40 mg/dL (ref >39)
LDL Calculated: 95 mg/dL (ref 0–99)
TRIGLYCERIDES: 236 mg/dL — AB (ref 0–149)
VLDL Cholesterol Cal: 47 mg/dL — ABNORMAL HIGH (ref 5–40)

## 2019-02-25 LAB — TSH: TSH: 3.47 u[IU]/mL (ref 0.450–4.500)

## 2019-02-28 ENCOUNTER — Other Ambulatory Visit: Payer: Self-pay | Admitting: Emergency Medicine

## 2019-02-28 DIAGNOSIS — E039 Hypothyroidism, unspecified: Secondary | ICD-10-CM

## 2019-02-28 NOTE — Telephone Encounter (Signed)
Copied from CRM [id number]. Topic: Quick Communication - Rx Refill/Question >> Feb 28, 2019  3:05 PM Zada Girt, Lumin L wrote: Medication: cyclobenzaprine (FLEXERIL) 5 MG tablet, FLUoxetine (PROZAC) 40 MG capsule, levothyroxine (SYNTHROID, LEVOTHROID) 200 MCG tablet, pantoprazole (PROTONIX) 20 MG tablet (patient would like 2 or 3 refills on these so he doesn't have to call every month, and pantaprazole needs to be 60 tablets per month not 30 tablets)  Has the patient contacted their pharmacy? Yes.   (Agent: If no, request that the patient contact the pharmacy for the refill.) (Agent: If yes, when and what did the pharmacy advise?)  Preferred Pharmacy (with phone number or street name): [facility] #[id number] - [place], [place] - [address] AT [facility] OF [address] [place] [address] Phone: [phone] Fax: [phone]  Agent: Please be advised that RX refills may take up to 3 business days. We ask that you follow-up with your pharmacy.

## 2019-03-01 MED ORDER — FLUOXETINE HCL 40 MG PO CAPS: 40.0000 mg | ORAL_CAPSULE | Freq: Every day | ORAL | 0 refills | Status: DC

## 2019-03-01 MED ORDER — LEVOTHYROXINE SODIUM 200 MCG PO TABS: ORAL_TABLET | ORAL | 0 refills | Status: DC

## 2019-03-01 MED ORDER — PANTOPRAZOLE SODIUM 20 MG PO TBEC: DELAYED_RELEASE_TABLET | ORAL | 0 refills | Status: DC

## 2019-03-01 NOTE — Telephone Encounter (Signed)
Requested Prescriptions  Pending Prescriptions Disp Refills  . cyclobenzaprine (FLEXERIL) 5 MG tablet 90 tablet 1    Sig: Take 1 tablet (5 mg total) by mouth at bedtime.     Not Delegated - Analgesics:  Muscle Relaxants Failed - 03/01/2019  8:03 AM      Failed - This refill cannot be delegated      Passed - Valid encounter within last 6 months    Recent Outpatient Visits          5 days ago General weakness   Primary Care at [facility], [place], [place]   6 months ago Depression, unspecified depression type   Primary Care at [facility], [place], [place]   8 months ago Degenerative disc disease, lumbar   Primary Care at [facility], [place], [place]           . FLUoxetine (PROZAC) 40 MG capsule 90 capsule 3    Sig: Take 1 capsule (40 mg total) by mouth daily.     Psychiatry:  Antidepressants - SSRI Passed - 03/01/2019  8:03 AM      Passed - Completed PHQ-2 or PHQ-9 in the last 360 days.      Passed - Valid encounter within last 6 months    Recent Outpatient Visits          5 days ago General weakness   Primary Care at [facility], [place], [place]   6 months ago Depression, unspecified depression type   Primary Care at [facility], [place], [place]   8 months ago Degenerative disc disease, lumbar   Primary Care at [facility], [place], [place]           . levothyroxine (SYNTHROID, LEVOTHROID) 200 MCG tablet 30 tablet 0    Sig: TAKE 1 TABLET(200 MCG) BY MOUTH DAILY BEFORE BREAKFAST     Endocrinology:  Hypothyroid Agents Failed - 03/01/2019  8:03 AM      Failed - TSH needs to be rechecked within 3 months after an abnormal result. Refill until TSH is due.      Passed - TSH in normal range and within 360 days    TSH  Date Value Ref Range Status  02/24/2019 3.470 0.450 - 4.500 uIU/mL Final         Passed - Valid encounter within last 12 months    Recent Outpatient Visits          5 days ago General weakness   Primary Care at [facility], [place], [place]   6 months ago  Depression, unspecified depression type   Primary Care at [facility], [place], [place]   8 months ago Degenerative disc disease, lumbar   Primary Care at [facility], [place], [place]           . pantoprazole (PROTONIX) 20 MG tablet 180 tablet 0    Sig: TAKE 1 TABLET(20 MG) BY MOUTH TWICE DAILY     Gastroenterology: Proton Pump Inhibitors Passed - 03/01/2019  8:03 AM      Passed - Valid encounter within last 12 months    Recent Outpatient Visits          5 days ago General weakness   Primary Care at [facility], [place], [place]   6 months ago Depression, unspecified depression type   Primary Care at [facility], [place], [place]   8 months ago Degenerative disc disease, lumbar   Primary Care at [facility], [place], [place]

## 2019-03-01 NOTE — Telephone Encounter (Signed)
Requested Prescriptions  Pending Prescriptions Disp Refills  . pantoprazole (PROTONIX) 20 MG tablet 180 tablet 0    Sig: TAKE 1 TABLET(20 MG) BY MOUTH TWICE DAILY     There is no refill protocol information for this order    . FLUoxetine (PROZAC) 40 MG capsule 90 capsule 0    Sig: Take 1 capsule (40 mg total) by mouth daily.     There is no refill protocol information for this order    . levothyroxine (SYNTHROID, LEVOTHROID) 200 MCG tablet 90 tablet 0    Sig: TAKE 1 TABLET(200 MCG) BY MOUTH DAILY BEFORE BREAKFAST     There is no refill protocol information for this order    . cyclobenzaprine (FLEXERIL) 5 MG tablet 90 tablet 1    Sig: Take 1 tablet (5 mg total) by mouth at bedtime.     There is no refill protocol information for this order    Refused Prescriptions Disp Refills  . cyclobenzaprine (FLEXERIL) 5 MG tablet 90 tablet 1    Sig: Take 1 tablet (5 mg total) by mouth at bedtime.     Not Delegated - Analgesics:  Muscle Relaxants Failed - 03/01/2019  8:03 AM      Failed - This refill cannot be delegated      Passed - Valid encounter within last 6 months    Recent Outpatient Visits          5 days ago General weakness   Primary Care at [facility], [place], [place]   6 months ago Depression, unspecified depression type   Primary Care at [facility], [place], [place]   8 months ago Degenerative disc disease, lumbar   Primary Care at [facility], [place], [place]           . FLUoxetine (PROZAC) 40 MG capsule 90 capsule 0    Sig: Take 1 capsule (40 mg total) by mouth daily.     Psychiatry:  Antidepressants - SSRI Passed - 03/01/2019  8:03 AM      Passed - Completed PHQ-2 or PHQ-9 in the last 360 days.      Passed - Valid encounter within last 6 months    Recent Outpatient Visits          5 days ago General weakness   Primary Care at [facility], [place], [place]   6 months ago Depression, unspecified depression type   Primary Care at [facility], [place], [place]   8  months ago Degenerative disc disease, lumbar   Primary Care at [facility], [place], [place]           . levothyroxine (SYNTHROID, LEVOTHROID) 200 MCG tablet 90 tablet 0    Sig: TAKE 1 TABLET(200 MCG) BY MOUTH DAILY BEFORE BREAKFAST     Endocrinology:  Hypothyroid Agents Failed - 03/01/2019  8:03 AM      Failed - TSH needs to be rechecked within 3 months after an abnormal result. Refill until TSH is due.      Passed - TSH in normal range and within 360 days    TSH  Date Value Ref Range Status  02/24/2019 3.470 0.450 - 4.500 uIU/mL Final         Passed - Valid encounter within last 12 months    Recent Outpatient Visits          5 days ago General weakness   Primary Care at [facility], [place], [place]   6 months ago Depression, unspecified depression type  Primary Care at [facility], [place], [place]   8 months ago Degenerative disc disease, lumbar   Primary Care at [facility], [place], [place]           . pantoprazole (PROTONIX) 20 MG tablet 180 tablet 0    Sig: TAKE 1 TABLET(20 MG) BY MOUTH TWICE DAILY     Gastroenterology: Proton Pump Inhibitors Passed - 03/01/2019  8:03 AM      Passed - Valid encounter within last 12 months    Recent Outpatient Visits          5 days ago General weakness   Primary Care at [facility], [place], [place]   6 months ago Depression, unspecified depression type   Primary Care at [facility], [place], [place]   8 months ago Degenerative disc disease, lumbar   Primary Care at [facility], [place], [place]

## 2019-03-01 NOTE — Telephone Encounter (Signed)
Requested medication (s) are due for refill today: cyclobenzaprine yes  Requested medication (s) are on the active medication list: yes  Last refill:  08/21/18  Future visit scheduled: no  Notes to clinic:  Not delegated

## 2019-03-01 NOTE — Addendum Note (Signed)
Addended by: Redmond Baseman on: 03/01/2019 10:11 AM   Modules accepted: Orders

## 2019-03-03 ENCOUNTER — Encounter: Payer: BC Managed Care – PPO | Admitting: Internal Medicine

## 2019-03-11 ENCOUNTER — Telehealth: Payer: Self-pay | Admitting: *Deleted

## 2019-03-11 ENCOUNTER — Other Ambulatory Visit: Payer: Self-pay | Admitting: Internal Medicine

## 2019-03-11 ENCOUNTER — Other Ambulatory Visit: Payer: Self-pay | Admitting: Behavioral Health

## 2019-03-11 DIAGNOSIS — B20 Human immunodeficiency virus [HIV] disease: Secondary | ICD-10-CM

## 2019-03-11 MED ORDER — ELVITEG-COBIC-EMTRICIT-TENOFAF 150-150-200-10 MG PO TABS: ORAL_TABLET | ORAL | 1 refills | Status: DC

## 2019-03-11 NOTE — Telephone Encounter (Signed)
Per chart review for refill request, patient has transferred care to [facility] (notes available via Care Everywhere). Will notify [facility] as well. Andree Coss, RN

## 2019-03-31 ENCOUNTER — Other Ambulatory Visit: Payer: Self-pay | Admitting: Emergency Medicine

## 2019-03-31 DIAGNOSIS — E039 Hypothyroidism, unspecified: Secondary | ICD-10-CM

## 2019-04-04 ENCOUNTER — Other Ambulatory Visit: Payer: Self-pay | Admitting: Emergency Medicine

## 2019-04-04 NOTE — Telephone Encounter (Signed)
Requested medication (s) are due for refill today: Yes  Requested medication (s) are on the active medication list: Yes  Last refill:  08/21/18  Future visit scheduled: No  Notes to clinic:  Unable to refill, cannot delegate     Requested Prescriptions  Pending Prescriptions Disp Refills   cyclobenzaprine (FLEXERIL) 5 MG tablet [Pharmacy Med Name: CYCLOBENZAPRINE 5MG  TABLETS] 90 tablet 1    Sig: TAKE 1 TABLET(5 MG) BY MOUTH AT BEDTIME     Not Delegated - Analgesics:  Muscle Relaxants Failed - 04/04/2019  1:13 PM      Failed - This refill cannot be delegated      Passed - Valid encounter within last 6 months    Recent Outpatient Visits          1 month ago General weakness   Primary Care at [place], [place], [place]   7 months ago Depression, unspecified depression type   Primary Care at [facility], [place], [place]   9 months ago Degenerative disc disease, lumbar   Primary Care at [facility], [place], [place]

## 2019-04-12 ENCOUNTER — Telehealth: Payer: Self-pay | Admitting: Emergency Medicine

## 2019-04-12 NOTE — Telephone Encounter (Signed)
Find out what medications he needs refilled.  Thanks.

## 2019-04-12 NOTE — Telephone Encounter (Signed)
Patient states he needs med refill

## 2019-04-13 ENCOUNTER — Encounter: Payer: Self-pay | Admitting: *Deleted

## 2019-04-13 NOTE — Telephone Encounter (Signed)
My chart message sent to patient asking for clarification on which medications need refills

## 2019-04-21 ENCOUNTER — Telehealth: Payer: Self-pay | Admitting: Emergency Medicine

## 2019-04-21 MED ORDER — CYCLOBENZAPRINE HCL 5 MG PO TABS: 5.0000 mg | ORAL_TABLET | Freq: Every day | ORAL | 0 refills | Status: DC

## 2019-04-21 NOTE — Telephone Encounter (Signed)
mani last filled has been out for a month. Please advise.

## 2019-04-21 NOTE — Telephone Encounter (Signed)
Copied from CRM [id number]. Topic: Quick Communication - Rx Refill/Question >> Apr 21, 2019 10:33 AM Gwenlyn Fudge A wrote: Medication: cyclobenzaprine (FLEXERIL) 5 MG tablet   Has the patient contacted their pharmacy? Yes.  Pt is upset because he states he  did not get a call back regarding this medication. Please advise.  (Agent: If no, request that the patient contact the pharmacy for the refill.) (Agent: If yes, when and what did the pharmacy advise?)  Preferred Pharmacy (with phone number or street name): [facility] #[id number] - [place] - [address] AT [facility] OF [address] [place] [address] Phone: [phone] Fax: [phone] Open 24 hours    Agent: Please be advised that RX refills may take up to 3 business days. We ask that you follow-up with your pharmacy.

## 2019-04-21 NOTE — Telephone Encounter (Signed)
Please notify the patient that medication has been sent in. He needs to come in for an office visit

## 2019-04-22 NOTE — Telephone Encounter (Signed)
Informed pt of medication sent to pharmacy °

## 2019-05-12 ENCOUNTER — Other Ambulatory Visit: Payer: Self-pay | Admitting: Internal Medicine

## 2019-05-12 DIAGNOSIS — B20 Human immunodeficiency virus [HIV] disease: Secondary | ICD-10-CM

## 2019-05-26 ENCOUNTER — Other Ambulatory Visit: Payer: Self-pay | Admitting: Emergency Medicine

## 2019-05-27 ENCOUNTER — Other Ambulatory Visit: Payer: Self-pay | Admitting: Internal Medicine

## 2019-05-27 DIAGNOSIS — B20 Human immunodeficiency virus [HIV] disease: Secondary | ICD-10-CM

## 2019-06-17 ENCOUNTER — Ambulatory Visit: Payer: BC Managed Care – PPO | Admitting: Family Medicine

## 2019-07-01 ENCOUNTER — Ambulatory Visit: Payer: BC Managed Care – PPO | Admitting: Family Medicine

## 2019-07-01 ENCOUNTER — Encounter: Payer: Self-pay | Admitting: Family Medicine

## 2019-07-01 ENCOUNTER — Other Ambulatory Visit: Payer: Self-pay

## 2019-07-01 VITALS — BP 146/78 | HR 86 | Temp 97.8°F | Resp 17 | Ht 64.5 in | Wt 167.4 lb

## 2019-07-01 DIAGNOSIS — H9193 Unspecified hearing loss, bilateral: Secondary | ICD-10-CM | POA: Diagnosis not present

## 2019-07-01 DIAGNOSIS — G2581 Restless legs syndrome: Secondary | ICD-10-CM | POA: Diagnosis not present

## 2019-07-01 DIAGNOSIS — E039 Hypothyroidism, unspecified: Secondary | ICD-10-CM | POA: Diagnosis not present

## 2019-07-01 DIAGNOSIS — F331 Major depressive disorder, recurrent, moderate: Secondary | ICD-10-CM

## 2019-07-01 DIAGNOSIS — K219 Gastro-esophageal reflux disease without esophagitis: Secondary | ICD-10-CM

## 2019-07-01 MED ORDER — PANTOPRAZOLE SODIUM 20 MG PO TBEC: DELAYED_RELEASE_TABLET | ORAL | 3 refills | Status: DC

## 2019-07-01 MED ORDER — CYCLOBENZAPRINE HCL 5 MG PO TABS: 5.0000 mg | ORAL_TABLET | Freq: Every day | ORAL | 1 refills | Status: DC

## 2019-07-01 MED ORDER — CYCLOBENZAPRINE HCL 10 MG PO TABS: 10.0000 mg | ORAL_TABLET | Freq: Every day | ORAL | 3 refills | Status: DC

## 2019-07-01 MED ORDER — FLUOXETINE HCL 40 MG PO CAPS: 40.0000 mg | ORAL_CAPSULE | Freq: Every day | ORAL | 1 refills | Status: DC

## 2019-07-01 MED ORDER — LEVOTHYROXINE SODIUM 200 MCG PO TABS: ORAL_TABLET | ORAL | 3 refills | Status: DC

## 2019-07-01 NOTE — Progress Notes (Signed)
Established Patient Office Visit  Subjective:  Patient ID: Drew Jenkins, male    DOB: 11/30/1966  Age: 53 y.o. MRN: [id number]  CC:  Chief Complaint  Patient presents with  . Medication Management    pt needs refills for 90 day supply on flexeril pt takes 2 tabs at bedtime, prozac, synthroid, and protonix    HPI Drew Jenkins presents for   Restless Leg Patient takes 2 tablets of flexeril at bedtime and is requesting a 90 days supply for his restless leg  He states that otherwise he feels like he would walk all night long  Hypothyroidism: Patient presents for evaluation of thyroid function. Symptoms consist of denies fatigue, weight changes, heat/cold intolerance, bowel/skin changes or CVS symptoms.  Previous thyroid studies include TSH. The hypothyroidism is due to hypothyroidism.  Lab Results  Component Value Date   TSH 3.470 02/24/2019   Depression  He would like to get a refill of his prozac He is doing well with his mood He denies sleep disorder, down or depressed moods  He reports that he is low energy and would like his testosterone checked  [facility] 07/01/2019 02/24/2019 09/02/2018 08/21/2018 06/11/2018  Decreased Interest 0 0 0 1 0  Down, Depressed, Hopeless 0 0 0 1 0  PHQ - 2 Score 0 0 0 2 0  Altered sleeping - - - 0 -  Tired, decreased energy - - - 1 -  Change in appetite - - - 0 -  Feeling bad or failure about yourself  - - - 1 -  Trouble concentrating - - - 0 -  Moving slowly or fidgety/restless - - - 0 -  Suicidal thoughts - - - 0 -  PHQ-9 Score - - - 4 -  Difficult doing work/chores - - - - -  Some recent data might be hidden   GERD  He takes pantoprazole for reflux He denies any reflux symptoms His triggers are spicy foods or acidic foods  He avoids known triggers  Hearing loss  He reports progressive loss of his hearing and states that he is hard of hearing Starting in 2020 No history of trauma No tinnitus No new meds     Past Medical History:  Diagnosis Date  . HIV infection (HCC)     No past surgical history on file.  Family History  Problem Relation Age of Onset  . Diabetes Mother   . Hypertension Father     Social History   Socioeconomic History  . Marital status: Unknown    Spouse name: Not on file  . Number of children: Not on file  . Years of education: Not on file  . Highest education level: Not on file  Occupational History  . Not on file  Social Needs  . Financial resource strain: Not on file  . Food insecurity    Worry: Not on file    Inability: Not on file  . Transportation needs    Medical: Not on file    Non-medical: Not on file  Tobacco Use  . Smoking status: Never Smoker  . Smokeless tobacco: Never Used  Substance and Sexual Activity  . Alcohol use: No  . Drug use: No  . Sexual activity: Not Currently    Comment: pt declined condoms  Lifestyle  . Physical activity    Days per week: Not on file    Minutes per session: Not on file  . Stress: Not on file  Relationships  .  Social Herbalist on phone: Not on file    Gets together: Not on file    Attends religious service: Not on file    Active member of club or organization: Not on file    Attends meetings of clubs or organizations: Not on file    Relationship status: Not on file  . Intimate partner violence    Fear of current or ex partner: Not on file    Emotionally abused: Not on file    Physically abused: Not on file    Forced sexual activity: Not on file  Other Topics Concern  . Not on file  Social History Narrative  . Not on file    Outpatient Medications Prior to Visit  Medication Sig Dispense Refill  . elvitegravir-cobicistat-emtricitabine-tenofovir (GENVOYA) 150-150-200-10 MG TABS tablet TAKE 1 TABLET BY MOUTH DAILY WITH BREAKFAST. ([place]) 30 tablet 1  . cyclobenzaprine (FLEXERIL) 5 MG tablet Take 1 tablet (5 mg total) by mouth at bedtime. 90 tablet 0  . FLUoxetine (PROZAC) 40 MG capsule  Take 1 capsule (40 mg total) by mouth daily. 90 capsule 0  . levothyroxine (SYNTHROID, LEVOTHROID) 200 MCG tablet TAKE 1 TABLET BY MOUTH DAILY BEFORE BREAKFAST 90 tablet 3  . pantoprazole (PROTONIX) 20 MG tablet TAKE 1 TABLET(20 MG) BY MOUTH TWICE DAILY 180 tablet 0  . hydrOXYzine (ATARAX/VISTARIL) 25 MG tablet Take 1-2 tablets (25-50 mg total) by mouth at bedtime as needed. (Patient not taking: Reported on 02/24/2019) 30 tablet 0   No facility-administered medications prior to visit.     No Known Allergies  ROS Review of Systems Review of Systems  Constitutional: Negative for activity change, appetite change, chills and fever.  HENT: Negative for congestion, nosebleeds, trouble swallowing and voice change.   Respiratory: Negative for cough, shortness of breath and wheezing.   Gastrointestinal: Negative for diarrhea, nausea and vomiting.  Genitourinary: Negative for difficulty urinating, dysuria, flank pain and hematuria.  Musculoskeletal: Negative for back pain, joint swelling and neck pain.  Neurological: Negative for dizziness, speech difficulty, light-headedness and numbness.  See HPI. All other review of systems negative.     Objective:    Physical Exam  BP (!) 146/78 (BP Location: Right Arm, Patient Position: Sitting, Cuff Size: Normal)   Pulse 86   Temp 97.8 F (36.6 C) (Oral)   Resp 17   Ht 5' 4.5" (1.638 m)   Wt 167 lb 6.4 oz (75.9 kg)   SpO2 96%   BMI 28.29 kg/m  Wt Readings from Last 3 Encounters:  07/01/19 167 lb 6.4 oz (75.9 kg)  02/24/19 162 lb (73.5 kg)  09/02/18 164 lb (74.4 kg)   Physical Exam  Constitutional: Oriented to person, place, and time. Appears well-developed and well-nourished.  HENT:  Head: Normocephalic and atraumatic.  Eyes: Conjunctivae and EOM are normal.  Ears: TM wnl, no trauma or erythema Cardiovascular: Normal rate, regular rhythm, normal heart sounds and intact distal pulses.  No murmur heard. Pulmonary/Chest: Effort normal and  breath sounds normal. No stridor. No respiratory distress. Has no wheezes.  Neurological: Is alert and oriented to person, place, and time.  Skin: Skin is warm. Capillary refill takes less than 2 seconds.  Psychiatric: Has a normal mood and affect. Behavior is normal. Judgment and thought content normal.    There are no preventive care reminders to display for this patient.  There are no preventive care reminders to display for this patient.  Lab Results  Component Value Date  TSH 3.470 02/24/2019   Lab Results  Component Value Date   WBC 8.2 02/24/2019   HGB 15.6 02/24/2019   HCT 44.6 02/24/2019   MCV 86 02/24/2019   PLT 310 02/24/2019   Lab Results  Component Value Date   NA 142 02/24/2019   K 4.4 02/24/2019   CO2 23 02/24/2019   GLUCOSE 82 02/24/2019   BUN 10 02/24/2019   CREATININE 0.97 02/24/2019   BILITOT 0.8 02/24/2019   ALKPHOS 78 02/24/2019   AST 34 02/24/2019   ALT 36 02/24/2019   PROT 7.5 02/24/2019   ALBUMIN 4.4 02/24/2019   CALCIUM 9.4 02/24/2019   Lab Results  Component Value Date   CHOL 182 02/24/2019   Lab Results  Component Value Date   HDL 40 02/24/2019   Lab Results  Component Value Date   LDLCALC 95 02/24/2019   Lab Results  Component Value Date   TRIG 236 (H) 02/24/2019   Lab Results  Component Value Date   CHOLHDL 4.6 02/24/2019   Lab Results  Component Value Date   HGBA1C 4.7 04/21/2017      Assessment & Plan:   Problem List Items Addressed This Visit      Digestive   GERD (gastroesophageal reflux disease)- stable, continue to avoid food triggers    Relevant Medications   pantoprazole (PROTONIX) 20 MG tablet     Endocrine   Hypothyroidism, adult  -stable, at goal, cpm   Relevant Medications   levothyroxine (SYNTHROID) 200 MCG tablet     Other   Restless leg - stable continue flexeril    Depression - stable, continue prozac   Relevant Medications   FLUoxetine (PROZAC) 40 MG capsule    Other Visit Diagnoses     Bilateral hearing loss, unspecified hearing loss type    -  Primary Referral placed for ENT for audiology eval   Relevant Orders   Ambulatory referral to ENT      Meds ordered this encounter  Medications  . levothyroxine (SYNTHROID) 200 MCG tablet    Sig: TAKE 1 TABLET BY MOUTH DAILY BEFORE BREAKFAST    Dispense:  90 tablet    Refill:  3  . FLUoxetine (PROZAC) 40 MG capsule    Sig: Take 1 capsule (40 mg total) by mouth daily.    Dispense:  90 capsule    Refill:  1  . pantoprazole (PROTONIX) 20 MG tablet    Sig: Take one tablet by mouth daily    Dispense:  180 tablet    Refill:  3  . cyclobenzaprine (FLEXERIL) 5 MG tablet    Sig: Take 1 tablet (5 mg total) by mouth at bedtime.    Dispense:  90 tablet    Refill:  1    Follow-up: Return in about 6 months (around 01/01/2020) for thyroid recheck.    Doristine BosworthZoe A Mycala Warshawsky, MD

## 2019-07-01 NOTE — Patient Instructions (Addendum)
Continue to follow up with Infectious Disease as instructed Pick up prescriptions from pharmacy Return in one week to get testosterone levels checked    If you have lab work done today you will be contacted with your lab results within the next 2 weeks.  If you have not heard from [place] then please contact us. The fastest way to get your results is to register for My Chart.   IF you received an x-ray today, you will receive an invoice from [facility]. Please contact [facility] at [phone] with questions or concerns regarding your invoice.   IF you received labwork today, you will receive an invoice from [facility]. Please contact [facility] at [phone] with questions or concerns regarding your invoice.   Our billing staff will not be able to assist you with questions regarding bills from these companies.  You will be contacted with the lab results as soon as they are available. The fastest way to get your results is to activate your My Chart account. Instructions are located on the last page of this paperwork. If you have not heard from [place] regarding the results in 2 weeks, please contact this office.

## 2019-07-08 ENCOUNTER — Ambulatory Visit (INDEPENDENT_AMBULATORY_CARE_PROVIDER_SITE_OTHER): Payer: BC Managed Care – PPO | Admitting: Family Medicine

## 2019-07-08 ENCOUNTER — Other Ambulatory Visit: Payer: Self-pay

## 2019-07-08 DIAGNOSIS — R531 Weakness: Secondary | ICD-10-CM

## 2019-07-10 LAB — TESTOSTERONE, FREE, TOTAL, SHBG
Sex Hormone Binding: 39.1 nmol/L (ref 19.3–76.4)
Testosterone, Free: 8.2 pg/mL (ref 7.2–24.0)
Testosterone: 377 ng/dL (ref 264–916)

## 2019-08-28 ENCOUNTER — Other Ambulatory Visit: Payer: Self-pay | Admitting: Emergency Medicine

## 2019-11-21 ENCOUNTER — Ambulatory Visit: Payer: BC Managed Care – PPO

## 2019-11-21 ENCOUNTER — Ambulatory Visit
Admission: RE | Admit: 2019-11-21 | Discharge: 2019-11-21 | Disposition: A | Discharge status: Home / Self Care | Payer: BC Managed Care – PPO | Source: Ambulatory Visit | Attending: Family Medicine | Admitting: Family Medicine

## 2019-11-21 ENCOUNTER — Other Ambulatory Visit: Payer: Self-pay

## 2019-11-21 ENCOUNTER — Encounter: Payer: Self-pay | Admitting: Family Medicine

## 2019-11-21 ENCOUNTER — Telehealth: Payer: BC Managed Care – PPO | Admitting: Emergency Medicine

## 2019-11-21 ENCOUNTER — Telehealth (INDEPENDENT_AMBULATORY_CARE_PROVIDER_SITE_OTHER): Payer: BC Managed Care – PPO | Admitting: Family Medicine

## 2019-11-21 DIAGNOSIS — R05 Cough: Secondary | ICD-10-CM | POA: Diagnosis not present

## 2019-11-21 DIAGNOSIS — B20 Human immunodeficiency virus [HIV] disease: Secondary | ICD-10-CM

## 2019-11-21 DIAGNOSIS — R059 Cough, unspecified: Secondary | ICD-10-CM

## 2019-11-21 MED ORDER — PREDNISONE 20 MG PO TABS: ORAL_TABLET | ORAL | 0 refills | Status: DC

## 2019-11-21 MED ORDER — DOXYCYCLINE HYCLATE 100 MG PO TABS: 100.0000 mg | ORAL_TABLET | Freq: Two times a day (BID) | ORAL | 0 refills | Status: DC

## 2019-11-21 MED ORDER — ALBUTEROL SULFATE HFA 108 (90 BASE) MCG/ACT IN AERS: 2.0000 | INHALATION_SPRAY | RESPIRATORY_TRACT | 0 refills | Status: DC | PRN

## 2019-11-21 NOTE — Progress Notes (Signed)
Feeling tired Cough-1 month Did not go to work in the am- because of  cough and cold chilli

## 2019-11-21 NOTE — Progress Notes (Addendum)
Patient ID: Drew Jenkins, male    DOB: Jan 08, 1966  Age: 53 y.o. MRN: [id number]  No chief complaint on file.   Subjective:   Telemedicine visit was done with Drew Mage.  Explained to the patient that a telemedicine visit does not allow me to be examining him and he might need to go for an examination if symptoms persist.  There will be a charge for the visit.  The patient understands this.  He is in his vehicle in a safe place to talk.  He has had a cough persistently for the past month.  At times he wheezes some.  He is been treated with benzonatate from a walk-in clinic.  He does not smoke.  He does have a history of HIV for over a decade.  He takes his medicines regularly.  He did have a COVID-19 test done about 10 days ago on Saturday which was negative.  Cough is generally nonproductive.  No hemoptysis. Current allergies, medications, problem list, past/family and social histories reviewed.  Objective:  There were no vitals taken for this visit.  Not able to examine the patient.  Assessment & Plan:   Assessment: 1. Cough   2. History of HIV infection (HCC)       Plan: We will get a chest x-ray.  If it is okay will pretreat him with prednisone and doxycycline probably.  See chest x-ray order.  Gave him a work excuse for today.  Orders Placed This Encounter  Procedures  . DG Chest 2 View    Standing Status:   Future    Standing Expiration Date:   01/21/2021    Order Specific Question:   Reason for Exam (SYMPTOM  OR DIAGNOSIS REQUIRED)    Answer:   cough 1 month in patient with hiv history    Order Specific Question:   Preferred imaging location?    Answer:   [facility]    Order Specific Question:   Call Results- Best Contact Number?    Answer:   [phone]  Dr Alwyn Ren cell    Order Specific Question:   Radiology Contrast Protocol - do NOT remove file path    Answer:   \\[email or web address]    No orders of the defined types  were placed in this encounter.  cxr shows cardiomegaly but lungs okay.    Called in prescriptions and spoke with patient.  Asked him to go by for a repeat covid test also.  Janace Hoard MD      Patient Instructions   Drink plenty of fluids to keep well-hydrated.  Go to [facility] imaging for a chest x-ray today.  They were supposed to call me the results and I will try and get back to you this evening with regard to medications.  If you get worse go to the emergency room or an urgent care to get rechecked.      If you have lab work done today you will be contacted with your lab results within the next 2 weeks.  If you have not heard from [place] then please contact us. The fastest way to get your results is to register for My Chart.   IF you received an x-ray today, you will receive an invoice from [facility]. Please contact [facility] at [phone] with questions or concerns regarding your invoice.   IF you received labwork today, you will receive an invoice from [place]. Please contact [facility] at [phone] with questions or concerns regarding your invoice.   Our  billing staff will not be able to assist you with questions regarding bills from these companies.  You will be contacted with the lab results as soon as they are available. The fastest way to get your results is to activate your My Chart account. Instructions are located on the last page of this paperwork. If you have not heard from [place] regarding the results in 2 weeks, please contact this office.         No follow-ups on file.   Ruben Reason, MD 11/21/2019

## 2019-11-21 NOTE — Addendum Note (Signed)
Addended by: Dalynn Jhaveri H on: 11/21/2019 07:40 PM   Modules accepted: Orders

## 2019-11-21 NOTE — Patient Instructions (Addendum)
Drink plenty of fluids to keep well-hydrated.  Go to [facility] imaging for a chest x-ray today.  They were supposed to call me the results and I will try and get back to you this evening with regard to medications.  If you get worse go to the emergency room or an urgent care to get rechecked.      If you have lab work done today you will be contacted with your lab results within the next 2 weeks.  If you have not heard from [place] then please contact us. The fastest way to get your results is to register for My Chart.   IF you received an x-ray today, you will receive an invoice from [facility]. Please contact [facility] at [phone] with questions or concerns regarding your invoice.   IF you received labwork today, you will receive an invoice from [place]. Please contact [facility] at [phone] with questions or concerns regarding your invoice.   Our billing staff will not be able to assist you with questions regarding bills from these companies.  You will be contacted with the lab results as soon as they are available. The fastest way to get your results is to activate your My Chart account. Instructions are located on the last page of this paperwork. If you have not heard from [place] regarding the results in 2 weeks, please contact this office.

## 2019-12-05 ENCOUNTER — Other Ambulatory Visit: Payer: Self-pay | Admitting: Family Medicine

## 2019-12-05 DIAGNOSIS — R05 Cough: Secondary | ICD-10-CM

## 2019-12-05 DIAGNOSIS — R059 Cough, unspecified: Secondary | ICD-10-CM

## 2019-12-05 NOTE — Telephone Encounter (Signed)
Requested medication (s) are due for refill today: no  Requested medication (s) are on the active medication list: yes  Last refill: 11/22/2019  Future visit scheduled: yes  Notes to clinic:  One inhaler should last at least one month. If the patient is requesting refills earlier, contact the patient to check for uncontrolled symptoms   Requested Prescriptions  Pending Prescriptions Disp Refills   albuterol (VENTOLIN HFA) 108 (90 Base) MCG/ACT inhaler [Pharmacy Med Name: ALBUTEROL HFA INH(200 PUFFS)18GM] 18 g     Sig: INHALE 2 PUFFS BY MOUTH INTO THE LUNGS EVERY 4 HOURS AS NEEDED FOR WHEEZING OR SHORTNESS OF BREATH OR COUGH      Pulmonology:  Beta Agonists Failed - 12/05/2019  3:38 AM      Failed - One inhaler should last at least one month. If the patient is requesting refills earlier, contact the patient to check for uncontrolled symptoms.      Passed - Valid encounter within last 12 months    Recent Outpatient Visits           2 weeks ago Cough   Primary Care at [facility], Fenton Malling, MD   5 months ago General weakness   Primary Care at [facility], Lilia Argue, MD   5 months ago Bilateral hearing loss, unspecified hearing loss type   Primary Care at [facility], Arlie Solomons, MD   9 months ago General weakness   Primary Care at [facility], Ines Bloomer, MD   1 year ago Depression, unspecified depression type   Primary Care at [place], [place]       Future Appointments             In 1 month Forrest Moron, MD Primary Care at [place], [facility]

## 2020-01-04 ENCOUNTER — Other Ambulatory Visit: Payer: Self-pay | Admitting: Family Medicine

## 2020-01-06 ENCOUNTER — Other Ambulatory Visit: Payer: Self-pay

## 2020-01-06 ENCOUNTER — Telehealth: Payer: BC Managed Care – PPO | Admitting: Family Medicine

## 2020-01-10 ENCOUNTER — Ambulatory Visit: Payer: Self-pay

## 2020-01-10 ENCOUNTER — Telehealth: Payer: Self-pay | Admitting: Family Medicine

## 2020-01-10 NOTE — Telephone Encounter (Addendum)
Attempted to contact patient.  Left VM to return call to office. Patient reports positive exposure to COVID-19  Second attempt to contact patient. Per chart patient has spoken to office today and received information on testing locations. Left VM to return call for any further questions or concerns and to evaluate symptoms.  Third attempt to speak with patient. Left VM to return call to office for any questions or concerns. Per chart patien thas spoken to office today and received information on scheduling for COVID-19 testing.

## 2020-01-10 NOTE — Telephone Encounter (Signed)
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 Patient has no symptoms  and wanted info on where to be tested . Gave patient info FR

## 2020-01-11 ENCOUNTER — Other Ambulatory Visit: Payer: Self-pay

## 2020-01-11 ENCOUNTER — Telehealth (INDEPENDENT_AMBULATORY_CARE_PROVIDER_SITE_OTHER): Payer: BC Managed Care – PPO | Admitting: Family Medicine

## 2020-01-11 ENCOUNTER — Encounter: Payer: Self-pay | Admitting: Family Medicine

## 2020-01-11 DIAGNOSIS — G2581 Restless legs syndrome: Secondary | ICD-10-CM

## 2020-01-11 MED ORDER — GABAPENTIN 100 MG PO CAPS: 100.0000 mg | ORAL_CAPSULE | Freq: Every day | ORAL | 0 refills | Status: DC

## 2020-01-11 NOTE — Progress Notes (Signed)
Mask are difficult  to breath through. 6 m f/u.

## 2020-01-11 NOTE — Progress Notes (Signed)
Telemedicine Encounter- SOAP NOTE Established Patient  This telephone encounter was conducted with the patient's (or proxy's) verbal consent via audio telecommunications: yes/no: Yes Patient was instructed to have this encounter in a suitably private space; and to only have persons present to whom they give permission to participate. In addition, patient identity was confirmed by use of name plus two identifiers (DOB and address).  I discussed the limitations, risks, security and privacy concerns of performing an evaluation and management service by telephone and the availability of in person appointments. I also discussed with the patient that there may be a patient responsible charge related to this service. The patient expressed understanding and agreed to proceed.  I spent a total of TIME; 0 MIN TO 60 MIN: 15 minutes talking with the patient or their proxy.  CC: question about mask, RLS  Subjective   Drew Jenkins is a 54 y.o. established patient. 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 He is wondering if he should take the covid vaccine.   Patient reports that he has restless leg at night and sometimes takes an antihistamine.  He reports that it seems to be getting worse. He denies history of anemia. He denies cold intolerance.  Patient Active Problem List   Diagnosis Date Noted  . General weakness 02/24/2019  . Vaccine counseling 09/02/2018  . Medication monitoring encounter 03/11/2018  . Screening examination for venereal disease 04/09/2017  . Encounter for long-term (current) use of high-risk medication 04/09/2017  . Depression 11/20/2016  . GERD (gastroesophageal reflux disease) 11/20/2016  . 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 Hypothyroidism, adult 08/21/2016  . Restless leg 08/21/2016  . H/O syphilis 08/21/2016  . Overweight (BMI  25.0-29.9) 08/21/2016  . Abnormal anal Papanicolaou smear 08/21/2016  . Lumbago 08/21/2016    Past Medical History:  Diagnosis Date  . HIV infection ([place])     Current Outpatient Medications  Medication Sig Dispense Refill  . Bictegravir-Emtricitab-Tenofov (BIKTARVY PO) Take by mouth.    . cyclobenzaprine (FLEXERIL) 10 MG tablet Take 1 tablet (10 mg total) by mouth at bedtime. For restless leg 90 tablet 3  . FLUoxetine (PROZAC) 40 MG capsule TAKE 1 CAPSULE(40 MG) BY MOUTH DAILY 90 capsule 1  . levothyroxine (SYNTHROID) 200 MCG tablet TAKE 1 TABLET BY MOUTH DAILY BEFORE BREAKFAST 90 tablet 3  . pantoprazole (PROTONIX) 20 MG tablet Take one tablet by mouth daily 180 tablet 3  . albuterol (VENTOLIN HFA) 108 (90 Base) MCG/ACT inhaler INHALE 2 PUFFS BY MOUTH INTO THE LUNGS EVERY 4 HOURS AS NEEDED FOR WHEEZING OR SHORTNESS OF BREATH OR COUGH (Patient not taking: Reported on 01/11/2020) 18 g 1  . gabapentin (NEURONTIN) 100 MG capsule Take 1-3 capsules (100-300 mg total) by mouth at bedtime. 90 capsule 0   No current facility-administered medications for this visit.    No Known Allergies  Social History   Socioeconomic History  . Marital status: Unknown    Spouse name: Not on file  . Number of children: Not on file  . Years of education: Not on file  . Highest education level: Not on file  Occupational History  . Not on file  Tobacco Use  . Smoking status: Never Smoker  . Smokeless tobacco: Never Used  Substance and Sexual Activity  . Alcohol use: No  . Drug use: No  .  Sexual activity: Not Currently    Comment: pt declined condoms  Other Topics Concern  . Not on file  Social History Narrative  . Not on file   Social Determinants of Health   Financial Resource Strain:   . Difficulty of Paying Living Expenses: Not on file  Food Insecurity:   . Worried About Programme researcher, broadcasting/film/video in the Last Year: Not on file  . Ran Out of Food in the Last Year: Not on file  Transportation  Needs:   . Lack of Transportation (Medical): Not on file  . Lack of Transportation (Non-Medical): Not on file  Physical Activity:   . Days of Exercise per Week: Not on file  . Minutes of Exercise per Session: Not on file  Stress:   . Feeling of Stress : Not on file  Social Connections:   . Frequency of Communication with Friends and Family: Not on file  . Frequency of Social Gatherings with Friends and Family: Not on file  . Attends Religious Services: Not on file  . Active Member of Clubs or Organizations: Not on file  . Attends Banker Meetings: Not on file  . Marital Status: Not on file  Intimate Partner Violence:   . Fear of Current or Ex-Partner: Not on file  . Emotionally Abused: Not on file  . Physically Abused: Not on file  . Sexually Abused: Not on file    ROS  Objective   Vitals as reported by the patient: There were no vitals filed for this visit.  Diagnoses and all orders for this visit:  RLS (restless legs syndrome)  Other orders -     gabapentin (NEURONTIN) 100 MG capsule; Take 1-3 capsules (100-300 mg total) by mouth at bedtime.   Will give a titrating dose of gabapentin Follow up prn   I discussed the assessment and treatment plan with the patient. The patient was provided an opportunity to ask questions and all were answered. The patient agreed with the plan and demonstrated an understanding of the instructions.   The patient was advised to call back or seek an in-person evaluation if the symptoms worsen or if the condition fails to improve as anticipated.  I provided 15 minutes of non-face-to-face time during this encounter.  Doristine Bosworth, MD  Primary Care at [facility]

## 2020-01-11 NOTE — Patient Instructions (Signed)
° ° ° °  If you have lab work done today you will be contacted with your lab results within the next 2 weeks.  If you have not heard from us then please contact us. The fastest way to get your results is to register for My Chart. ° ° °IF you received an x-ray today, you will receive an invoice from [facility]. Please contact [facility] at [phone] with questions or concerns regarding your invoice.  ° °IF you received labwork today, you will receive an invoice from [facility]. Please contact [facility] at [phone] with questions or concerns regarding your invoice.  ° °Our billing staff will not be able to assist you with questions regarding bills from these companies. ° °You will be contacted with the lab results as soon as they are available. The fastest way to get your results is to activate your My Chart account. Instructions are located on the last page of this paperwork. If you have not heard from us regarding the results in 2 weeks, please contact this office. °  ° ° ° °

## 2020-01-12 ENCOUNTER — Telehealth: Payer: Self-pay | Admitting: Family Medicine

## 2020-01-12 NOTE — Telephone Encounter (Signed)
01/12/2020 - PATIENT HAD A VIRTUAL VISIT WITH DR. Creta Levin ON WED. (01/11/2020). DR. Creta Levin HAS SUGGESTED PATIENT HAVE A COMPLETE PHYSICAL IN 3 MONTHS. I TRIED TO CALL AND SCHEDULE BUT HAD TO LEAVE HIM A VOICE MAIL TO RETURN MY CALL. MBC

## 2020-01-12 NOTE — Progress Notes (Signed)
Lvmtcb and scheduled cpe in 3 months with pcp

## 2020-01-18 ENCOUNTER — Encounter: Payer: Self-pay | Admitting: Family Medicine

## 2020-01-19 ENCOUNTER — Emergency Department (HOSPITAL_COMMUNITY): Payer: BC Managed Care – PPO

## 2020-01-19 ENCOUNTER — Emergency Department (HOSPITAL_COMMUNITY)
Admission: EM | Admit: 2020-01-19 | Discharge: 2020-01-20 | Disposition: A | Discharge status: Home / Self Care | Payer: BC Managed Care – PPO | Attending: Emergency Medicine | Admitting: Emergency Medicine

## 2020-01-19 ENCOUNTER — Other Ambulatory Visit: Payer: Self-pay

## 2020-01-19 ENCOUNTER — Encounter (HOSPITAL_COMMUNITY): Payer: Self-pay

## 2020-01-19 DIAGNOSIS — Z21 Asymptomatic human immunodeficiency virus [HIV] infection status: Secondary | ICD-10-CM | POA: Insufficient documentation

## 2020-01-19 DIAGNOSIS — I10 Essential (primary) hypertension: Secondary | ICD-10-CM | POA: Insufficient documentation

## 2020-01-19 DIAGNOSIS — R109 Unspecified abdominal pain: Secondary | ICD-10-CM | POA: Diagnosis not present

## 2020-01-19 DIAGNOSIS — Z79899 Other long term (current) drug therapy: Secondary | ICD-10-CM | POA: Insufficient documentation

## 2020-01-19 DIAGNOSIS — E119 Type 2 diabetes mellitus without complications: Secondary | ICD-10-CM | POA: Insufficient documentation

## 2020-01-19 LAB — URINALYSIS, ROUTINE W REFLEX MICROSCOPIC
Bacteria, UA: NONE SEEN
Glucose, UA: NEGATIVE mg/dL
Hgb urine dipstick: NEGATIVE
Ketones, ur: 5 mg/dL — AB
Leukocytes,Ua: NEGATIVE
Nitrite: NEGATIVE
Protein, ur: 30 mg/dL — AB
Specific Gravity, Urine: 1.04 — ABNORMAL HIGH (ref 1.005–1.030)
pH: 5 (ref 5.0–8.0)

## 2020-01-19 MED ORDER — ONDANSETRON HCL 4 MG/2ML IJ SOLN
4.0000 mg | Freq: Once | INTRAMUSCULAR | Status: AC
  Administered 2020-01-20: 4 mg via INTRAVENOUS
  Filled 2020-01-19: qty 2

## 2020-01-19 MED ORDER — HYDROMORPHONE HCL 1 MG/ML IJ SOLN
1.0000 mg | Freq: Once | INTRAMUSCULAR | Status: AC
  Administered 2020-01-20: 1 mg via INTRAVENOUS
  Filled 2020-01-19: qty 1

## 2020-01-19 MED ORDER — SODIUM CHLORIDE 0.9 % IV BOLUS
1000.0000 mL | Freq: Once | INTRAVENOUS | Status: AC
  Administered 2020-01-20: 1000 mL via INTRAVENOUS

## 2020-01-19 NOTE — ED Provider Notes (Signed)
[facility]-EMERGENCY DEPT Provider Note: Lowella Dell, MD, FACEP  CSN: [id number] MRN: [id number] ARRIVAL: 01/19/20 at 2142 ROOM: WA23/WA23   CHIEF COMPLAINT  Flank Pain   HISTORY OF PRESENT ILLNESS  01/19/20 10:44 PM Drew Jenkins is a 54 y.o. male who thinks he may have a remote history of ureterolithiasis.  He is on Biktarvy for HIV disease.  He is here with left flank pain that began for 5 days ago.  The pain had been waxing and waning but became severe this evening.  He rates it as a 10 out of 10 and is only minimally changed with movement.  Is that it is spasmodic in nature.  He denies associated nausea or vomiting.  He has not noticed hematuria.   Past Medical History:  Diagnosis Date  . HIV infection (HCC)     History reviewed. No pertinent surgical history.  Family History  Problem Relation Age of Onset  . Diabetes Mother   . Hypertension Father     Social History   Tobacco Use  . Smoking status: Never Smoker  . Smokeless tobacco: Never Used  Substance Use Topics  . Alcohol use: No  . Drug use: No    Prior to Admission medications   Medication Sig Start Date End Date Taking? Authorizing Provider  Bictegravir-Emtricitab-Tenofov (BIKTARVY PO) Take 1 tablet by mouth daily.    Yes [provider]  cholecalciferol (VITAMIN D3) 25 MCG (1000 UNIT) tablet Take 1,000 Units by mouth daily.   Yes [provider]  cyclobenzaprine (FLEXERIL) 10 MG tablet Take 1 tablet (10 mg total) by mouth at bedtime. For restless leg 07/01/19  Yes Stallings, Zoe A, MD  FLUoxetine (PROZAC) 40 MG capsule TAKE 1 CAPSULE(40 MG) BY MOUTH DAILY Patient taking differently: Take 40 mg by mouth daily.  01/04/20  Yes Collie Siad A, MD  gabapentin (NEURONTIN) 100 MG capsule Take 1-3 capsules (100-300 mg total) by mouth at bedtime. 01/11/20  Yes Doristine Bosworth, MD  levothyroxine (SYNTHROID) 200 MCG tablet TAKE 1 TABLET BY MOUTH DAILY BEFORE BREAKFAST Patient taking differently:  Take 200 mcg by mouth daily before breakfast.  07/01/19  Yes Stallings, Zoe A, MD  pantoprazole (PROTONIX) 20 MG tablet Take one tablet by mouth daily Patient taking differently: Take 20 mg by mouth daily.  07/01/19  Yes Stallings, Zoe A, MD  albuterol (VENTOLIN HFA) 108 (90 Base) MCG/ACT inhaler INHALE 2 PUFFS BY MOUTH INTO THE LUNGS EVERY 4 HOURS AS NEEDED FOR WHEEZING OR SHORTNESS OF BREATH OR COUGH Patient not taking: Reported on 01/11/2020 12/05/19   Peyton Najjar, MD    Allergies Patient has no known allergies.   REVIEW OF SYSTEMS  Negative except as noted here or in the History of Present Illness.   PHYSICAL EXAMINATION  Initial Vital Signs Blood pressure (!) 141/99, pulse 84, temperature 98.4 F (36.9 C), temperature source Oral, resp. rate 16, SpO2 99 %.  Examination General: Well-developed, well-nourished male in no acute distress; appearance consistent with age of record HENT: normocephalic; atraumatic Eyes: pupils equal, round and reactive to light; extraocular muscles intact Neck: supple Heart: regular rate and rhythm Lungs: clear to auscultation bilaterally Abdomen: soft; nondistended; nontender; bowel sounds present GU: Left CVA tenderness Extremities: No deformity; full range of motion; pulses normal Neurologic: Awake, alert and oriented; motor function intact in all extremities and symmetric; no facial droop Skin: Warm and dry Psychiatric: Grimacing   RESULTS  Summary of this visit's results, reviewed and interpreted by myself:  EKG Interpretation  Date/Time:    Ventricular Rate:    PR Interval:    QRS Duration:   QT Interval:    QTC Calculation:   R Axis:     Text Interpretation:        Laboratory Studies: Results for orders placed or performed during the hospital encounter of 01/19/20 (from the past 24 hour(s))  Urinalysis, Routine w reflex microscopic     Status: Abnormal   Collection Time: 01/19/20  9:47 PM  Result Value Ref Range   Color,  Urine AMBER (A) YELLOW   APPearance CLEAR CLEAR   Specific Gravity, Urine 1.040 (H) 1.005 - 1.030   pH 5.0 5.0 - 8.0   Glucose, UA NEGATIVE NEGATIVE mg/dL   Hgb urine dipstick NEGATIVE NEGATIVE   Bilirubin Urine SMALL (A) NEGATIVE   Ketones, ur 5 (A) NEGATIVE mg/dL   Protein, ur 30 (A) NEGATIVE mg/dL   Nitrite NEGATIVE NEGATIVE   Leukocytes,Ua NEGATIVE NEGATIVE   RBC / HPF 0-5 0 - 5 RBC/hpf   WBC, UA 0-5 0 - 5 WBC/hpf   Bacteria, UA NONE SEEN NONE SEEN   Mucus PRESENT   Rapid urine drug screen (hospital performed)     Status: None   Collection Time: 01/19/20  9:47 PM  Result Value Ref Range   Opiates NONE DETECTED NONE DETECTED   Cocaine NONE DETECTED NONE DETECTED   Benzodiazepines NONE DETECTED NONE DETECTED   Amphetamines NONE DETECTED NONE DETECTED   Tetrahydrocannabinol NONE DETECTED NONE DETECTED   Barbiturates NONE DETECTED NONE DETECTED  Comprehensive metabolic panel     Status: Abnormal   Collection Time: 01/19/20 11:58 PM  Result Value Ref Range   Sodium 137 135 - 145 mmol/L   Potassium 3.9 3.5 - 5.1 mmol/L   Chloride 105 98 - 111 mmol/L   CO2 25 22 - 32 mmol/L   Glucose, Bld 127 (H) 70 - 99 mg/dL   BUN 13 6 - 20 mg/dL   Creatinine, Ser 0.98 0.61 - 1.24 mg/dL   Calcium 9.2 8.9 - 10.3 mg/dL   Total Protein 7.0 6.5 - 8.1 g/dL   Albumin 4.0 3.5 - 5.0 g/dL   AST 30 15 - 41 U/L   ALT 33 0 - 44 U/L   Alkaline Phosphatase 65 38 - 126 U/L   Total Bilirubin 1.2 0.3 - 1.2 mg/dL   GFR calc non Af Amer >60 >60 mL/min   GFR calc Af Amer >60 >60 mL/min   Anion gap 7 5 - 15  Lipase, blood     Status: None   Collection Time: 01/19/20 11:58 PM  Result Value Ref Range   Lipase 29 11 - 51 U/L  CBC with Differential (PNL)     Status: None   Collection Time: 01/19/20 11:58 PM  Result Value Ref Range   WBC 8.2 4.0 - 10.5 K/uL   RBC 5.17 4.22 - 5.81 MIL/uL   Hemoglobin 15.3 13.0 - 17.0 g/dL   HCT 43.8 39.0 - 52.0 %   MCV 84.7 80.0 - 100.0 fL   MCH 29.6 26.0 - 34.0 pg    MCHC 34.9 30.0 - 36.0 g/dL   RDW 12.4 11.5 - 15.5 %   Platelets 305 150 - 400 K/uL   nRBC 0.0 0.0 - 0.2 %   Neutrophils Relative % 44 %   Neutro Abs 3.6 1.7 - 7.7 K/uL   Lymphocytes Relative 43 %   Lymphs Abs 3.6 0.7 - 4.0 K/uL   Monocytes  Relative 10 %   Monocytes Absolute 0.8 0.1 - 1.0 K/uL   Eosinophils Relative 2 %   Eosinophils Absolute 0.2 0.0 - 0.5 K/uL   Basophils Relative 1 %   Basophils Absolute 0.0 0.0 - 0.1 K/uL   Immature Granulocytes 0 %   Abs Immature Granulocytes 0.03 0.00 - 0.07 K/uL   Imaging Studies: CT Renal Stone Study  Result Date: 01/19/2020 CLINICAL DATA:  Left flank pain x2 days. EXAM: CT ABDOMEN AND PELVIS WITHOUT CONTRAST TECHNIQUE: Multidetector CT imaging of the abdomen and pelvis was performed following the standard protocol without IV contrast. COMPARISON:  None. FINDINGS: Lower chest: No acute abnormality. Hepatobiliary: No focal liver abnormality is seen. No gallstones, gallbladder wall thickening, or biliary dilatation. Pancreas: Unremarkable. No pancreatic ductal dilatation or surrounding inflammatory changes. Spleen: Normal in size without focal abnormality. Adrenals/Urinary Tract: Adrenal glands are unremarkable. Kidneys are normal, without renal calculi, focal lesion, or hydronephrosis. The urinary bladder is contracted and subsequently limited in evaluation. Stomach/Bowel: There is a small hiatal hernia. The appendix is not visualized. No evidence of bowel wall thickening, distention, or inflammatory changes. Vascular/Lymphatic: No significant vascular findings are present. No enlarged abdominal or pelvic lymph nodes. Reproductive: Prostate is unremarkable. Other: No abdominal wall hernia or abnormality. No abdominopelvic ascites. Musculoskeletal: No acute or significant osseous findings. IMPRESSION: 1. Small hiatal hernia. 2. No evidence of renal calculi. Electronically Signed   By: Aram Candela M.D.   On: 01/19/2020 23:32    ED COURSE and MDM    Nursing notes, initial and subsequent vitals signs, including pulse oximetry, reviewed and interpreted by myself.  Vitals:   01/19/20 2223 01/19/20 2332 01/19/20 2336  BP: (!) 141/99 126/83   Pulse: 84 82 77  Resp: 16 (!) 23 19  Temp: 98.4 F (36.9 C)    TempSrc: Oral    SpO2: 99% 99% 99%   Medications  ondansetron (ZOFRAN) injection 4 mg (4 mg Intravenous Given 01/20/20 0002)  HYDROmorphone (DILAUDID) injection 1 mg (1 mg Intravenous Given 01/20/20 0002)  sodium chloride 0.9 % bolus 1,000 mL (1,000 mLs Intravenous New Bag/Given 01/20/20 0004)   12:45 AM No evidence of renal stone on CT or hematuria on urinalysis.  Patient son Proteus inhibitors often have radiolucent stones but there should be evidence of hydroureter/hydronephrosis and/or hematuria.  I suspect his pain is due to musculoskeletal or radicular etiology.   PROCEDURES  Procedures   ED DIAGNOSES     ICD-10-CM   1. Left flank pain  R10.9        Jakeline Dave, Jonny Ruiz, MD 01/20/20 [phone]

## 2020-01-19 NOTE — ED Triage Notes (Signed)
Patient brought in by [facility] from home complaining of left flank pain that started two days ago. Patient states it has gotten worse and he can not take it anymore.

## 2020-01-20 LAB — COMPREHENSIVE METABOLIC PANEL
ALT: 33 U/L (ref 0–44)
AST: 30 U/L (ref 15–41)
Albumin: 4 g/dL (ref 3.5–5.0)
Alkaline Phosphatase: 65 U/L (ref 38–126)
Anion gap: 7 (ref 5–15)
BUN: 13 mg/dL (ref 6–20)
CO2: 25 mmol/L (ref 22–32)
Calcium: 9.2 mg/dL (ref 8.9–10.3)
Chloride: 105 mmol/L (ref 98–111)
Creatinine, Ser: 0.98 mg/dL (ref 0.61–1.24)
GFR calc Af Amer: >60 mL/min (ref >60)
GFR calc non Af Amer: >60 mL/min (ref >60)
Glucose, Bld: 127 mg/dL — ABNORMAL HIGH (ref 70–99)
Potassium: 3.9 mmol/L (ref 3.5–5.1)
Sodium: 137 mmol/L (ref 135–145)
Total Bilirubin: 1.2 mg/dL (ref 0.3–1.2)
Total Protein: 7 g/dL (ref 6.5–8.1)

## 2020-01-20 LAB — CBC WITH DIFFERENTIAL/PLATELET
Abs Immature Granulocytes: 0.03 10*3/uL (ref 0.00–0.07)
Basophils Absolute: 0 10*3/uL (ref 0.0–0.1)
Basophils Relative: 1 %
Eosinophils Absolute: 0.2 10*3/uL (ref 0.0–0.5)
Eosinophils Relative: 2 %
HCT: 43.8 % (ref 39.0–52.0)
Hemoglobin: 15.3 g/dL (ref 13.0–17.0)
Immature Granulocytes: 0 %
Lymphocytes Relative: 43 %
Lymphs Abs: 3.6 10*3/uL (ref 0.7–4.0)
MCH: 29.6 pg (ref 26.0–34.0)
MCHC: 34.9 g/dL (ref 30.0–36.0)
MCV: 84.7 fL (ref 80.0–100.0)
Monocytes Absolute: 0.8 10*3/uL (ref 0.1–1.0)
Monocytes Relative: 10 %
Neutro Abs: 3.6 10*3/uL (ref 1.7–7.7)
Neutrophils Relative %: 44 %
Platelets: 305 10*3/uL (ref 150–400)
RBC: 5.17 MIL/uL (ref 4.22–5.81)
RDW: 12.4 % (ref 11.5–15.5)
WBC: 8.2 10*3/uL (ref 4.0–10.5)
nRBC: 0 % (ref 0.0–0.2)

## 2020-01-20 LAB — RAPID URINE DRUG SCREEN, HOSP PERFORMED
Amphetamines: NOT DETECTED
Barbiturates: NOT DETECTED
Benzodiazepines: NOT DETECTED
Cocaine: NOT DETECTED
Opiates: NOT DETECTED
Tetrahydrocannabinol: NOT DETECTED

## 2020-01-20 LAB — LIPASE, BLOOD: Lipase: 29 U/L (ref 11–51)

## 2020-01-20 MED ORDER — OXYCODONE-ACETAMINOPHEN 5-325 MG PO TABS: 1.0000 | ORAL_TABLET | ORAL | 0 refills | Status: DC | PRN

## 2020-01-20 NOTE — Telephone Encounter (Signed)
Pt would like you to call him and set up appt.  Pt went to ED last night and would like to discuss further with Dr Creta Levin. Please call [phone]

## 2020-02-10 ENCOUNTER — Other Ambulatory Visit: Payer: Self-pay | Admitting: Family Medicine

## 2020-02-10 NOTE — Telephone Encounter (Signed)
Requested Prescriptions  Pending Prescriptions Disp Refills  . gabapentin (NEURONTIN) 100 MG capsule [Pharmacy Med Name: GABAPENTIN 100MG  CAPSULES] 90 capsule 3    Sig: TAKE 1 TO 3 CAPSULES(100 TO 300 MG) BY MOUTH AT BEDTIME     Neurology: Anticonvulsants - gabapentin Passed - 02/10/2020  3:38 AM      Passed - Valid encounter within last 12 months    Recent Outpatient Visits          1 month ago RLS (restless legs syndrome)   Primary Care at [facility], [facility], MD   2 months ago Cough   Primary Care at [facility], [facility], MD   7 months ago General weakness   Primary Care at [facility], [facility], MD   7 months ago Bilateral hearing loss, unspecified hearing loss type   Primary Care at [facility], [facility], MD   11 months ago General weakness   Primary Care at [facility], [facility], MD      Future Appointments            In 1 month Eilleen Kempf, MD Primary Care at [place], [facility]

## 2020-02-11 ENCOUNTER — Ambulatory Visit: Payer: BC Managed Care – PPO | Attending: Internal Medicine

## 2020-02-11 DIAGNOSIS — Z23 Encounter for immunization: Secondary | ICD-10-CM | POA: Insufficient documentation

## 2020-02-11 NOTE — Progress Notes (Signed)
   Covid-19 Vaccination Clinic  Name:  Drew Jenkins    MRN: [id number] DOB: 1966-01-27  02/11/2020  [name] was observed post Covid-19 immunization for 15 minutes without incidence. He was provided with Vaccine Information Sheet and instruction to access the V-Safe system.   [name] was instructed to call 911 with any severe reactions post vaccine: Marland Kitchen Difficulty breathing  . Swelling of your face and throat  . A fast heartbeat  . A bad rash all over your body  . Dizziness and weakness    Immunizations Administered    Name Date Dose VIS Date Route   Pfizer COVID-19 Vaccine 02/11/2020  3:06 PM 0.3 mL 11/25/2019 Intramuscular   Manufacturer: [employer], [employer]   Lot: [id number]   NDC: [address]-8

## 2020-03-03 ENCOUNTER — Ambulatory Visit: Payer: BC Managed Care – PPO | Attending: Internal Medicine

## 2020-03-03 DIAGNOSIS — Z23 Encounter for immunization: Secondary | ICD-10-CM

## 2020-03-03 NOTE — Progress Notes (Signed)
   Covid-19 Vaccination Clinic  Name:  Yovanny Coats    MRN: [id number] DOB: Mar 07, 1966  03/03/2020  Mr. Fortner was observed post Covid-19 immunization for 15 minutes without incident. He was provided with Vaccine Information Sheet and instruction to access the V-Safe system.   Mr. Cass was instructed to call 911 with any severe reactions post vaccine: Marland Kitchen Difficulty breathing  . Swelling of face and throat  . A fast heartbeat  . A bad rash all over body  . Dizziness and weakness   Immunizations Administered    Name Date Dose VIS Date Route   Pfizer COVID-19 Vaccine 03/03/2020  8:32 AM 0.3 mL 11/25/2019 Intramuscular   Manufacturer: [employer], [employer]   Lot: [id number]   NDC: [address]-8

## 2020-03-12 ENCOUNTER — Ambulatory Visit: Payer: BC Managed Care – PPO | Admitting: Family Medicine

## 2020-03-12 ENCOUNTER — Other Ambulatory Visit: Payer: Self-pay

## 2020-03-12 VITALS — BP 122/84 | HR 78 | Temp 98.0°F | Ht 64.5 in | Wt 166.0 lb

## 2020-03-12 DIAGNOSIS — E039 Hypothyroidism, unspecified: Secondary | ICD-10-CM | POA: Diagnosis not present

## 2020-03-12 DIAGNOSIS — Z1211 Encounter for screening for malignant neoplasm of colon: Secondary | ICD-10-CM | POA: Diagnosis not present

## 2020-03-12 DIAGNOSIS — Z13220 Encounter for screening for lipoid disorders: Secondary | ICD-10-CM

## 2020-03-12 DIAGNOSIS — Z0000 Encounter for general adult medical examination without abnormal findings: Secondary | ICD-10-CM

## 2020-03-12 DIAGNOSIS — E781 Pure hyperglyceridemia: Secondary | ICD-10-CM | POA: Diagnosis not present

## 2020-03-12 DIAGNOSIS — G2581 Restless legs syndrome: Secondary | ICD-10-CM

## 2020-03-12 MED ORDER — GABAPENTIN 300 MG PO CAPS: 300.0000 mg | ORAL_CAPSULE | Freq: Every day | ORAL | 3 refills | Status: DC

## 2020-03-12 MED ORDER — PANTOPRAZOLE SODIUM 20 MG PO TBEC: 20.0000 mg | DELAYED_RELEASE_TABLET | Freq: Every day | ORAL | 3 refills | Status: DC

## 2020-03-12 NOTE — Progress Notes (Signed)
Pt states he is feeling fatigued a lot, and his pinched nerve is doing better today. Per pt  "It has flair ups every now and then".

## 2020-03-12 NOTE — Progress Notes (Signed)
Established Patient Office Visit  Subjective:  Patient ID: Drew Jenkins, male    DOB: 1966/09/03  Age: 54 y.o. MRN: [id number]  CC:  Chief Complaint  Patient presents with  . Hypothyroidism    pt states feeling fatigued, but no other symptoms.  . Gastroesophageal Reflux    pt states this condition seems well controled.    HPI Drew Jenkins presents for   Hypothyroidism: Patient presents for evaluation of thyroid function. Symptoms consist of denies fatigue, weight changes, heat/cold intolerance, bowel/skin changes or CVS symptoms.  The problem has been stable.  Previous thyroid studies include TSH. The hypothyroidism is due to hypothyroidism. Lab Results  Component Value Date   TSH 3.470 02/24/2019    Restless leg syndrome Pt initially had relief with Gabapentin 17m capsule at bedtime Flexeril is not taken regularly He would like an increase in the gabapentin to 3032mdaily He reports that he is able to sleep but it is still interrupted sleep  Dyslipidemia: Patient presents for evaluation of lipids.  Compliance with treatment thus far has been good.  A repeat fasting lipid profile was done.  The patient does use medications that may worsen dyslipidemias (corticosteroids, progestins, anabolic steroids, diuretics, beta-blockers, amiodarone, cyclosporine, olanzapine). The patient exercises intermittently.  The patient is not known to have coexisting coronary artery disease.      Past Medical History:  Diagnosis Date  . HIV infection (HCNectar    No past surgical history on file.  Family History  Problem Relation Age of Onset  . Diabetes Mother   . Hypertension Father     Social History   Socioeconomic History  . Marital status: Unknown    Spouse name: Not on file  . Number of children: Not on file  . Years of education: Not on file  . Highest education level: Not on file  Occupational History  . Not on file  Tobacco Use  . Smoking status: Never Smoker  .  Smokeless tobacco: Never Used  Substance and Sexual Activity  . Alcohol use: No  . Drug use: No  . Sexual activity: Not Currently    Comment: pt declined condoms  Other Topics Concern  . Not on file  Social History Narrative  . Not on file   Social Determinants of Health   Financial Resource Strain:   . Difficulty of Paying Living Expenses:   Food Insecurity:   . Worried About RuCharity fundraisern the Last Year:   . RaArboriculturistn the Last Year:   Transportation Needs:   . LaFilm/video editorMedical):   . Marland Kitchenack of Transportation (Non-Medical):   Physical Activity:   . Days of Exercise per Week:   . Minutes of Exercise per Session:   Stress:   . Feeling of Stress :   Social Connections:   . Frequency of Communication with Friends and Family:   . Frequency of Social Gatherings with Friends and Family:   . Attends Religious Services:   . Active Member of Clubs or Organizations:   . Attends ClArchivisteetings:   . Marland Kitchenarital Status:   Intimate Partner Violence:   . Fear of Current or Ex-Partner:   . Emotionally Abused:   . Marland Kitchenhysically Abused:   . Sexually Abused:     Outpatient Medications Prior to Visit  Medication Sig Dispense Refill  . Bictegravir-Emtricitab-Tenofov (BIKTARVY PO) Take 1 tablet by mouth daily.     . cholecalciferol (VITAMIN D3)  25 MCG (1000 UNIT) tablet Take 1,000 Units by mouth daily.    . cyclobenzaprine (FLEXERIL) 10 MG tablet Take 1 tablet (10 mg total) by mouth at bedtime. For restless leg 90 tablet 3  . FLUoxetine (PROZAC) 40 MG capsule TAKE 1 CAPSULE(40 MG) BY MOUTH DAILY (Patient taking differently: Take 40 mg by mouth daily. ) 90 capsule 1  . levothyroxine (SYNTHROID) 200 MCG tablet TAKE 1 TABLET BY MOUTH DAILY BEFORE BREAKFAST (Patient taking differently: Take 200 mcg by mouth daily before breakfast. ) 90 tablet 3  . oxyCODONE-acetaminophen (PERCOCET) 5-325 MG tablet Take 1 tablet by mouth every 4 (four) hours as needed for  severe pain. 30 tablet 0  . gabapentin (NEURONTIN) 100 MG capsule TAKE 1 TO 3 CAPSULES(100 TO 300 MG) BY MOUTH AT BEDTIME 90 capsule 3  . pantoprazole (PROTONIX) 20 MG tablet Take one tablet by mouth daily (Patient taking differently: Take 20 mg by mouth daily. ) 180 tablet 3   No facility-administered medications prior to visit.    No Known Allergies  ROS Review of Systems Review of Systems  Constitutional: Negative for activity change, appetite change, chills and fever.  HENT: Negative for congestion, nosebleeds, trouble swallowing and voice change.   Respiratory: Negative for cough, shortness of breath and wheezing.   Gastrointestinal: Negative for diarrhea, nausea and vomiting.  Genitourinary: Negative for difficulty urinating, dysuria, flank pain and hematuria.  Musculoskeletal: Negative for back pain, joint swelling and neck pain.  Neurological: Negative for dizziness, speech difficulty, light-headedness and numbness.  See HPI. All other review of systems negative.     Objective:    Physical Exam  BP 122/84   Pulse 78   Temp 98 F (36.7 C) (Temporal)   Ht 5' 4.5" (1.638 m)   Wt 166 lb (75.3 kg)   SpO2 98%   BMI 28.05 kg/m  Wt Readings from Last 3 Encounters:  03/12/20 166 lb (75.3 kg)  07/01/19 167 lb 6.4 oz (75.9 kg)  02/24/19 162 lb (73.5 kg)   Physical Exam  Constitutional: Oriented to person, place, and time. Appears well-developed and well-nourished.  HENT:  Head: Normocephalic and atraumatic.  Eyes: Conjunctivae and EOM are normal.  Cardiovascular: Normal rate, regular rhythm, normal heart sounds and intact distal pulses.  No murmur heard. Pulmonary/Chest: Effort normal and breath sounds normal. No stridor. No respiratory distress. Has no wheezes.  Neurological: Is alert and oriented to person, place, and time.  Skin: Skin is warm. Capillary refill takes less than 2 seconds.  Psychiatric: Has a normal mood and affect. Behavior is normal. Judgment and  thought content normal.    Health Maintenance Due  Topic Date Due  . COLONOSCOPY  Never done    There are no preventive care reminders to display for this patient.  Lab Results  Component Value Date   TSH 3.470 02/24/2019   Lab Results  Component Value Date   WBC 8.2 01/19/2020   HGB 15.3 01/19/2020   HCT 43.8 01/19/2020   MCV 84.7 01/19/2020   PLT 305 01/19/2020   Lab Results  Component Value Date   NA 137 01/19/2020   K 3.9 01/19/2020   CO2 25 01/19/2020   GLUCOSE 127 (H) 01/19/2020   BUN 13 01/19/2020   CREATININE 0.98 01/19/2020   BILITOT 1.2 01/19/2020   ALKPHOS 65 01/19/2020   AST 30 01/19/2020   ALT 33 01/19/2020   PROT 7.0 01/19/2020   ALBUMIN 4.0 01/19/2020   CALCIUM 9.2 01/19/2020   ANIONGAP  7 01/19/2020   Lab Results  Component Value Date   CHOL 182 02/24/2019   Lab Results  Component Value Date   HDL 40 02/24/2019   Lab Results  Component Value Date   LDLCALC 95 02/24/2019   Lab Results  Component Value Date   TRIG 236 (H) 02/24/2019   Lab Results  Component Value Date   CHOLHDL 4.6 02/24/2019   Lab Results  Component Value Date   HGBA1C 4.7 04/21/2017      Assessment & Plan:   Problem List Items Addressed This Visit      Endocrine   Hypothyroidism, adult   Relevant Orders   Lipid panel   TSH   CMP14+EGFR    Other Visit Diagnoses    Screening for cholesterol level    -  Primary   Routine health maintenance       Screening for colon cancer       Relevant Orders   Cologuard   Hypertriglyceridemia    - advised to avoid fried foods, if not improved will have to give a medication Advised to increase his fiber     Relevant Orders   Lipid panel   RLS (restless legs syndrome)    -  Increased gabapentin to 361m    Relevant Medications   gabapentin (NEURONTIN) 300 MG capsule      Meds ordered this encounter  Medications  . pantoprazole (PROTONIX) 20 MG tablet    Sig: Take 1 tablet (20 mg total) by mouth daily.     Dispense:  90 tablet    Refill:  3  . gabapentin (NEURONTIN) 300 MG capsule    Sig: Take 1 capsule (300 mg total) by mouth at bedtime.    Dispense:  90 capsule    Refill:  3    Follow-up: Return in about 6 months (around 09/12/2020) for labs and med check for thyroid and lipids.    ZForrest Moron MD

## 2020-03-12 NOTE — Patient Instructions (Addendum)
   If you have lab work done today you will be contacted with your lab results within the next 2 weeks.  If you have not heard from us then please contact us. The fastest way to get your results is to register for My Chart.   IF you received an x-ray today, you will receive an invoice from [facility]. Please contact [facility] at [phone] with questions or concerns regarding your invoice.   IF you received labwork today, you will receive an invoice from [facility]. Please contact [facility] at [phone] with questions or concerns regarding your invoice.   Our billing staff will not be able to assist you with questions regarding bills from these companies.  You will be contacted with the lab results as soon as they are available. The fastest way to get your results is to activate your My Chart account. Instructions are located on the last page of this paperwork. If you have not heard from us regarding the results in 2 weeks, please contact this office.     Colorectal Cancer Screening  Colorectal cancer screening is a group of tests that are used to check for colorectal cancer before symptoms develop. Colorectal refers to the colon and rectum. The colon and rectum are located at the end of the digestive tract and carry bowel movements out of the body. Who should have screening? All adults starting at age 50 until age 75 should have screening. Your health care provider may recommend screening at age 45. You will have tests every 1-10 years, depending on your results and the type of screening test. You may have screening tests starting at an earlier age, or more frequently than other people, if you have any of the following risk factors:  A personal or family history of colorectal cancer or abnormal growths (polyps).  Inflammatory bowel disease, such as ulcerative colitis or Crohn's disease.  A history of having radiation treatment to the abdomen or pelvic area for  cancer.  Colorectal cancer symptoms, such as changes in bowel habits or blood in your stool.  A type of colon cancer syndrome that is passed from parent to child (hereditary), such as: ? Lynch syndrome. ? Familial adenomatous polyposis. ? Turcot syndrome. ? Peutz-Jeghers syndrome. Screening recommendations for adults who are 75-85 years old vary depending on health. How is screening done? There are several types of colorectal screening tests. You may have one or more of the following:  Guaiac-based fecal occult blood testing. For this test, a stool (feces) sample is checked for hidden (occult) blood, which could be a sign of colorectal cancer.  Fecal immunochemical test (FIT). For this test, a stool sample is checked for blood, which could be a sign of colorectal cancer.  Stool DNA test. For this test, a stool sample is checked for blood and changes in DNA that could lead to colorectal cancer.  Sigmoidoscopy. During this test, a thin, flexible tube with a camera on the end (sigmoidoscope) is used to examine the rectum and the lower colon.  Colonoscopy. During this test, a long, flexible tube with a camera on the end (colonoscope) is used to examine the entire colon and rectum. With a colonoscopy, it is possible to take a sample of tissue (biopsy) and remove small polyps during the test.  Virtual colonoscopy. Instead of a colonoscope, this type of colonoscopy uses X-rays (CT scan) and computers to produce images of the colon and rectum. What are the benefits of screening? Screening reduces your risk   for colorectal cancer and can help identify cancer at an early stage, when the cancer can be removed or treated more easily. It is common for polyps to form in the lining of the colon, especially as you age. These polyps may be cancerous or become cancerous over time. Screening can identify these polyps. What are the risks of screening? Each screening test may have different risks.  Stool  sample tests have fewer risks than other types of screening tests. However, you may need more tests to confirm results from a stool sample test.  Screening tests that involve X-rays expose you to low levels of radiation, which may slightly increase your cancer risk. The benefit of detecting cancer outweighs the slight increase in risk.  Screening tests such as sigmoidoscopy and colonoscopy may place you at risk for bleeding, intestinal damage, infection, or a reaction to medicines given during the exam. Talk with your health care provider to understand your risk for colorectal cancer and to make a screening plan that is right for you. Questions to ask your health care provider  When should I start colorectal cancer screening?  What is my risk for colorectal cancer?  How often do I need screening?  Which screening tests do I need?  How do I get my test results?  What do my results mean? Where to find more information Learn more about colorectal cancer screening from:  The American Cancer Society: www.cancer.org  The National Cancer Institute: www.cancer.gov Summary  Colorectal cancer screening is a group of tests used to check for colorectal cancer before symptoms develop.  Screening reduces your risk for colorectal cancer and can help identify cancer at an early stage, when the cancer can be removed or treated more easily.  All adults starting at age 50 until age 75 should have screening. Your health care provider may recommend screening at age 45.  You may have screening tests starting at an earlier age, or more frequently than other people, if you have certain risk factors.  Talk with your health care provider to understand your risk for colorectal cancer and to make a screening plan that is right for you. This information is not intended to replace advice given to you by your health care provider. Make sure you discuss any questions you have with your health care  provider. Document Revised: 03/23/2019 Document Reviewed: 09/02/2017 Elsevier Patient Education  2020 Elsevier Inc.  

## 2020-03-13 LAB — CMP14+EGFR
ALT: 24 IU/L (ref 0–44)
AST: 23 IU/L (ref 0–40)
Albumin/Globulin Ratio: 1.4 (ref 1.2–2.2)
Albumin: 4 g/dL (ref 3.8–4.9)
Alkaline Phosphatase: 83 IU/L (ref 39–117)
BUN/Creatinine Ratio: 5 — ABNORMAL LOW (ref 9–20)
BUN: 5 mg/dL — ABNORMAL LOW (ref 6–24)
Bilirubin Total: 0.9 mg/dL (ref 0.0–1.2)
CO2: 24 mmol/L (ref 20–29)
Calcium: 9.3 mg/dL (ref 8.7–10.2)
Chloride: 103 mmol/L (ref 96–106)
Creatinine, Ser: 1 mg/dL (ref 0.76–1.27)
GFR calc Af Amer: 98 mL/min/{1.73_m2} (ref >59)
GFR calc non Af Amer: 84 mL/min/{1.73_m2} (ref >59)
Globulin, Total: 2.9 g/dL (ref 1.5–4.5)
Glucose: 85 mg/dL (ref 65–99)
Potassium: 4.3 mmol/L (ref 3.5–5.2)
Sodium: 139 mmol/L (ref 134–144)
Total Protein: 6.9 g/dL (ref 6.0–8.5)

## 2020-03-13 LAB — LIPID PANEL
Chol/HDL Ratio: 3.9 ratio (ref 0.0–5.0)
Cholesterol, Total: 177 mg/dL (ref 100–199)
HDL: 45 mg/dL (ref >39)
LDL Chol Calc (NIH): 114 mg/dL — ABNORMAL HIGH (ref 0–99)
Triglycerides: 99 mg/dL (ref 0–149)
VLDL Cholesterol Cal: 18 mg/dL (ref 5–40)

## 2020-03-13 LAB — TSH: TSH: 0.282 u[IU]/mL — ABNORMAL LOW (ref 0.450–4.500)

## 2020-03-21 ENCOUNTER — Other Ambulatory Visit: Payer: Self-pay | Admitting: Family Medicine

## 2020-03-21 DIAGNOSIS — E039 Hypothyroidism, unspecified: Secondary | ICD-10-CM

## 2020-03-21 MED ORDER — LEVOTHYROXINE SODIUM 175 MCG PO TABS: ORAL_TABLET | ORAL | 1 refills | Status: DC

## 2020-03-22 ENCOUNTER — Encounter: Payer: Self-pay | Admitting: Radiology

## 2020-03-26 ENCOUNTER — Telehealth: Payer: Self-pay | Admitting: Family Medicine

## 2020-03-26 NOTE — Telephone Encounter (Signed)
Pt would like a cb concerning his most recent lab results. Please advise at [phone].

## 2020-03-27 ENCOUNTER — Telehealth: Payer: Self-pay | Admitting: Family Medicine

## 2020-03-27 NOTE — Telephone Encounter (Addendum)
03/27/2020 - MARY WAS SUPPOSE TO CALL Drew Jenkins WHEN DR. Creta Levin CALENDER OPENED UP FOR HIS 6 MONTH FOLLOW-UP ON HIS LABS, MEDICATION, THYROID AND LIPID CHECK. SHE SAW HIM ON 03/12/2020. I CALLED HIM TO LET HIM KNOW DR. Creta Levin WILL BE LEAVING PRIMARY CARE AT [place] IN MAY 2021. I ASKED HIM IF HE WOULD LIKE TO FOLLOW HER OR ESTABLISH CARE WITH ANOTHER ONE OF OUR PROVIDERS. HE SAID HE NEEDED TO THINK ABOUT IT AND THAT HE WOULD CALL BACK AFTER HE DECIDED. MBC

## 2020-03-29 NOTE — Telephone Encounter (Signed)
Spoke with pt via phone re: questions about levothyroxine medication causing him to be sleepy. Advised pt per stallings  the thyroid levels was in the hyperthyroid range. This means his dose of medication was too high. I refilled his medication at a slightly lower dose of of levothyroxine. He should follow up for recheck in six months. Pt advises he discontinued the 200 mcg but had not p/u the levothyroxine 175 mcg.  Advised all other labs normal.  Pt had question about glucose advised it was 85 and normal.  Pt appreciative of c/b.

## 2020-04-05 LAB — COLOGUARD: Cologuard: NEGATIVE

## 2020-06-08 ENCOUNTER — Encounter: Payer: Self-pay | Admitting: Registered Nurse

## 2020-06-08 ENCOUNTER — Ambulatory Visit: Payer: BC Managed Care – PPO | Admitting: Registered Nurse

## 2020-06-08 ENCOUNTER — Other Ambulatory Visit: Payer: Self-pay

## 2020-06-08 VITALS — BP 137/87 | HR 68 | Temp 97.9°F | Resp 17 | Ht 64.5 in | Wt 171.8 lb

## 2020-06-08 DIAGNOSIS — G2581 Restless legs syndrome: Secondary | ICD-10-CM | POA: Diagnosis not present

## 2020-06-08 DIAGNOSIS — F331 Major depressive disorder, recurrent, moderate: Secondary | ICD-10-CM

## 2020-06-08 DIAGNOSIS — K219 Gastro-esophageal reflux disease without esophagitis: Secondary | ICD-10-CM

## 2020-06-08 DIAGNOSIS — E039 Hypothyroidism, unspecified: Secondary | ICD-10-CM

## 2020-06-08 DIAGNOSIS — R5383 Other fatigue: Secondary | ICD-10-CM

## 2020-06-08 MED ORDER — PANTOPRAZOLE SODIUM 20 MG PO TBEC: 20.0000 mg | DELAYED_RELEASE_TABLET | Freq: Every day | ORAL | 3 refills | Status: DC

## 2020-06-08 MED ORDER — FLUOXETINE HCL 40 MG PO CAPS: 40.0000 mg | ORAL_CAPSULE | Freq: Every day | ORAL | 3 refills | Status: DC

## 2020-06-08 MED ORDER — GABAPENTIN 300 MG PO CAPS: 300.0000 mg | ORAL_CAPSULE | Freq: Every day | ORAL | 3 refills | Status: DC

## 2020-06-08 NOTE — Progress Notes (Signed)
Established Patient Office Visit  Subjective:  Patient ID: Drew Jenkins, male    DOB: 1966/11/28  Age: 54 y.o. MRN: [id number]  CC:  Chief Complaint  Patient presents with  . Medication Refill    Patient states he needs a medication refill and also to get his thyroids checked.    HPI Drew Jenkins presents for 3 mo TSH check  Was recently reduced from PO qd to PO qd, still having some fatigue, otherwise asymptomatic.   Needs refill on Gabapentin 300mg  PO qhs for RLS, pantoprazole 40mg  PO qd for GERD, and fluoxetine 40mg  PO qd for anxiety with depression. Tolerating all well. Getting therapeutic effect without AEs.  Follows with ID for HIV infection - reports things are going well on that front.   Past Medical History:  Diagnosis Date  . HIV infection (HCC)     No past surgical history on file.  Family History  Problem Relation Age of Onset  . Diabetes Mother   . Hypertension Father     Social History   Socioeconomic History  . Marital status: Unknown    Spouse name: Not on file  . Number of children: Not on file  . Years of education: Not on file  . Highest education level: Not on file  Occupational History  . Not on file  Tobacco Use  . Smoking status: Never Smoker  . Smokeless tobacco: Never Used  Substance and Sexual Activity  . Alcohol use: No  . Drug use: No  . Sexual activity: Not Currently    Comment: pt declined condoms  Other Topics Concern  . Not on file  Social History Narrative  . Not on file   Social Determinants of Health   Financial Resource Strain:   . Difficulty of Paying Living Expenses:   Food Insecurity:   . Worried About in the Last Year:   . in the Last Year:   Transportation Needs:   . (Medical):   Programme researcher, broadcasting/film/video Lack of Transportation (Non-Medical):   Physical Activity:   . Days of Exercise per Week:   . Minutes of Exercise per Session:   Stress:   . Feeling  of Stress :   Social Connections:   . Frequency of Communication with Friends and Family:   . Frequency of Social Gatherings with Friends and Family:   . Attends Religious Services:   . Active Member of Clubs or Organizations:   . Attends Barista Meetings:   Freight forwarder Marital Status:   Intimate Partner Violence:   . Fear of Current or Ex-Partner:   . Emotionally Abused:   Marland Kitchen Physically Abused:   . Sexually Abused:     Outpatient Medications Prior to Visit  Medication Sig Dispense Refill  . Bictegravir-Emtricitab-Tenofov (BIKTARVY PO) Take 1 tablet by mouth daily.     Banker levothyroxine (SYNTHROID) 175 MCG tablet TAKE 1 TABLET BY MOUTH DAILY BEFORE BREAKFAST 90 tablet 1  . FLUoxetine (PROZAC) 40 MG capsule TAKE 1 CAPSULE(40 MG) BY MOUTH DAILY (Patient taking differently: Take 40 mg by mouth daily. ) 90 capsule 1  . gabapentin (NEURONTIN) 300 MG capsule Take 1 capsule (300 mg total) by mouth at bedtime. 90 capsule 3  . pantoprazole (PROTONIX) 20 MG tablet Take 1 tablet (20 mg total) by mouth daily. 90 tablet 3  . cholecalciferol (VITAMIN D3) 25 MCG (1000 UNIT) tablet Take 1,000 Units by mouth daily.    Marland Kitchen  cyclobenzaprine (FLEXERIL) 10 MG tablet Take 1 tablet (10 mg total) by mouth at bedtime. For restless leg (Patient not taking: Reported on 06/08/2020) 90 tablet 3  . oxyCODONE-acetaminophen (PERCOCET) 5-325 MG tablet Take 1 tablet by mouth every 4 (four) hours as needed for severe pain. 30 tablet 0   No facility-administered medications prior to visit.    No Known Allergies  ROS Review of Systems  Constitutional: Positive for fatigue. Negative for activity change, appetite change, chills, diaphoresis, fever and unexpected weight change.  HENT: Negative.   Eyes: Negative.   Respiratory: Negative.   Cardiovascular: Negative.   Gastrointestinal: Negative.   Endocrine: Negative.   Genitourinary: Negative.   Musculoskeletal: Negative.   Skin: Negative.   Allergic/Immunologic:  Negative.   Neurological: Negative.   Hematological: Negative.   Psychiatric/Behavioral: Negative.   All other systems reviewed and are negative.     Objective:    Physical Exam Vitals and nursing note reviewed.  Constitutional:      General: He is not in acute distress.    Appearance: Normal appearance. He is normal weight. He is not ill-appearing, toxic-appearing or diaphoretic.  Cardiovascular:     Rate and Rhythm: Normal rate and regular rhythm.  Pulmonary:     Effort: Pulmonary effort is normal. No respiratory distress.  Skin:    General: Skin is warm and dry.  Neurological:     General: No focal deficit present.     Mental Status: He is alert and oriented to person, place, and time. Mental status is at baseline.  Psychiatric:        Mood and Affect: Mood normal.        Behavior: Behavior normal.        Thought Content: Thought content normal.        Judgment: Judgment normal.     BP 137/87   Pulse 68   Temp 97.9 F (36.6 C) (Temporal)   Resp 17   Ht 5' 4.5" (1.638 m)   Wt 171 lb 12.8 oz (77.9 kg)   SpO2 97%   BMI 29.03 kg/m  Wt Readings from Last 3 Encounters:  06/08/20 171 lb 12.8 oz (77.9 kg)  03/12/20 166 lb (75.3 kg)  07/01/19 167 lb 6.4 oz (75.9 kg)     There are no preventive care reminders to display for this patient.  There are no preventive care reminders to display for this patient.  Lab Results  Component Value Date   TSH 0.282 (L) 03/12/2020   Lab Results  Component Value Date   WBC 8.2 01/19/2020   HGB 15.3 01/19/2020   HCT 43.8 01/19/2020   MCV 84.7 01/19/2020   PLT 305 01/19/2020   Lab Results  Component Value Date   NA 139 03/12/2020   K 4.3 03/12/2020   CO2 24 03/12/2020   GLUCOSE 85 03/12/2020   BUN 5 (L) 03/12/2020   CREATININE 1.00 03/12/2020   BILITOT 0.9 03/12/2020   ALKPHOS 83 03/12/2020   AST 23 03/12/2020   ALT 24 03/12/2020   PROT 6.9 03/12/2020   ALBUMIN 4.0 03/12/2020   CALCIUM 9.3 03/12/2020   ANIONGAP  7 01/19/2020   Lab Results  Component Value Date   CHOL 177 03/12/2020   Lab Results  Component Value Date   HDL 45 03/12/2020   Lab Results  Component Value Date   LDLCALC 114 (H) 03/12/2020   Lab Results  Component Value Date   TRIG 99 03/12/2020   Lab Results  Component  Value Date   CHOLHDL 3.9 03/12/2020   Lab Results  Component Value Date   HGBA1C 4.7 04/21/2017      Assessment & Plan:   Problem List Items Addressed This Visit      Digestive   GERD (gastroesophageal reflux disease) - Primary   Relevant Medications   pantoprazole (PROTONIX) 20 MG tablet     Endocrine   Hypothyroidism, adult   Relevant Orders   Thyroid Panel With TSH     Other   Depression   Relevant Medications   FLUoxetine (PROZAC) 40 MG capsule    Other Visit Diagnoses    RLS (restless legs syndrome)       Relevant Medications   gabapentin (NEURONTIN) 300 MG capsule   Other fatigue       Relevant Orders   CBC   Comprehensive metabolic panel   Vitamin D, 25-hydroxy   Vitamin B12   Magnesium      Meds ordered this encounter  Medications  . FLUoxetine (PROZAC) 40 MG capsule    Sig: Take 1 capsule (40 mg total) by mouth daily.    Dispense:  90 capsule    Refill:  3  . gabapentin (NEURONTIN) 300 MG capsule    Sig: Take 1 capsule (300 mg total) by mouth at bedtime.    Dispense:  90 capsule    Refill:  3  . pantoprazole (PROTONIX) 20 MG tablet    Sig: Take 1 tablet (20 mg total) by mouth daily.    Dispense:  90 tablet    Refill:  3    Follow-up: No follow-ups on file.   PLAN  Will draw labs for thyroid and fatigue - will follow up and adjust levothyroxine dose as warranted  Refills on other meds given x 1 year  Continue follow up with ID as scheduled  Patient encouraged to call clinic with any questions, comments, or concerns.  Janeece Agee, NP

## 2020-06-08 NOTE — Patient Instructions (Signed)
° ° ° °  If you have lab work done today you will be contacted with your lab results within the next 2 weeks.  If you have not heard from us then please contact us. The fastest way to get your results is to register for My Chart. ° ° °IF you received an x-ray today, you will receive an invoice from [facility]. Please contact [facility] at [phone] with questions or concerns regarding your invoice.  ° °IF you received labwork today, you will receive an invoice from [facility]. Please contact [facility] at [phone] with questions or concerns regarding your invoice.  ° °Our billing staff will not be able to assist you with questions regarding bills from these companies. ° °You will be contacted with the lab results as soon as they are available. The fastest way to get your results is to activate your My Chart account. Instructions are located on the last page of this paperwork. If you have not heard from us regarding the results in 2 weeks, please contact this office. °  ° ° ° °

## 2020-06-09 LAB — CBC
Hematocrit: 43.9 % (ref 37.5–51.0)
Hemoglobin: 15.7 g/dL (ref 13.0–17.7)
MCH: 29.5 pg (ref 26.6–33.0)
MCHC: 35.8 g/dL — ABNORMAL HIGH (ref 31.5–35.7)
MCV: 83 fL (ref 79–97)
Platelets: 277 10*3/uL (ref 150–450)
RBC: 5.32 x10E6/uL (ref 4.14–5.80)
RDW: 13.2 % (ref 11.6–15.4)
WBC: 7.9 10*3/uL (ref 3.4–10.8)

## 2020-06-09 LAB — MAGNESIUM: Magnesium: 2 mg/dL (ref 1.6–2.3)

## 2020-06-09 LAB — COMPREHENSIVE METABOLIC PANEL
ALT: 37 IU/L (ref 0–44)
AST: 30 IU/L (ref 0–40)
Albumin/Globulin Ratio: 1.7 (ref 1.2–2.2)
Albumin: 4.2 g/dL (ref 3.8–4.9)
Alkaline Phosphatase: 93 IU/L (ref 48–121)
BUN/Creatinine Ratio: 8 — ABNORMAL LOW (ref 9–20)
BUN: 8 mg/dL (ref 6–24)
Bilirubin Total: 1.5 mg/dL — ABNORMAL HIGH (ref 0.0–1.2)
CO2: 23 mmol/L (ref 20–29)
Calcium: 8.9 mg/dL (ref 8.7–10.2)
Chloride: 104 mmol/L (ref 96–106)
Creatinine, Ser: 0.95 mg/dL (ref 0.76–1.27)
GFR calc Af Amer: 104 mL/min/{1.73_m2} (ref >59)
GFR calc non Af Amer: 90 mL/min/{1.73_m2} (ref >59)
Globulin, Total: 2.5 g/dL (ref 1.5–4.5)
Glucose: 85 mg/dL (ref 65–99)
Potassium: 4.3 mmol/L (ref 3.5–5.2)
Sodium: 138 mmol/L (ref 134–144)
Total Protein: 6.7 g/dL (ref 6.0–8.5)

## 2020-06-09 LAB — VITAMIN B12: Vitamin B-12: 395 pg/mL (ref 232–1245)

## 2020-06-09 LAB — THYROID PANEL WITH TSH
Free Thyroxine Index: 2.3 (ref 1.2–4.9)
T3 Uptake Ratio: 27 % (ref 24–39)
T4, Total: 8.7 ug/dL (ref 4.5–12.0)
TSH: 2.17 u[IU]/mL (ref 0.450–4.500)

## 2020-06-09 LAB — VITAMIN D 25 HYDROXY (VIT D DEFICIENCY, FRACTURES): Vit D, 25-Hydroxy: 51.7 ng/mL (ref 30.0–100.0)

## 2020-06-11 ENCOUNTER — Other Ambulatory Visit: Payer: Self-pay | Admitting: Registered Nurse

## 2020-06-11 DIAGNOSIS — E039 Hypothyroidism, unspecified: Secondary | ICD-10-CM

## 2020-06-11 MED ORDER — LEVOTHYROXINE SODIUM 175 MCG PO TABS: ORAL_TABLET | ORAL | 1 refills | Status: DC

## 2020-07-02 ENCOUNTER — Other Ambulatory Visit: Payer: BC Managed Care – PPO

## 2020-07-02 ENCOUNTER — Other Ambulatory Visit: Payer: Self-pay

## 2020-07-02 DIAGNOSIS — Z113 Encounter for screening for infections with a predominantly sexual mode of transmission: Secondary | ICD-10-CM

## 2020-07-02 DIAGNOSIS — Z79899 Other long term (current) drug therapy: Secondary | ICD-10-CM

## 2020-07-02 DIAGNOSIS — B20 Human immunodeficiency virus [HIV] disease: Secondary | ICD-10-CM

## 2020-07-04 ENCOUNTER — Other Ambulatory Visit (HOSPITAL_COMMUNITY)
Admission: RE | Admit: 2020-07-04 | Discharge: 2020-07-04 | Disposition: A | Discharge status: Home / Self Care | Payer: BC Managed Care – PPO | Source: Ambulatory Visit | Attending: Internal Medicine | Admitting: Internal Medicine

## 2020-07-04 ENCOUNTER — Other Ambulatory Visit: Payer: BC Managed Care – PPO

## 2020-07-04 ENCOUNTER — Other Ambulatory Visit: Payer: Self-pay

## 2020-07-04 DIAGNOSIS — Z113 Encounter for screening for infections with a predominantly sexual mode of transmission: Secondary | ICD-10-CM

## 2020-07-04 DIAGNOSIS — B20 Human immunodeficiency virus [HIV] disease: Secondary | ICD-10-CM

## 2020-07-05 LAB — URINE CYTOLOGY ANCILLARY ONLY
Chlamydia: NEGATIVE
Comment: NEGATIVE
Comment: NORMAL
Neisseria Gonorrhea: NEGATIVE

## 2020-07-05 LAB — T-HELPER CELL (CD4) - (RCID CLINIC ONLY)
CD4 % Helper T Cell: 26 % — ABNORMAL LOW (ref 33–65)
CD4 T Cell Abs: 872 /uL (ref 400–1790)

## 2020-07-06 LAB — HIV-1 RNA QUANT-NO REFLEX-BLD
HIV 1 RNA Quant: 40 copies/mL — ABNORMAL HIGH
HIV-1 RNA Quant, Log: 1.6 Log copies/mL — ABNORMAL HIGH

## 2020-07-06 LAB — COMPREHENSIVE METABOLIC PANEL
AG Ratio: 1.5 (calc) (ref 1.0–2.5)
ALT: 31 U/L (ref 9–46)
AST: 24 U/L (ref 10–35)
Albumin: 4.3 g/dL (ref 3.6–5.1)
Alkaline phosphatase (APISO): 79 U/L (ref 35–144)
BUN: 7 mg/dL (ref 7–25)
CO2: 28 mmol/L (ref 20–32)
Calcium: 9.5 mg/dL (ref 8.6–10.3)
Chloride: 102 mmol/L (ref 98–110)
Creat: 0.98 mg/dL (ref 0.70–1.33)
Globulin: 2.8 g/dL (calc) (ref 1.9–3.7)
Glucose, Bld: 112 mg/dL — ABNORMAL HIGH (ref 65–99)
Potassium: 4.2 mmol/L (ref 3.5–5.3)
Sodium: 140 mmol/L (ref 135–146)
Total Bilirubin: 1.3 mg/dL — ABNORMAL HIGH (ref 0.2–1.2)
Total Protein: 7.1 g/dL (ref 6.1–8.1)

## 2020-07-06 LAB — CBC WITH DIFFERENTIAL/PLATELET
Absolute Monocytes: 623 cells/uL (ref 200–950)
Basophils Absolute: 38 cells/uL (ref 0–200)
Basophils Relative: 0.5 %
Eosinophils Absolute: 203 cells/uL (ref 15–500)
Eosinophils Relative: 2.7 %
HCT: 45.5 % (ref 38.5–50.0)
Hemoglobin: 15.7 g/dL (ref 13.2–17.1)
Lymphs Abs: 3353 cells/uL (ref 850–3900)
MCH: 29.2 pg (ref 27.0–33.0)
MCHC: 34.5 g/dL (ref 32.0–36.0)
MCV: 84.6 fL (ref 80.0–100.0)
MPV: 9.8 fL (ref 7.5–12.5)
Monocytes Relative: 8.3 %
Neutro Abs: 3285 cells/uL (ref 1500–7800)
Neutrophils Relative %: 43.8 %
Platelets: 298 10*3/uL (ref 140–400)
RBC: 5.38 10*6/uL (ref 4.20–5.80)
RDW: 13 % (ref 11.0–15.0)
Total Lymphocyte: 44.7 %
WBC: 7.5 10*3/uL (ref 3.8–10.8)

## 2020-07-06 LAB — RPR: RPR Ser Ql: REACTIVE — AB

## 2020-07-06 LAB — FLUORESCENT TREPONEMAL AB(FTA)-IGG-BLD: Fluorescent Treponemal ABS: REACTIVE — AB

## 2020-07-06 LAB — RPR TITER: RPR Titer: 1:2 {titer} — ABNORMAL HIGH

## 2020-07-16 ENCOUNTER — Other Ambulatory Visit: Payer: Self-pay

## 2020-07-16 ENCOUNTER — Ambulatory Visit (INDEPENDENT_AMBULATORY_CARE_PROVIDER_SITE_OTHER): Payer: BC Managed Care – PPO | Admitting: Internal Medicine

## 2020-07-16 ENCOUNTER — Encounter: Payer: Self-pay | Admitting: Internal Medicine

## 2020-07-16 VITALS — BP 151/71 | HR 86 | Temp 98.2°F | Wt 169.0 lb

## 2020-07-16 DIAGNOSIS — Z5181 Encounter for therapeutic drug level monitoring: Secondary | ICD-10-CM

## 2020-07-16 DIAGNOSIS — B20 Human immunodeficiency virus [HIV] disease: Secondary | ICD-10-CM | POA: Diagnosis not present

## 2020-07-16 DIAGNOSIS — Z113 Encounter for screening for infections with a predominantly sexual mode of transmission: Secondary | ICD-10-CM

## 2020-07-16 NOTE — Assessment & Plan Note (Signed)
Creat wnl.  Will continue to monitor

## 2020-07-16 NOTE — Assessment & Plan Note (Signed)
Screened negative 

## 2020-07-16 NOTE — Progress Notes (Signed)
   Subjective:    Patient ID: Lars Mage II, male    DOB: [birth year], 54 y.o.   MRN: [id number]  HPI Here for follow up of HIV He continues on Biktarvy and denies any missed doses.  CD4 of 872 and viral load just 40 copies.  No new complaints.  Had his COVID vaccine.  Recently separated from his husband after marrying last November.  Creat stable.     Review of Systems  Constitutional: Negative for fatigue.  Gastrointestinal: Negative for diarrhea and nausea.  Skin: Negative for rash.       Objective:   Physical Exam Eyes:     General: No scleral icterus. Cardiovascular:     Rate and Rhythm: Normal rate and regular rhythm.     Heart sounds: No murmur heard.   Pulmonary:     Effort: Pulmonary effort is normal.  Neurological:     Mental Status: He is alert.  Psychiatric:        Mood and Affect: Mood normal.   SH: no tobacco        Assessment & Plan:

## 2020-07-16 NOTE — Assessment & Plan Note (Signed)
He continues to do well with his HIV and no changes.  rtc in 6 months

## 2020-09-07 ENCOUNTER — Ambulatory Visit: Payer: BC Managed Care – PPO | Admitting: Registered Nurse

## 2020-09-10 ENCOUNTER — Encounter: Payer: Self-pay | Admitting: Registered Nurse

## 2020-09-10 ENCOUNTER — Ambulatory Visit: Payer: BC Managed Care – PPO | Admitting: Registered Nurse

## 2020-09-10 ENCOUNTER — Other Ambulatory Visit: Payer: Self-pay

## 2020-09-10 VITALS — BP 122/76 | HR 78 | Temp 97.6°F | Resp 18 | Ht 64.5 in | Wt 165.4 lb

## 2020-09-10 DIAGNOSIS — Z13220 Encounter for screening for lipoid disorders: Secondary | ICD-10-CM

## 2020-09-10 DIAGNOSIS — K219 Gastro-esophageal reflux disease without esophagitis: Secondary | ICD-10-CM

## 2020-09-10 DIAGNOSIS — B20 Human immunodeficiency virus [HIV] disease: Secondary | ICD-10-CM

## 2020-09-10 DIAGNOSIS — G2581 Restless legs syndrome: Secondary | ICD-10-CM

## 2020-09-10 DIAGNOSIS — E039 Hypothyroidism, unspecified: Secondary | ICD-10-CM

## 2020-09-10 DIAGNOSIS — F331 Major depressive disorder, recurrent, moderate: Secondary | ICD-10-CM

## 2020-09-10 MED ORDER — PANTOPRAZOLE SODIUM 20 MG PO TBEC: 20.0000 mg | DELAYED_RELEASE_TABLET | Freq: Every day | ORAL | 3 refills | Status: DC

## 2020-09-10 MED ORDER — GABAPENTIN 300 MG PO CAPS: 300.0000 mg | ORAL_CAPSULE | Freq: Every day | ORAL | 3 refills | Status: DC

## 2020-09-10 MED ORDER — LEVOTHYROXINE SODIUM 175 MCG PO TABS: ORAL_TABLET | ORAL | 1 refills | Status: DC

## 2020-09-10 MED ORDER — FLUOXETINE HCL 40 MG PO CAPS: 40.0000 mg | ORAL_CAPSULE | Freq: Every day | ORAL | 3 refills | Status: DC

## 2020-09-10 NOTE — Patient Instructions (Signed)
° ° ° °  If you have lab work done today you will be contacted with your lab results within the next 2 weeks.  If you have not heard from us then please contact us. The fastest way to get your results is to register for My Chart. ° ° °IF you received an x-ray today, you will receive an invoice from [facility]. Please contact [facility] at [phone] with questions or concerns regarding your invoice.  ° °IF you received labwork today, you will receive an invoice from [facility]. Please contact [facility] at [phone] with questions or concerns regarding your invoice.  ° °Our billing staff will not be able to assist you with questions regarding bills from these companies. ° °You will be contacted with the lab results as soon as they are available. The fastest way to get your results is to activate your My Chart account. Instructions are located on the last page of this paperwork. If you have not heard from us regarding the results in 2 weeks, please contact this office. °  ° ° ° °

## 2020-09-10 NOTE — Progress Notes (Signed)
Established Patient Office Visit  Subjective:  Patient ID: Drew Jenkins, male    DOB: 23-Oct-1966  Age: 54 y.o. MRN: [id number]  CC:  Chief Complaint  Patient presents with  . Follow-up    3 month follow up on medical conditions. Patient has no other questions or concerns.  . Medication Refill    refill on pended medications    HPI [name] presents for 3 mo follow up on chronic conditions Feeling well and is without major concern  Hypothyroid: on synthroid po qd. No AEs. No symptoms of thyroid dysfunction  Depression/Anxiety: on fluoxetine 40mg  PO qd. Good effect. Has been on for some time. No AEs. Denies HI/SI.   GERD: on pantoprazole 40mg  PO qd. Controls GERD well. Denies melena. No symptoms of GI bleed.   Neuropathy: on gabapentin with good effect. Takes regularly  Past Medical History:  Diagnosis Date  . HIV infection (HCC)     No past surgical history on file.  Family History  Problem Relation Age of Onset  . Diabetes Mother   . Hypertension Father     Social History   Socioeconomic History  . Marital status: Unknown    Spouse name: Not on file  . Number of children: Not on file  . Years of education: Not on file  . Highest education level: Not on file  Occupational History  . Not on file  Tobacco Use  . Smoking status: Never Smoker  . Smokeless tobacco: Never Used  Substance and Sexual Activity  . Alcohol use: No  . Drug use: No  . Sexual activity: Not Currently    Comment: pt declined condoms  Other Topics Concern  . Not on file  Social History Narrative  . Not on file   Social Determinants of Health   Financial Resource Strain:   . Difficulty of Paying Living Expenses: Not on file  Food Insecurity:   . Worried About in the Last Year: Not on file  . Ran Out of Food in the Last Year: Not on file  Transportation Needs:   . Lack of Transportation (Medical): Not on file  . Lack of Transportation  (Non-Medical): Not on file  Physical Activity:   . Days of Exercise per Week: Not on file  . Minutes of Exercise per Session: Not on file  Stress:   . Feeling of Stress : Not on file  Social Connections:   . Frequency of Communication with Friends and Family: Not on file  . Frequency of Social Gatherings with Friends and Family: Not on file  . Attends Religious Services: Not on file  . Active Member of Clubs or Organizations: Not on file  . Attends Meetings: Not on file  . Marital Status: Not on file  Intimate Partner Violence:   . Fear of Current or Ex-Partner: Not on file  . Emotionally Abused: Not on file  . Physically Abused: Not on file  . Sexually Abused: Not on file    Outpatient Medications Prior to Visit  Medication Sig Dispense Refill  . Bictegravir-Emtricitab-Tenofov (BIKTARVY PO) Take 1 tablet by mouth daily.     Programme researcher, broadcasting/film/video FLUoxetine (PROZAC) 40 MG capsule Take 1 capsule (40 mg total) by mouth daily. 90 capsule 3  . gabapentin (NEURONTIN) 300 MG capsule Take 1 capsule (300 mg total) by mouth at bedtime. 90 capsule 3  . levothyroxine (SYNTHROID) 175 MCG tablet TAKE 1 TABLET BY MOUTH DAILY BEFORE BREAKFAST  90 tablet 1  . pantoprazole (PROTONIX) 20 MG tablet Take 1 tablet (20 mg total) by mouth daily. 90 tablet 3   No facility-administered medications prior to visit.    Allergies  Allergen Reactions  . Oxycodone Itching    ROS Review of Systems  Constitutional: Negative.   HENT: Negative.   Eyes: Negative.   Respiratory: Negative.   Cardiovascular: Negative.   Gastrointestinal: Negative.   Genitourinary: Negative.   Musculoskeletal: Negative.   Skin: Negative.   Neurological: Negative.   Psychiatric/Behavioral: Negative.       Objective:    Physical Exam Constitutional:      General: He is not in acute distress.    Appearance: Normal appearance. He is normal weight. He is not ill-appearing, toxic-appearing or diaphoretic.    Cardiovascular:     Rate and Rhythm: Normal rate and regular rhythm.     Heart sounds: Normal heart sounds. No murmur heard.  No friction rub. No gallop.   Pulmonary:     Effort: Pulmonary effort is normal. No respiratory distress.     Breath sounds: Normal breath sounds. No stridor. No wheezing, rhonchi or rales.  Chest:     Chest wall: No tenderness.  Neurological:     General: No focal deficit present.     Mental Status: He is alert and oriented to person, place, and time. Mental status is at baseline.  Psychiatric:        Mood and Affect: Mood normal.        Behavior: Behavior normal.        Thought Content: Thought content normal.        Judgment: Judgment normal.     BP 122/76   Pulse 78   Temp 97.6 F (36.4 C) (Temporal)   Resp 18   Ht 5' 4.5" (1.638 m)   Wt 165 lb 6.4 oz (75 kg)   SpO2 97%   BMI 27.95 kg/m  Wt Readings from Last 3 Encounters:  09/10/20 165 lb 6.4 oz (75 kg)  07/16/20 169 lb (76.7 kg)  06/08/20 171 lb 12.8 oz (77.9 kg)     There are no preventive care reminders to display for this patient.  There are no preventive care reminders to display for this patient.  Lab Results  Component Value Date   TSH 2.170 06/08/2020   Lab Results  Component Value Date   WBC 7.5 07/04/2020   HGB 15.7 07/04/2020   HCT 45.5 07/04/2020   MCV 84.6 07/04/2020   PLT 298 07/04/2020   Lab Results  Component Value Date   NA 140 07/04/2020   K 4.2 07/04/2020   CO2 28 07/04/2020   GLUCOSE 112 (H) 07/04/2020   BUN 7 07/04/2020   CREATININE 0.98 07/04/2020   BILITOT 1.3 (H) 07/04/2020   ALKPHOS 93 06/08/2020   AST 24 07/04/2020   ALT 31 07/04/2020   PROT 7.1 07/04/2020   ALBUMIN 4.2 06/08/2020   CALCIUM 9.5 07/04/2020   ANIONGAP 7 01/19/2020   Lab Results  Component Value Date   CHOL 177 03/12/2020   Lab Results  Component Value Date   HDL 45 03/12/2020   Lab Results  Component Value Date   LDLCALC 114 (H) 03/12/2020   Lab Results  Component  Value Date   TRIG 99 03/12/2020   Lab Results  Component Value Date   CHOLHDL 3.9 03/12/2020   Lab Results  Component Value Date   HGBA1C 4.7 04/21/2017      Assessment &  Plan:   Problem List Items Addressed This Visit      Digestive   GERD (gastroesophageal reflux disease)   Relevant Medications   pantoprazole (PROTONIX) 20 MG tablet   Other Relevant Orders   CBC     Endocrine   Hypothyroidism, adult   Relevant Medications   levothyroxine (SYNTHROID) 175 MCG tablet   Other Relevant Orders   Thyroid Panel With TSH     Other   Depression   Relevant Medications   FLUoxetine (PROZAC) 40 MG capsule    Other Visit Diagnoses    Screening for cholesterol level    -  Primary   Relevant Orders   Lipid Panel   RLS (restless legs syndrome)       Relevant Medications   gabapentin (NEURONTIN) 300 MG capsule   History of HIV infection (HCC)       Relevant Orders   Comprehensive metabolic panel      Meds ordered this encounter  Medications  . FLUoxetine (PROZAC) 40 MG capsule    Sig: Take 1 capsule (40 mg total) by mouth daily.    Dispense:  90 capsule    Refill:  3  . gabapentin (NEURONTIN) 300 MG capsule    Sig: Take 1 capsule (300 mg total) by mouth at bedtime.    Dispense:  90 capsule    Refill:  3  . levothyroxine (SYNTHROID) 175 MCG tablet    Sig: TAKE 1 TABLET BY MOUTH DAILY BEFORE BREAKFAST    Dispense:  90 tablet    Refill:  1  . pantoprazole (PROTONIX) 20 MG tablet    Sig: Take 1 tablet (20 mg total) by mouth daily.    Dispense:  90 tablet    Refill:  3    Follow-up: No follow-ups on file.   PLAN  Labs drawn, will follow up as warranted  Return in 6 mo for med check  Refills sent  Patient encouraged to call clinic with any questions, comments, or concerns.  Janeece Agee, NP

## 2020-09-11 LAB — COMPREHENSIVE METABOLIC PANEL WITH GFR
ALT: 28 IU/L (ref 0–44)
AST: 29 IU/L (ref 0–40)
Albumin/Globulin Ratio: 1.5 (ref 1.2–2.2)
Albumin: 4.4 g/dL (ref 3.8–4.9)
Alkaline Phosphatase: 83 IU/L (ref 44–121)
BUN/Creatinine Ratio: 8 — ABNORMAL LOW (ref 9–20)
BUN: 8 mg/dL (ref 6–24)
Bilirubin Total: 1.3 mg/dL — ABNORMAL HIGH (ref 0.0–1.2)
CO2: 18 mmol/L — ABNORMAL LOW (ref 20–29)
Calcium: 9.4 mg/dL (ref 8.7–10.2)
Chloride: 106 mmol/L (ref 96–106)
Creatinine, Ser: 0.99 mg/dL (ref 0.76–1.27)
GFR calc Af Amer: 98 mL/min/1.73
GFR calc non Af Amer: 85 mL/min/1.73
Globulin, Total: 2.9 g/dL (ref 1.5–4.5)
Glucose: 105 mg/dL — ABNORMAL HIGH (ref 65–99)
Potassium: 4.2 mmol/L (ref 3.5–5.2)
Sodium: 140 mmol/L (ref 134–144)
Total Protein: 7.3 g/dL (ref 6.0–8.5)

## 2020-09-11 LAB — LIPID PANEL
Chol/HDL Ratio: 3.9 ratio (ref 0.0–5.0)
Cholesterol, Total: 171 mg/dL (ref 100–199)
HDL: 44 mg/dL (ref >39)
LDL Chol Calc (NIH): 109 mg/dL — ABNORMAL HIGH (ref 0–99)
Triglycerides: 98 mg/dL (ref 0–149)
VLDL Cholesterol Cal: 18 mg/dL (ref 5–40)

## 2020-09-11 LAB — CBC
Hematocrit: 46.1 % (ref 37.5–51.0)
Hemoglobin: 15.9 g/dL (ref 13.0–17.7)
MCH: 29.3 pg (ref 26.6–33.0)
MCHC: 34.5 g/dL (ref 31.5–35.7)
MCV: 85 fL (ref 79–97)
Platelets: 297 x10E3/uL (ref 150–450)
RBC: 5.42 x10E6/uL (ref 4.14–5.80)
RDW: 12.8 % (ref 11.6–15.4)
WBC: 8.1 x10E3/uL (ref 3.4–10.8)

## 2020-09-11 LAB — THYROID PANEL WITH TSH
Free Thyroxine Index: 2.6 (ref 1.2–4.9)
T3 Uptake Ratio: 26 % (ref 24–39)
T4, Total: 10.1 ug/dL (ref 4.5–12.0)
TSH: 2.6 u[IU]/mL (ref 0.450–4.500)

## 2020-12-24 ENCOUNTER — Encounter: Payer: Self-pay | Admitting: Registered Nurse

## 2020-12-25 ENCOUNTER — Encounter: Payer: Self-pay | Admitting: Registered Nurse

## 2021-01-08 ENCOUNTER — Telehealth (INDEPENDENT_AMBULATORY_CARE_PROVIDER_SITE_OTHER): Payer: BC Managed Care – PPO | Admitting: Family Medicine

## 2021-01-08 ENCOUNTER — Encounter: Payer: Self-pay | Admitting: Family Medicine

## 2021-01-08 ENCOUNTER — Telehealth: Payer: Self-pay | Admitting: Registered Nurse

## 2021-01-08 DIAGNOSIS — G2581 Restless legs syndrome: Secondary | ICD-10-CM | POA: Diagnosis not present

## 2021-01-08 DIAGNOSIS — R42 Dizziness and giddiness: Secondary | ICD-10-CM

## 2021-01-08 MED ORDER — MECLIZINE HCL 25 MG PO TABS: 25.0000 mg | ORAL_TABLET | Freq: Three times a day (TID) | ORAL | 1 refills | Status: DC | PRN

## 2021-01-08 MED ORDER — GABAPENTIN 400 MG PO CAPS: 400.0000 mg | ORAL_CAPSULE | Freq: Every day | ORAL | 3 refills | Status: DC

## 2021-01-08 MED ORDER — GABAPENTIN 300 MG PO CAPS: 300.0000 mg | ORAL_CAPSULE | Freq: Every day | ORAL | 3 refills | Status: DC

## 2021-01-08 NOTE — Progress Notes (Signed)
Virtual Visit Note  I connected with patient on 01/08/21 at 920 by telephone due to unable to work Epic video visit and verified that I am speaking with the correct person using two identifiers. Drew Jenkins is currently located at home and no family members are currently with them during visit. The provider, Azalee Course Tarik Teixeira, FNP is located in their office at time of visit.  I discussed the limitations, risks, security and privacy concerns of performing an evaluation and management service by telephone and the availability of in person appointments. I also discussed with the patient that there may be a patient responsible charge related to this service. The patient expressed understanding and agreed to proceed.   I provided 20 minutes of non-face-to-face time during this encounter.  Chief Complaint  Patient presents with  . 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 Felt dizzy this morning when he went out to his vehicle . Pt has also been working on home flooring this weekend     HPI ? 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 Oxycodone Itching    Prior to Admission medications   Medication Sig Start Date End Date Taking? Authorizing Provider  Bictegravir-Emtricitab-Tenofov (BIKTARVY PO) Take 1 tablet by mouth daily.    Yes [provider]  FLUoxetine (PROZAC) 40 MG capsule Take 1 capsule (40 mg total) by mouth daily. 09/10/20  Yes Janeece Agee, NP  gabapentin (NEURONTIN) 300 MG capsule Take 1 capsule (300 mg total) by mouth at bedtime. 09/10/20  Yes Janeece Agee, NP  levothyroxine (SYNTHROID) 175 MCG tablet TAKE 1 TABLET BY MOUTH DAILY BEFORE BREAKFAST 09/10/20  Yes Janeece Agee, NP  Multiple Vitamin (MULTIVITAMIN) tablet Take 1 tablet by mouth daily.   Yes [provider]  pantoprazole (PROTONIX) 20 MG tablet Take 1 tablet (20 mg total) by mouth daily. 09/10/20  Yes Janeece Agee, NP  vitamin B-12 (CYANOCOBALAMIN) 1000 MCG tablet Take 1,000 mcg by mouth daily.   Yes [provider]  Vitamin D, Ergocalciferol, (DRISDOL) 1.25 MG (50000 UNIT) CAPS capsule Take 50,000 Units by mouth every 7 (seven) days.   Yes [provider]  albuterol (VENTOLIN HFA) 108 (90 Base) MCG/ACT inhaler INHALE 2 PUFFS BY MOUTH INTO THE LUNGS EVERY 4 HOURS AS NEEDED FOR WHEEZING OR SHORTNESS OF BREATH  OR COUGH Patient not taking: No sig reported 12/05/19 01/20/20  Peyton Najjar, MD    Past Medical History:  Diagnosis Date  . HIV infection (HCC)     History reviewed. No pertinent surgical history.  Social History   Tobacco Use  . Smoking status: Never Smoker  . Smokeless tobacco: Never Used  Substance Use Topics  . Alcohol use: No    Family History  Problem Relation Age of Onset  . Diabetes Mother   . Hypertension Father     Review of Systems  Constitutional: Positive for malaise/fatigue. Negative for chills and fever.  HENT: Negative for congestion, ear pain, sinus pain, sore throat  and tinnitus.   Eyes: Negative for blurred vision and double vision.  Respiratory: Negative for cough, shortness of breath and wheezing.   Cardiovascular: Negative for chest pain, palpitations and leg swelling.  Gastrointestinal: Negative for abdominal pain, blood in stool, constipation, diarrhea, heartburn, nausea and vomiting.  Genitourinary: Negative for dysuria, frequency and hematuria.  Musculoskeletal: Negative for back pain and joint pain.  Skin: Negative for rash.  Neurological: Positive for dizziness, sensory change and weakness. Negative for headaches.    Objective  Constitutional:      General: Not in acute distress.    Appearance: Normal appearance. Not ill-appearing.   Pulmonary:     Effort: Pulmonary effort is normal. No respiratory distress.  Neurological:     Mental Status: Alert and oriented to person, place, and time.  Psychiatric:        Mood and Affect: Mood normal.        Behavior: Behavior normal.      ASSESSMENT and PLAN  Problem List Items Addressed This Visit   None   Visit Diagnoses    Dizziness    -  Primary   Relevant Medications   meclizine (ANTIVERT) 25 MG tablet   Other Relevant Orders   CT Head Wo Contrast   RLS (restless legs syndrome)       Relevant Medications   gabapentin (NEURONTIN) 400 MG capsule      Plan   Will call to schedule CT to rule out stroke  Patient declines going to ED at this time to rule out stroke  Increase fluids as able  Work note provided  Take meclizine as needed for dizziness, discussed r/se/b  Increased gabapentin dose for RLS  RTC/ED precautions provided  Return if symptoms worsen or fail to improve, for next scheduled appointment.    The above assessment and management plan was discussed with the patient. The patient verbalized understanding of and has agreed to the management plan. Patient is aware to call the clinic if symptoms persist or worsen. Patient is aware when to return to the clinic  for a follow-up visit. Patient educated on when it is appropriate to go to the emergency department.     Macario Carls Virda Betters, FNP-BC Primary Care at [facility] [address] [place], [place] [address] Ph.  [phone] Fax [phone]

## 2021-01-08 NOTE — Patient Instructions (Addendum)
 Dizziness Dizziness is a common problem. It makes you feel unsteady or light-headed. You may feel like you are about to pass out (faint). Dizziness can lead to getting hurt if you stumble or fall. Dizziness can be caused by many things, including:  Medicines.  Not having enough water in your body (dehydration).  Illness. Follow these instructions at home: Eating and drinking  Drink enough fluid to keep your pee (urine) clear or pale yellow. This helps to keep you from getting dehydrated. Try to drink more clear fluids, such as water.  Do not drink alcohol.  Limit how much caffeine you drink or eat, if your doctor tells you to do that.  Limit how much salt (sodium) you drink or eat, if your doctor tells you to do that.   Activity  Avoid making quick movements. ? When you stand up from sitting in a chair, steady yourself until you feel okay. ? In the morning, first sit up on the side of the bed. When you feel okay, stand slowly while you hold onto something. Do this until you know that your balance is fine.  If you need to stand in one place for a long time, move your legs often. Tighten and relax the muscles in your legs while you are standing.  Do not drive or use heavy machinery if you feel dizzy.  Avoid bending down if you feel dizzy. Place items in your home so you can reach them easily without leaning over.   Lifestyle  Do not use any products that contain nicotine or tobacco, such as cigarettes and e-cigarettes. If you need help quitting, ask your doctor.  Try to lower your stress level. You can do this by using methods such as yoga or meditation. Talk with your doctor if you need help. General instructions  Watch your dizziness for any changes.  Take over-the-counter and prescription medicines only as told by your doctor. Talk with your doctor if you think that you are dizzy because of a medicine that you are taking.  Tell a friend or a family member that you are  feeling dizzy. If he or she notices any changes in your behavior, have this person call your doctor.  Keep all follow-up visits as told by your doctor. This is important. Contact a doctor if:  Your dizziness does not go away.  Your dizziness or light-headedness gets worse.  You feel sick to your stomach (nauseous).  You have trouble hearing.  You have new symptoms.  You are unsteady on your feet.  You feel like the room is spinning. Get help right away if:  You throw up (vomit) or have watery poop (diarrhea), and you cannot eat or drink anything.  You have trouble: ? Talking. ? Walking. ? Swallowing. ? Using your arms, hands, or legs.  You feel generally weak.  You are not thinking clearly, or you have trouble forming sentences. A friend or family member may notice this.  You have: ? Chest pain. ? Pain in your belly (abdomen). ? Shortness of breath. ? Sweating.  Your vision changes.  You are bleeding.  You have a very bad headache.  You have neck pain or a stiff neck.  You have a fever. These symptoms may be an emergency. Do not wait to see if the symptoms will go away. Get medical help right away. Call your local emergency services (911 in the [place] Do not drive yourself to the hospital. Summary  Dizziness makes you feel unsteady   or light-headed. You may feel like you are about to pass out (faint).  Drink enough fluid to keep your pee (urine) clear or pale yellow. Do not drink alcohol.  Avoid making quick movements if you feel dizzy.  Watch your dizziness for any changes. This information is not intended to replace advice given to you by your health care provider. Make sure you discuss any questions you have with your health care provider. Document Revised: 08/22/2020 Document Reviewed: 12/18/2016 Elsevier Patient Education  2021 Elsevier Inc.   If you have lab work done today you will be contacted with your lab results within the next 2 weeks.  If you  have not heard from us then please contact us. The fastest way to get your results is to register for My Chart.   IF you received an x-ray today, you will receive an invoice from [facility]. Please contact [facility] at [phone] with questions or concerns regarding your invoice.   IF you received labwork today, you will receive an invoice from [facility]. Please contact [facility] at [phone] with questions or concerns regarding your invoice.   Our billing staff will not be able to assist you with questions regarding bills from these companies.  You will be contacted with the lab results as soon as they are available. The fastest way to get your results is to activate your My Chart account. Instructions are located on the last page of this paperwork. If you have not heard from us regarding the results in 2 weeks, please contact this office.      

## 2021-01-09 ENCOUNTER — Telehealth: Payer: Self-pay | Admitting: Registered Nurse

## 2021-01-09 NOTE — Telephone Encounter (Signed)
Patients pharmacy calling to verify Gabapentin dosage confirmed  With pharmacy per clinical that Rx Should be  gabapentin (NEURONTIN) 400 MG capsule

## 2021-01-16 ENCOUNTER — Other Ambulatory Visit: Payer: BC Managed Care – PPO

## 2021-01-18 ENCOUNTER — Other Ambulatory Visit: Payer: BC Managed Care – PPO

## 2021-01-31 ENCOUNTER — Encounter: Payer: BC Managed Care – PPO | Admitting: Internal Medicine

## 2021-01-31 ENCOUNTER — Other Ambulatory Visit: Payer: BC Managed Care – PPO

## 2021-01-31 ENCOUNTER — Other Ambulatory Visit: Payer: Self-pay

## 2021-01-31 DIAGNOSIS — B20 Human immunodeficiency virus [HIV] disease: Secondary | ICD-10-CM

## 2021-02-01 LAB — T-HELPER CELL (CD4) - (RCID CLINIC ONLY)
CD4 % Helper T Cell: 25 % — ABNORMAL LOW (ref 33–65)
CD4 T Cell Abs: 896 /uL (ref 400–1790)

## 2021-02-03 LAB — HIV-1 RNA QUANT-NO REFLEX-BLD
HIV 1 RNA Quant: <20 Copies/mL
HIV-1 RNA Quant, Log: <1.3 Log cps/mL

## 2021-02-22 ENCOUNTER — Ambulatory Visit: Payer: BC Managed Care – PPO | Admitting: Internal Medicine

## 2021-02-22 ENCOUNTER — Other Ambulatory Visit: Payer: Self-pay

## 2021-02-22 ENCOUNTER — Encounter: Payer: Self-pay | Admitting: Internal Medicine

## 2021-02-22 VITALS — BP 133/80 | HR 70 | Temp 98.1°F | Ht 64.0 in | Wt 170.0 lb

## 2021-02-22 DIAGNOSIS — Z79899 Other long term (current) drug therapy: Secondary | ICD-10-CM

## 2021-02-22 DIAGNOSIS — Z113 Encounter for screening for infections with a predominantly sexual mode of transmission: Secondary | ICD-10-CM

## 2021-02-22 DIAGNOSIS — B20 Human immunodeficiency virus [HIV] disease: Secondary | ICD-10-CM

## 2021-02-22 DIAGNOSIS — Z21 Asymptomatic human immunodeficiency virus [HIV] infection status: Secondary | ICD-10-CM

## 2021-02-22 DIAGNOSIS — Z23 Encounter for immunization: Secondary | ICD-10-CM

## 2021-02-22 MED ORDER — BICTEGRAVIR-EMTRICITAB-TENOFOV 50-200-25 MG PO TABS: 1.0000 | ORAL_TABLET | Freq: Every day | ORAL | 11 refills | Status: DC

## 2021-02-22 NOTE — Assessment & Plan Note (Signed)
He continues to do well on Biktarvy and no changes.  He will rtc in 6 months.

## 2021-02-22 NOTE — Assessment & Plan Note (Signed)
I discussed pneumovax 23 and given today.  No record of this previously though presumably had it at his prior clinic.

## 2021-02-22 NOTE — Progress Notes (Signed)
   Subjective:    Patient ID: Drew Jenkins, male    DOB: [birth year], 55 y.o.   MRN: [id number]  HPI Here for follow up of HIV He continues on Biktarvy and denies any missed doses.  CD4 remains good at 896 and viral load < 20.  Denies any recent sexual activity.     Review of Systems  Constitutional: Negative for fatigue.  Gastrointestinal: Negative for diarrhea and nausea.  Skin: Negative for rash.       Objective:   Physical Exam Eyes:     General: No scleral icterus. Cardiovascular:     Rate and Rhythm: Normal rate and regular rhythm.  Pulmonary:     Effort: Pulmonary effort is normal.  Neurological:     General: No focal deficit present.     Mental Status: He is alert.  Psychiatric:        Behavior: Behavior normal.           Assessment & Plan:

## 2021-03-08 ENCOUNTER — Other Ambulatory Visit: Payer: Self-pay

## 2021-03-08 ENCOUNTER — Ambulatory Visit: Payer: BC Managed Care – PPO | Admitting: Registered Nurse

## 2021-03-08 ENCOUNTER — Encounter: Payer: Self-pay | Admitting: Registered Nurse

## 2021-03-08 VITALS — BP 126/78 | HR 66 | Temp 98.0°F | Resp 18 | Ht 64.0 in | Wt 169.6 lb

## 2021-03-08 DIAGNOSIS — Z8261 Family history of arthritis: Secondary | ICD-10-CM

## 2021-03-08 DIAGNOSIS — M5441 Lumbago with sciatica, right side: Secondary | ICD-10-CM

## 2021-03-08 DIAGNOSIS — M5442 Lumbago with sciatica, left side: Secondary | ICD-10-CM

## 2021-03-08 DIAGNOSIS — M79641 Pain in right hand: Secondary | ICD-10-CM

## 2021-03-08 DIAGNOSIS — R5382 Chronic fatigue, unspecified: Secondary | ICD-10-CM | POA: Diagnosis not present

## 2021-03-08 DIAGNOSIS — K219 Gastro-esophageal reflux disease without esophagitis: Secondary | ICD-10-CM

## 2021-03-08 DIAGNOSIS — M79642 Pain in left hand: Secondary | ICD-10-CM

## 2021-03-08 DIAGNOSIS — E039 Hypothyroidism, unspecified: Secondary | ICD-10-CM | POA: Diagnosis not present

## 2021-03-08 DIAGNOSIS — G8929 Other chronic pain: Secondary | ICD-10-CM

## 2021-03-08 DIAGNOSIS — G2581 Restless legs syndrome: Secondary | ICD-10-CM

## 2021-03-08 DIAGNOSIS — F331 Major depressive disorder, recurrent, moderate: Secondary | ICD-10-CM

## 2021-03-08 DIAGNOSIS — R42 Dizziness and giddiness: Secondary | ICD-10-CM

## 2021-03-08 MED ORDER — PANTOPRAZOLE SODIUM 20 MG PO TBEC: 20.0000 mg | DELAYED_RELEASE_TABLET | Freq: Every day | ORAL | 3 refills | Status: DC

## 2021-03-08 MED ORDER — FLUOXETINE HCL 40 MG PO CAPS: 40.0000 mg | ORAL_CAPSULE | Freq: Every day | ORAL | 3 refills | Status: DC

## 2021-03-08 MED ORDER — MECLIZINE HCL 25 MG PO TABS: 25.0000 mg | ORAL_TABLET | Freq: Three times a day (TID) | ORAL | 1 refills | Status: DC | PRN

## 2021-03-08 MED ORDER — CYCLOBENZAPRINE HCL 5 MG PO TABS: 5.0000 mg | ORAL_TABLET | Freq: Every evening | ORAL | 1 refills | Status: DC | PRN

## 2021-03-08 MED ORDER — GABAPENTIN 400 MG PO CAPS: 400.0000 mg | ORAL_CAPSULE | Freq: Every day | ORAL | 3 refills | Status: DC

## 2021-03-08 NOTE — Patient Instructions (Signed)
° ° ° °  If you have lab work done today you will be contacted with your lab results within the next 2 weeks.  If you have not heard from us then please contact us. The fastest way to get your results is to register for My Chart. ° ° °IF you received an x-ray today, you will receive an invoice from [facility]. Please contact [facility] at [phone] with questions or concerns regarding your invoice.  ° °IF you received labwork today, you will receive an invoice from [facility]. Please contact [facility] at [phone] with questions or concerns regarding your invoice.  ° °Our billing staff will not be able to assist you with questions regarding bills from these companies. ° °You will be contacted with the lab results as soon as they are available. The fastest way to get your results is to activate your My Chart account. Instructions are located on the last page of this paperwork. If you have not heard from us regarding the results in 2 weeks, please contact this office. °  ° ° ° °

## 2021-03-09 LAB — ANA W/REFLEX: Anti Nuclear Antibody (ANA): NEGATIVE

## 2021-03-09 LAB — CBC WITH DIFFERENTIAL
Basophils Absolute: 0 10*3/uL (ref 0.0–0.2)
Basos: 0 %
EOS (ABSOLUTE): 0.2 10*3/uL (ref 0.0–0.4)
Eos: 3 %
Hematocrit: 45.7 % (ref 37.5–51.0)
Hemoglobin: 16 g/dL (ref 13.0–17.7)
Immature Grans (Abs): 0 10*3/uL (ref 0.0–0.1)
Immature Granulocytes: 0 %
Lymphocytes Absolute: 4.7 10*3/uL — ABNORMAL HIGH (ref 0.7–3.1)
Lymphs: 51 %
MCH: 29.9 pg (ref 26.6–33.0)
MCHC: 35 g/dL (ref 31.5–35.7)
MCV: 85 fL (ref 79–97)
Monocytes Absolute: 0.8 10*3/uL (ref 0.1–0.9)
Monocytes: 9 %
Neutrophils Absolute: 3.4 10*3/uL (ref 1.4–7.0)
Neutrophils: 37 %
RBC: 5.35 x10E6/uL (ref 4.14–5.80)
RDW: 12.2 % (ref 11.6–15.4)
WBC: 9.2 10*3/uL (ref 3.4–10.8)

## 2021-03-09 LAB — COMPREHENSIVE METABOLIC PANEL
ALT: 26 IU/L (ref 0–44)
AST: 21 IU/L (ref 0–40)
Albumin/Globulin Ratio: 1.4 (ref 1.2–2.2)
Albumin: 4.3 g/dL (ref 3.8–4.9)
Alkaline Phosphatase: 73 IU/L (ref 44–121)
BUN/Creatinine Ratio: 9 (ref 9–20)
BUN: 10 mg/dL (ref 6–24)
Bilirubin Total: 1.7 mg/dL — ABNORMAL HIGH (ref 0.0–1.2)
CO2: 22 mmol/L (ref 20–29)
Calcium: 9.4 mg/dL (ref 8.7–10.2)
Chloride: 101 mmol/L (ref 96–106)
Creatinine, Ser: 1.13 mg/dL (ref 0.76–1.27)
Globulin, Total: 3.1 g/dL (ref 1.5–4.5)
Glucose: 93 mg/dL (ref 65–99)
Potassium: 4.1 mmol/L (ref 3.5–5.2)
Sodium: 140 mmol/L (ref 134–144)
Total Protein: 7.4 g/dL (ref 6.0–8.5)
eGFR: 76 mL/min/{1.73_m2} (ref >59)

## 2021-03-09 LAB — THYROID PANEL WITH TSH
Free Thyroxine Index: 2.6 (ref 1.2–4.9)
T3 Uptake Ratio: 27 % (ref 24–39)
T4, Total: 9.5 ug/dL (ref 4.5–12.0)
TSH: 3.35 u[IU]/mL (ref 0.450–4.500)

## 2021-03-09 LAB — RHEUMATOID FACTOR: Rheumatoid fact SerPl-aCnc: <10 IU/mL (ref <14.0)

## 2021-03-09 LAB — VITAMIN B12: Vitamin B-12: 1262 pg/mL — ABNORMAL HIGH (ref 232–1245)

## 2021-03-09 LAB — VITAMIN D 25 HYDROXY (VIT D DEFICIENCY, FRACTURES): Vit D, 25-Hydroxy: 79.1 ng/mL (ref 30.0–100.0)

## 2021-03-11 ENCOUNTER — Encounter: Payer: Self-pay | Admitting: Registered Nurse

## 2021-03-11 ENCOUNTER — Ambulatory Visit: Payer: Self-pay | Admitting: Registered Nurse

## 2021-03-12 NOTE — Telephone Encounter (Signed)
FYI

## 2021-05-03 ENCOUNTER — Other Ambulatory Visit: Payer: Self-pay | Admitting: Registered Nurse

## 2021-05-03 DIAGNOSIS — G8929 Other chronic pain: Secondary | ICD-10-CM

## 2021-05-03 DIAGNOSIS — M5442 Lumbago with sciatica, left side: Secondary | ICD-10-CM

## 2021-05-03 NOTE — Telephone Encounter (Signed)
LFD 03/08/21 #30 with 1 refill LOV 03/08/21 NOV none

## 2021-06-06 ENCOUNTER — Encounter: Payer: Self-pay | Admitting: Registered Nurse

## 2021-06-07 ENCOUNTER — Ambulatory Visit: Payer: BC Managed Care – PPO | Admitting: Registered Nurse

## 2021-06-07 ENCOUNTER — Encounter: Payer: Self-pay | Admitting: Registered Nurse

## 2021-06-07 ENCOUNTER — Other Ambulatory Visit: Payer: Self-pay

## 2021-06-07 VITALS — BP 136/74 | HR 65 | Temp 98.1°F | Resp 16 | Ht 64.0 in | Wt 174.0 lb

## 2021-06-07 DIAGNOSIS — M542 Cervicalgia: Secondary | ICD-10-CM | POA: Diagnosis not present

## 2021-06-07 DIAGNOSIS — Z23 Encounter for immunization: Secondary | ICD-10-CM

## 2021-06-07 MED ORDER — METHYLPREDNISOLONE ACETATE 80 MG/ML IJ SUSP
80.0000 mg | Freq: Once | INTRAMUSCULAR | Status: AC
  Administered 2021-06-07: 80 mg via INTRAMUSCULAR

## 2021-06-07 MED ORDER — METAXALONE 800 MG PO TABS
400.0000 mg | ORAL_TABLET | Freq: Three times a day (TID) | ORAL | 0 refills | Status: DC | PRN
Start: 2021-06-07 — End: 2021-06-28

## 2021-06-07 NOTE — Patient Instructions (Signed)
Drew Jenkins -   Always a pleasure to see you.  In brief: Neck pain Depo medrol injection today : antiinflammatory, pain relief Skelaxin 400-800mg  by mouth up to three times daily as needed - muscle relaxer Xray of neck - at [address] - can call [phone] Shingles shot: First one given today - next one due between 2 and 6 mo from now Covid Vaccine: Get booster 3 mo after infection, no sooner than 08/28/21  Thank you,  I'll be in touch    Rich

## 2021-06-07 NOTE — Progress Notes (Signed)
Acute Office Visit  Subjective:    Patient ID: Drew Jenkins, male    DOB: 01-20-1966, 55 y.o.   MRN: [id number]  Chief Complaint  Patient presents with   Neck Pain    Pt reports pain in neck feeling tingling in his RT as well as weakness.    Immunizations    Discuss having shingles shot today or wait     HPI Patient is in today for neck pain  Drives professionally,  Pain in neck, R side, feels R arm is heavy and weak Difficult to hold coffee cup Paresthesias in R hand No acute injury.   Questions on shingles vaccine safety and efficacy Had chicken pox as child, had shingles in 1994. No previous shingles vaccination  COVID - tested positive on 05/28/21. Symptoms resolved Relatively mild course Due for booster #4 - Curious on timing  Past Medical History:  Diagnosis Date   HIV infection ([place] [facility])     No past surgical history on file.  Family History  Problem Relation Age of Onset   Diabetes Mother    Arthritis/Rheumatoid Mother    Hypertension Father     Social History   Socioeconomic History   Marital status: Unknown    Spouse name: Not on file   Number of children: Not on file   Years of education: Not on file   Highest education level: Not on file  Occupational History   Not on file  Tobacco Use   Smoking status: Never   Smokeless tobacco: Never  Substance and Sexual Activity   Alcohol use: No   Drug use: No   Sexual activity: Not Currently    Comment: pt declined condoms  Other Topics Concern   Not on file  Social History Narrative   Not on file   Social Determinants of Health   Financial Resource Strain: Not on file  Food Insecurity: Not on file  Transportation Needs: Not on file  Physical Activity: Not on file  Stress: Not on file  Social Connections: Not on file  Intimate Partner Violence: Not on file    Outpatient Medications Prior to Visit  Medication Sig Dispense Refill   albuterol (VENTOLIN HFA) 108 (90 Base) MCG/ACT inhaler  SMARTSIG:2 Puff(s) By Mouth Every 4 Hours PRN     bictegravir-emtricitabine-tenofovir AF (BIKTARVY) 50-200-25 MG TABS tablet Take 1 tablet by mouth daily. 30 tablet 11   cyclobenzaprine (FLEXERIL) 5 MG tablet TAKE 1 TABLET (5 MG TOTAL) BY MOUTH AT BEDTIME AS NEEDED FOR MUSCLE SPASMS (LOWER BACK PAIN). 30 tablet 1   FLUoxetine (PROZAC) 40 MG capsule Take 1 capsule (40 mg total) by mouth daily. 90 capsule 3   gabapentin (NEURONTIN) 400 MG capsule Take 1 capsule (400 mg total) by mouth at bedtime. 60 capsule 3   levothyroxine (SYNTHROID) 175 MCG tablet TAKE 1 TABLET BY MOUTH DAILY BEFORE BREAKFAST 90 tablet 1   meclizine (ANTIVERT) 25 MG tablet Take 1 tablet (25 mg total) by mouth 3 (three) times daily as needed for dizziness. 30 tablet 1   Multiple Vitamin (MULTIVITAMIN) tablet Take 1 tablet by mouth daily.     pantoprazole (PROTONIX) 20 MG tablet Take 1 tablet (20 mg total) by mouth daily. 90 tablet 3   vitamin B-12 (CYANOCOBALAMIN) 1000 MCG tablet Take 1,000 mcg by mouth daily.     VITAMIN D PO Take by mouth daily.     No facility-administered medications prior to visit.    Allergies  Allergen Reactions   Oxycodone  Itching    Review of Systems  Constitutional: Negative.   HENT: Negative.    Eyes: Negative.   Respiratory: Negative.    Cardiovascular: Negative.   Gastrointestinal: Negative.   Genitourinary: Negative.   Musculoskeletal: Negative.   Skin: Negative.   Neurological: Negative.   Psychiatric/Behavioral: Negative.    All other systems reviewed and are negative.     Objective:    Physical Exam Vitals and nursing note reviewed.  Constitutional:      Appearance: Normal appearance.  Cardiovascular:     Rate and Rhythm: Normal rate and regular rhythm.     Pulses: Normal pulses.     Heart sounds: Normal heart sounds. No murmur heard.   No friction rub. No gallop.  Pulmonary:     Effort: Pulmonary effort is normal. No respiratory distress.     Breath sounds: Normal  breath sounds. No stridor. No wheezing, rhonchi or rales.  Musculoskeletal:        General: Tenderness (c spine and R trapezius) present. No swelling, deformity or signs of injury. Normal range of motion.     Right lower leg: No edema.     Left lower leg: No edema.  Neurological:     General: No focal deficit present.     Mental Status: He is alert. Mental status is at baseline.  Psychiatric:        Mood and Affect: Mood normal.        Behavior: Behavior normal.        Thought Content: Thought content normal.        Judgment: Judgment normal.    BP 136/74   Pulse 65   Temp 98.1 F (36.7 C) (Temporal)   Resp 16   Ht '5\' 4"'  (1.626 m)   Wt 174 lb (78.9 kg)   SpO2 98%   BMI 29.87 kg/m  Wt Readings from Last 3 Encounters:  06/07/21 174 lb (78.9 kg)  03/08/21 169 lb 9.6 oz (76.9 kg)  02/22/21 170 lb (77.1 kg)    There are no preventive care reminders to display for this patient.  There are no preventive care reminders to display for this patient.   Lab Results  Component Value Date   TSH 3.350 03/08/2021   Lab Results  Component Value Date   WBC 9.2 03/08/2021   HGB 16.0 03/08/2021   HCT 45.7 03/08/2021   MCV 85 03/08/2021   PLT 297 09/10/2020   Lab Results  Component Value Date   NA 140 03/08/2021   K 4.1 03/08/2021   CO2 22 03/08/2021   GLUCOSE 93 03/08/2021   BUN 10 03/08/2021   CREATININE 1.13 03/08/2021   BILITOT 1.7 (H) 03/08/2021   ALKPHOS 73 03/08/2021   AST 21 03/08/2021   ALT 26 03/08/2021   PROT 7.4 03/08/2021   ALBUMIN 4.3 03/08/2021   CALCIUM 9.4 03/08/2021   ANIONGAP 7 01/19/2020   EGFR 76 03/08/2021   Lab Results  Component Value Date   CHOL 171 09/10/2020   Lab Results  Component Value Date   HDL 44 09/10/2020   Lab Results  Component Value Date   LDLCALC 109 (H) 09/10/2020   Lab Results  Component Value Date   TRIG 98 09/10/2020   Lab Results  Component Value Date   CHOLHDL 3.9 09/10/2020   Lab Results  Component Value  Date   HGBA1C 4.7 04/21/2017       Assessment & Plan:   Problem List Items Addressed This Visit  None    No orders of the defined types were placed in this encounter.  PLAN Skelaxin and depo medrol for pain Reviewed stretching and nonpharm Dg c spine ordered, will follow up on results. Considering Ortho vs. Neuro vs. PT Shingrix first shot given today, return when due for second Encourage covid booster in 3 mo Patient encouraged to call clinic with any questions, comments, or concerns.   Maximiano Coss, NP

## 2021-06-12 ENCOUNTER — Other Ambulatory Visit: Payer: Self-pay | Admitting: Emergency Medicine

## 2021-06-12 DIAGNOSIS — G2581 Restless legs syndrome: Secondary | ICD-10-CM

## 2021-06-19 ENCOUNTER — Ambulatory Visit
Admission: RE | Admit: 2021-06-19 | Discharge: 2021-06-19 | Disposition: A | Discharge status: Home / Self Care | Payer: BC Managed Care – PPO | Source: Ambulatory Visit | Attending: Registered Nurse | Admitting: Registered Nurse

## 2021-06-19 DIAGNOSIS — M542 Cervicalgia: Secondary | ICD-10-CM

## 2021-06-20 ENCOUNTER — Telehealth: Payer: Self-pay

## 2021-06-20 NOTE — Telephone Encounter (Signed)
Drew Jenkins called and wanted to get Drew Jenkins to go over his x-rays he had done yesterday.   Pt call back [phone]

## 2021-06-21 NOTE — Telephone Encounter (Signed)
Left a voicemail message with imaging results and for the patient to call back to let us know how he would like to proceed.

## 2021-06-21 NOTE — Telephone Encounter (Signed)
Patient called back. Results given per provider. Patient would like to do the conservative treatment with medication and would also like to know if this would qualify him for disability if he applies.

## 2021-06-25 NOTE — Telephone Encounter (Signed)
Left a voicemail message with provider information.

## 2021-06-25 NOTE — Telephone Encounter (Signed)
Likely not at this time- we would have to exhaust treatment options - both conservative and more involved, in order to get anywhere closer to disability  Thank you  Luan Pulling

## 2021-06-27 ENCOUNTER — Other Ambulatory Visit: Payer: Self-pay | Admitting: Registered Nurse

## 2021-06-27 ENCOUNTER — Other Ambulatory Visit: Payer: Self-pay

## 2021-06-27 ENCOUNTER — Encounter: Payer: Self-pay | Admitting: Registered Nurse

## 2021-06-27 DIAGNOSIS — M542 Cervicalgia: Secondary | ICD-10-CM

## 2021-06-27 NOTE — Telephone Encounter (Signed)
Would be an ortho referral - wants intraarticular inj - I don't do those unfortunately  Thanks  Rich

## 2021-06-28 ENCOUNTER — Telehealth: Payer: Self-pay

## 2021-06-28 NOTE — Telephone Encounter (Signed)
Called patient no answer. Left detailed VM. Needs appt with Dr.Xu first.

## 2021-06-28 NOTE — Telephone Encounter (Signed)
Last filled 06/07/21 #60 with no refills Last visit 06/07/21 Next visit none

## 2021-06-28 NOTE — Telephone Encounter (Signed)
Drew Antonio, MD  Berneice Gandy, RMA; Albertina Parr, RMA ZS:MOLMBEM, [place], RT Looks like in 2018 saw Dr. Roda Shutters and had shoulder injection with him then. Reading through all the messages this seems like what he wants repeated. Has no cervical MRI anyway. Please f/up with Dr. Roda Shutters at this point - after his eval he can send to [place] if needed. Thanks.

## 2021-07-01 ENCOUNTER — Telehealth: Payer: Self-pay | Admitting: Physical Medicine and Rehabilitation

## 2021-07-01 NOTE — Telephone Encounter (Signed)
Pt called about an appt with Dr. Alvester Morin. Please call pt at [phone].

## 2021-07-01 NOTE — Telephone Encounter (Signed)
I think the patient is returning your call about an appointment with Dr. Roda Shutters.

## 2021-07-01 NOTE — Telephone Encounter (Signed)
Call # 2   No answer LMOM

## 2021-07-02 NOTE — Telephone Encounter (Signed)
Called patient no answer LMOM.  NEEDS APPT WITH DR Roda Shutters

## 2021-07-07 ENCOUNTER — Other Ambulatory Visit: Payer: Self-pay | Admitting: Registered Nurse

## 2021-07-07 DIAGNOSIS — M5442 Lumbago with sciatica, left side: Secondary | ICD-10-CM

## 2021-07-07 DIAGNOSIS — G8929 Other chronic pain: Secondary | ICD-10-CM

## 2021-07-08 NOTE — Telephone Encounter (Signed)
LFD 05/06/21 #30 with 1 refill LOV 06/07/21 NOV none

## 2021-07-09 ENCOUNTER — Ambulatory Visit (INDEPENDENT_AMBULATORY_CARE_PROVIDER_SITE_OTHER): Payer: BC Managed Care – PPO | Admitting: Orthopaedic Surgery

## 2021-07-09 ENCOUNTER — Other Ambulatory Visit: Payer: Self-pay

## 2021-07-09 ENCOUNTER — Telehealth: Payer: Self-pay

## 2021-07-09 DIAGNOSIS — G8929 Other chronic pain: Secondary | ICD-10-CM

## 2021-07-09 DIAGNOSIS — M542 Cervicalgia: Secondary | ICD-10-CM

## 2021-07-09 DIAGNOSIS — M25511 Pain in right shoulder: Secondary | ICD-10-CM

## 2021-07-09 DIAGNOSIS — E039 Hypothyroidism, unspecified: Secondary | ICD-10-CM

## 2021-07-09 MED ORDER — LEVOTHYROXINE SODIUM 175 MCG PO TABS: ORAL_TABLET | ORAL | 1 refills | Status: DC

## 2021-07-09 NOTE — Telephone Encounter (Signed)
Medication sent to pharmacy  

## 2021-07-09 NOTE — Telephone Encounter (Signed)
Pt needs refill on the levothyroxine (SYNTHROID) 175 MCG tablet   Send to [facility] [address] [facility] [address]   Pt call back [phone]

## 2021-07-09 NOTE — Progress Notes (Signed)
Office Visit Note   Patient: Drew Jenkins           Date of Birth: 05-11-1966           MRN: [id number] Visit Date: 07/09/2021              Requested by: Janeece Agee, NP [address] [place],  [place] [address] PCP: Janeece Agee, NP   Assessment & Plan: Visit Diagnoses:  1. Chronic right shoulder pain     Plan: Impression is chronic right shoulder pain and cervical radiculopathy.  Based on findings and the fact that his pain has returned we will need to obtain MRI of the cervical spine as well as the right shoulder to evaluate for structural abnormalities.  Follow-up after the MRI.  Follow-Up Instructions: No follow-ups on file.   Orders:  No orders of the defined types were placed in this encounter.  No orders of the defined types were placed in this encounter.     Procedures: No procedures performed   Clinical Data: No additional findings.   Subjective: Chief Complaint  Patient presents with   Neck - Pain   Right Shoulder - Pain    [name] comes in today for chronic neck and right shoulder pain.  I saw him back in 2018 for shoulder pain and subacromial injection was administered at that time and he states that it gave him several years of relief.  However his pain has returned and has also changed and that he started having neck pain with radiation down the arm.  He recently had cervical spine x-ray which showed degenerative disc disease at C4 and C5.  Denies any numbness and tingling.   Review of Systems  Constitutional: Negative.   All other systems reviewed and are negative.   Objective: Vital Signs: There were no vitals taken for this visit.  Physical Exam Vitals and nursing note reviewed.  Constitutional:      Appearance: He is well-developed.  HENT:     Head: Normocephalic and atraumatic.  Eyes:     Pupils: Pupils are equal, round, and reactive to light.  Pulmonary:     Effort: Pulmonary effort is normal.  Abdominal:      Palpations: Abdomen is soft.  Musculoskeletal:        General: Normal range of motion.     Cervical back: Neck supple.  Skin:    General: Skin is warm.  Neurological:     Mental Status: He is alert and oriented to person, place, and time.  Psychiatric:        Behavior: Behavior normal.        Thought Content: Thought content normal.        Judgment: Judgment normal.    Ortho Exam Right shoulder shows normal range of motion with mild pain at the extremes.  Strength is intact.  Pain with empty can testing.  Positive Hawkins sign. Cervical spine is tender to palpation.  Slight limitation range of motion.  Positive Spurling's. Specialty Comments:  No specialty comments available.  Imaging: No results found.   PMFS History: Patient Active Problem List   Diagnosis Date Noted   General weakness 02/24/2019   Need for prophylactic vaccination against Streptococcus pneumoniae (pneumococcus) 09/02/2018   Medication monitoring encounter 03/11/2018   Screening examination for venereal disease 04/09/2017   Encounter for long-term (current) use of high-risk medication 04/09/2017   Depression 11/20/2016   GERD (gastroesophageal reflux disease) 11/20/2016   Human immunodeficiency virus  I infection (HCC) 08/21/2016   Hypothyroidism, adult 08/21/2016   Restless leg 08/21/2016   H/O syphilis 08/21/2016   Overweight (BMI 25.0-29.9) 08/21/2016   Abnormal anal Papanicolaou smear 08/21/2016   Lumbago 08/21/2016   Past Medical History:  Diagnosis Date   HIV infection (HCC)     Family History  Problem Relation Age of Onset   Diabetes Mother    Arthritis/Rheumatoid Mother    Hypertension Father     No past surgical history on file. Social History   Occupational History   Not on file  Tobacco Use   Smoking status: Never   Smokeless tobacco: Never  Substance and Sexual Activity   Alcohol use: No   Drug use: No   Sexual activity: Not Currently    Comment: pt declined condoms

## 2021-07-18 ENCOUNTER — Other Ambulatory Visit: Payer: Self-pay | Admitting: Registered Nurse

## 2021-07-18 DIAGNOSIS — M542 Cervicalgia: Secondary | ICD-10-CM

## 2021-07-18 NOTE — Telephone Encounter (Signed)
LFD 06/28/21 #60 with no refills LOV 06/07/21 NOV none

## 2021-07-26 ENCOUNTER — Ambulatory Visit
Admission: RE | Admit: 2021-07-26 | Discharge: 2021-07-26 | Disposition: A | Discharge status: Home / Self Care | Payer: BC Managed Care – PPO | Source: Ambulatory Visit | Attending: Orthopaedic Surgery | Admitting: Orthopaedic Surgery

## 2021-07-26 DIAGNOSIS — G8929 Other chronic pain: Secondary | ICD-10-CM

## 2021-07-26 DIAGNOSIS — M542 Cervicalgia: Secondary | ICD-10-CM

## 2021-07-29 ENCOUNTER — Other Ambulatory Visit: Payer: Self-pay | Admitting: Registered Nurse

## 2021-07-29 ENCOUNTER — Telehealth: Payer: Self-pay

## 2021-07-29 ENCOUNTER — Telehealth: Payer: Self-pay | Admitting: Radiology

## 2021-07-29 DIAGNOSIS — K219 Gastro-esophageal reflux disease without esophagitis: Secondary | ICD-10-CM

## 2021-07-29 NOTE — Progress Notes (Signed)
Needs appt

## 2021-07-29 NOTE — Telephone Encounter (Signed)
I called patient and advised. He will schedule appointment with PCP for this incidental finding. He will follow up in office to review MRI shoulder and MRI cervical spine as scheduled.

## 2021-07-29 NOTE — Telephone Encounter (Signed)
Diane with [facility] called with STAT results from patient's MRI Cervical Spine. Radiologist requested Impression #4 be called to Dr. Roda Shutters. Please see entire MRI Cervical Spine results for all findings.  Bilobed focus of signal abnormality within the right lower neck, incompletely included in the field of view, but measuring at least 3.8 cm. This may reflect a thyroid nodule. However, alternative etiologies cannot be excluded. A thyroid ultrasound is recommended for initial further evaluation. If this examination is unrevealing, a contrast-enhanced neck CT is recommended.

## 2021-07-29 NOTE — Progress Notes (Signed)
Called patient no answer LMOM. Needs appt to review MRI Report

## 2021-07-29 NOTE — Telephone Encounter (Signed)
duplicate

## 2021-07-29 NOTE — Telephone Encounter (Signed)
Please let patient know of this finding and he needs to see his PCP asap to get further work up.  Thank you very much.

## 2021-07-29 NOTE — Progress Notes (Signed)
Called patient no answer. LMOM. Needs appt to review MRI.

## 2021-08-08 ENCOUNTER — Ambulatory Visit: Payer: BC Managed Care – PPO | Admitting: Orthopaedic Surgery

## 2021-08-08 ENCOUNTER — Telehealth: Payer: Self-pay | Admitting: Registered Nurse

## 2021-08-08 ENCOUNTER — Other Ambulatory Visit: Payer: Self-pay | Admitting: Registered Nurse

## 2021-08-08 ENCOUNTER — Encounter: Payer: Self-pay | Admitting: Orthopaedic Surgery

## 2021-08-08 ENCOUNTER — Other Ambulatory Visit: Payer: Self-pay

## 2021-08-08 DIAGNOSIS — M542 Cervicalgia: Secondary | ICD-10-CM

## 2021-08-08 DIAGNOSIS — M5412 Radiculopathy, cervical region: Secondary | ICD-10-CM

## 2021-08-08 NOTE — Progress Notes (Signed)
   Office Visit Note   Patient: Drew Jenkins           Date of Birth: 05/28/1966           MRN: [id number] Visit Date: 08/08/2021              Requested by: Janeece Agee, NP [address] [place],  [place] [address] PCP: Janeece Agee, NP   Assessment & Plan: Visit Diagnoses:  1. Cervical radiculopathy     Plan: MRI of the right shoulder is relatively benign with some mild tendinosis of the rotator cuff.  The cervical spine MRI shows significant disease at C4-5 and C5-6 with stenosis.  He also has incidental finding of thyroid nodule.  Based on these findings we will refer [name] to Dr. Alvester Morin for cervical spine ESI.  For the thyroid nodule he needs to follow-up with his PCP ASAP.  Follow-Up Instructions: No follow-ups on file.   Orders:  No orders of the defined types were placed in this encounter.  No orders of the defined types were placed in this encounter.     Procedures: No procedures performed   Clinical Data: No additional findings.   Subjective: Chief Complaint  Patient presents with   Right Shoulder - Pain   Neck - Pain    [name] is returning today for MRI review of the right shoulder and cervical spine.  Denies any changes in his symptoms.  Denies any weakness just pain.   Review of Systems   Objective: Vital Signs: There were no vitals taken for this visit.  Physical Exam  Ortho Exam Right upper extremity shows normal and symmetric strength to deltoid, biceps, triceps, wrist extension, wrist flexion, finger flexion, finger abduction and abduction.  Sensation intact. Specialty Comments:  No specialty comments available.  Imaging: No results found.   PMFS History: Patient Active Problem List   Diagnosis Date Noted   General weakness 02/24/2019   Need for prophylactic vaccination against Streptococcus pneumoniae (pneumococcus) 09/02/2018   Medication monitoring encounter 03/11/2018   Screening examination for venereal disease  04/09/2017   Encounter for long-term (current) use of high-risk medication 04/09/2017   Depression 11/20/2016   GERD (gastroesophageal reflux disease) 11/20/2016   Human immunodeficiency virus I infection (HCC) 08/21/2016   Hypothyroidism, adult 08/21/2016   Restless leg 08/21/2016   H/O syphilis 08/21/2016   Overweight (BMI 25.0-29.9) 08/21/2016   Abnormal anal Papanicolaou smear 08/21/2016   Lumbago 08/21/2016   Past Medical History:  Diagnosis Date   HIV infection (HCC)     Family History  Problem Relation Age of Onset   Diabetes Mother    Arthritis/Rheumatoid Mother    Hypertension Father     History reviewed. No pertinent surgical history. Social History   Occupational History   Not on file  Tobacco Use   Smoking status: Never   Smokeless tobacco: Never  Substance and Sexual Activity   Alcohol use: No   Drug use: No   Sexual activity: Not Currently    Comment: pt declined condoms

## 2021-08-08 NOTE — Telephone Encounter (Signed)
Patient was told by Dr. Roda Shutters that he needs to get in touch with Gerlene Burdock concerning his MRI .  Please advise.

## 2021-08-08 NOTE — Telephone Encounter (Signed)
LFD 07/18/21 #60 with no refills LOV 06/07/21 NOV none

## 2021-08-08 NOTE — Addendum Note (Signed)
Addended by: Albertina Parr on: 08/08/2021 03:33 PM   Modules accepted: Orders

## 2021-08-08 NOTE — Telephone Encounter (Signed)
Based on review of medication list, patient has not taken Flexeril, will remove from med list.  Temporary refill of Skelaxin given until follow-up plan per PCP.  Will route to primary care provider.

## 2021-08-09 ENCOUNTER — Telehealth: Payer: Self-pay | Admitting: Physical Medicine and Rehabilitation

## 2021-08-09 ENCOUNTER — Telehealth: Payer: Self-pay

## 2021-08-09 DIAGNOSIS — F411 Generalized anxiety disorder: Secondary | ICD-10-CM

## 2021-08-09 NOTE — Telephone Encounter (Signed)
Patient called. He had missed a call from [facility] and would like a call back. [phone]

## 2021-08-09 NOTE — Telephone Encounter (Signed)
Pt would like Rx for his appt on 9/15, Please advise. [id number] - [place] - [address]

## 2021-08-12 MED ORDER — DIAZEPAM 5 MG PO TABS: ORAL_TABLET | ORAL | 0 refills | Status: DC

## 2021-08-14 ENCOUNTER — Telehealth: Payer: Self-pay | Admitting: Physical Medicine and Rehabilitation

## 2021-08-14 NOTE — Telephone Encounter (Signed)
Patient called. He would like to RSC his appointment  

## 2021-08-15 ENCOUNTER — Telehealth: Payer: Self-pay | Admitting: Physical Medicine and Rehabilitation

## 2021-08-15 NOTE — Telephone Encounter (Signed)
Pt called requesting a call back from [facility]. Pt phone number is [phone].

## 2021-08-21 ENCOUNTER — Other Ambulatory Visit: Payer: BC Managed Care – PPO

## 2021-08-22 ENCOUNTER — Telehealth: Payer: Self-pay

## 2021-08-22 NOTE — Telephone Encounter (Signed)
Left patient a voice mail to call back to schedule lab appointment before office visit

## 2021-08-26 ENCOUNTER — Other Ambulatory Visit: Payer: BC Managed Care – PPO

## 2021-08-26 ENCOUNTER — Other Ambulatory Visit (HOSPITAL_COMMUNITY)
Admission: RE | Admit: 2021-08-26 | Discharge: 2021-08-26 | Disposition: A | Discharge status: Home / Self Care | Payer: BC Managed Care – PPO | Source: Ambulatory Visit | Attending: Internal Medicine | Admitting: Internal Medicine

## 2021-08-26 ENCOUNTER — Other Ambulatory Visit: Payer: Self-pay

## 2021-08-26 DIAGNOSIS — B20 Human immunodeficiency virus [HIV] disease: Secondary | ICD-10-CM | POA: Diagnosis not present

## 2021-08-26 DIAGNOSIS — Z79899 Other long term (current) drug therapy: Secondary | ICD-10-CM

## 2021-08-26 DIAGNOSIS — Z113 Encounter for screening for infections with a predominantly sexual mode of transmission: Secondary | ICD-10-CM | POA: Insufficient documentation

## 2021-08-27 LAB — T-HELPER CELL (CD4) - (RCID CLINIC ONLY)
CD4 % Helper T Cell: 24 % — ABNORMAL LOW (ref 33–65)
CD4 T Cell Abs: 813 /uL (ref 400–1790)

## 2021-08-27 LAB — URINE CYTOLOGY ANCILLARY ONLY
Chlamydia: NEGATIVE
Comment: NEGATIVE
Comment: NORMAL
Neisseria Gonorrhea: NEGATIVE

## 2021-08-28 LAB — COMPLETE METABOLIC PANEL WITH GFR
AG Ratio: 1.5 (calc) (ref 1.0–2.5)
ALT: 32 U/L (ref 9–46)
AST: 24 U/L (ref 10–35)
Albumin: 4.6 g/dL (ref 3.6–5.1)
Alkaline phosphatase (APISO): 64 U/L (ref 35–144)
BUN: 10 mg/dL (ref 7–25)
CO2: 30 mmol/L (ref 20–32)
Calcium: 9.6 mg/dL (ref 8.6–10.3)
Chloride: 103 mmol/L (ref 98–110)
Creat: 0.92 mg/dL (ref 0.70–1.30)
Globulin: 3.1 g/dL (calc) (ref 1.9–3.7)
Glucose, Bld: 81 mg/dL (ref 65–99)
Potassium: 4.4 mmol/L (ref 3.5–5.3)
Sodium: 140 mmol/L (ref 135–146)
Total Bilirubin: 1.5 mg/dL — ABNORMAL HIGH (ref 0.2–1.2)
Total Protein: 7.7 g/dL (ref 6.1–8.1)
eGFR: 97 mL/min/{1.73_m2} (ref >=60)

## 2021-08-28 LAB — CBC WITH DIFFERENTIAL/PLATELET
Absolute Monocytes: 704 cells/uL (ref 200–950)
Basophils Absolute: 48 cells/uL (ref 0–200)
Basophils Relative: 0.6 %
Eosinophils Absolute: 824 cells/uL — ABNORMAL HIGH (ref 15–500)
Eosinophils Relative: 10.3 %
HCT: 45.2 % (ref 38.5–50.0)
Hemoglobin: 15.7 g/dL (ref 13.2–17.1)
Lymphs Abs: 3704 cells/uL (ref 850–3900)
MCH: 30.8 pg (ref 27.0–33.0)
MCHC: 34.7 g/dL (ref 32.0–36.0)
MCV: 88.6 fL (ref 80.0–100.0)
MPV: 9.4 fL (ref 7.5–12.5)
Monocytes Relative: 8.8 %
Neutro Abs: 2720 cells/uL (ref 1500–7800)
Neutrophils Relative %: 34 %
Platelets: 326 10*3/uL (ref 140–400)
RBC: 5.1 10*6/uL (ref 4.20–5.80)
RDW: 13 % (ref 11.0–15.0)
Total Lymphocyte: 46.3 %
WBC: 8 10*3/uL (ref 3.8–10.8)

## 2021-08-28 LAB — LIPID PANEL
Cholesterol: 181 mg/dL (ref <200)
HDL: 49 mg/dL (ref >=40)
LDL Cholesterol (Calc): 110 mg/dL (calc) — ABNORMAL HIGH
Non-HDL Cholesterol (Calc): 132 mg/dL (calc) — ABNORMAL HIGH (ref <130)
Total CHOL/HDL Ratio: 3.7 (calc) (ref <5.0)
Triglycerides: 109 mg/dL (ref <150)

## 2021-08-28 LAB — RPR TITER: RPR Titer: 1:1 {titer} — ABNORMAL HIGH

## 2021-08-28 LAB — FLUORESCENT TREPONEMAL AB(FTA)-IGG-BLD: Fluorescent Treponemal ABS: REACTIVE — AB

## 2021-08-28 LAB — RPR: RPR Ser Ql: REACTIVE — AB

## 2021-08-28 LAB — HIV-1 RNA QUANT-NO REFLEX-BLD
HIV 1 RNA Quant: <20 Copies/mL — ABNORMAL HIGH
HIV-1 RNA Quant, Log: <1.3 Log cps/mL — ABNORMAL HIGH

## 2021-08-29 ENCOUNTER — Ambulatory Visit: Payer: BC Managed Care – PPO | Admitting: Physical Medicine and Rehabilitation

## 2021-09-04 ENCOUNTER — Encounter: Payer: BC Managed Care – PPO | Admitting: Internal Medicine

## 2021-09-18 ENCOUNTER — Other Ambulatory Visit: Payer: Self-pay | Admitting: Physical Medicine and Rehabilitation

## 2021-09-18 ENCOUNTER — Other Ambulatory Visit: Payer: Self-pay

## 2021-09-18 ENCOUNTER — Telehealth: Payer: Self-pay

## 2021-09-18 ENCOUNTER — Telehealth: Payer: Self-pay | Admitting: Physical Medicine and Rehabilitation

## 2021-09-18 ENCOUNTER — Ambulatory Visit: Payer: Self-pay

## 2021-09-18 ENCOUNTER — Encounter: Payer: Self-pay | Admitting: Physical Medicine and Rehabilitation

## 2021-09-18 ENCOUNTER — Ambulatory Visit (INDEPENDENT_AMBULATORY_CARE_PROVIDER_SITE_OTHER): Payer: BC Managed Care – PPO | Admitting: Physical Medicine and Rehabilitation

## 2021-09-18 VITALS — BP 111/70 | HR 71

## 2021-09-18 DIAGNOSIS — M5412 Radiculopathy, cervical region: Secondary | ICD-10-CM

## 2021-09-18 MED ORDER — BETAMETHASONE SOD PHOS & ACET 6 (3-3) MG/ML IJ SUSP
12.0000 mg | Freq: Once | INTRAMUSCULAR | Status: AC
  Administered 2021-09-18: 12 mg

## 2021-09-18 MED ORDER — DIAZEPAM 5 MG PO TABS: ORAL_TABLET | ORAL | 0 refills | Status: DC

## 2021-09-18 NOTE — Progress Notes (Signed)
Neck pain, right shoulder pain. Numbness in right hand.  Numeric Pain Rating Scale and Functional Assessment Average Pain 8   In the last MONTH (on 0-10 scale) has pain interfered with the following?  1. General activity like being  able to carry out your everyday physical activities such as walking, climbing stairs, carrying groceries, or moving a chair?  Rating(9)   +Driver, -BT, -Dye Allergies.

## 2021-09-18 NOTE — Procedures (Signed)
Cervical Epidural Steroid Injection - Interlaminar Approach with Fluoroscopic Guidance  Patient: Drew Jenkins      Date of Birth: Oct 28, 1966 MRN: [id number] PCP: Janeece Agee, NP      Visit Date: 09/18/2021   Universal Protocol:    Date/Time: 10/05/221:05 PM  Consent Given By: the patient  Position: PRONE  Additional Comments: Vital signs were monitored before and after the procedure. Patient was prepped and draped in the usual sterile fashion. The correct patient, procedure, and site was verified.   Injection Procedure Details:   Procedure diagnoses: Cervical radiculopathy [M54.12]    Meds Administered:  Meds ordered this encounter  Medications   betamethasone acetate-betamethasone sodium phosphate (CELESTONE) injection 12 mg     Laterality: Right  Location/Site: C7-T1  Needle: 3.5 in., 20 ga. Tuohy  Needle Placement: Paramedian epidural space  Findings:  -Comments: Excellent flow of contrast into the epidural space.  Procedure Details: Using a paramedian approach from the side mentioned above, the region overlying the inferior lamina was localized under fluoroscopic visualization and the soft tissues overlying this structure were infiltrated with 4 ml. of 1% Lidocaine without Epinephrine. A # 20 gauge, Tuohy needle was inserted into the epidural space using a paramedian approach.  The epidural space was localized using loss of resistance along with contralateral oblique bi-planar fluoroscopic views.  After negative aspirate for air, blood, and CSF, a 2 ml. volume of Isovue-250 was injected into the epidural space and the flow of contrast was observed. Radiographs were obtained for documentation purposes.   The injectate was administered into the level noted above.  Additional Comments:  The patient tolerated the procedure well Dressing: 2 x 2 sterile gauze and Band-Aid    Post-procedure details: Patient was observed during the procedure. Post-procedure  instructions were reviewed.  Patient left the clinic in stable condition.

## 2021-09-18 NOTE — Patient Instructions (Signed)

## 2021-09-18 NOTE — Telephone Encounter (Signed)
Patient is requesting Valium prior to his injection. [facility] [place].

## 2021-09-18 NOTE — Progress Notes (Signed)
Drew Jenkins - 55 y.o. male MRN [id number]  Date of birth: [birth year]  Office Visit Note: Visit Date: 09/18/2021 PCP: Janeece Agee, NP Referred by: Janeece Agee, NP  Subjective: Chief Complaint  Patient presents with   Neck - Pain   HPI:  Drew Jenkins is a 55 y.o. male who comes in today at the request of Dr. Glee Arvin for planned Right C7-T1 Cervical Interlaminar epidural steroid injection with fluoroscopic guidance.  The patient has failed conservative care including home exercise, medications, time and activity modification.  This injection will be diagnostic and hopefully therapeutic.  Please see requesting physician notes for further details and justification. MRI reviewed with images and spine model.  MRI reviewed in the note below.     ROS Otherwise per HPI.  Assessment & Plan: Visit Diagnoses:    ICD-10-CM   1. Cervical radiculopathy  M54.12 XR C-ARM NO REPORT    Epidural Steroid injection    betamethasone acetate-betamethasone sodium phosphate (CELESTONE) injection 12 mg      Plan: No additional findings.   Meds & Orders:  Meds ordered this encounter  Medications   betamethasone acetate-betamethasone sodium phosphate (CELESTONE) injection 12 mg    Orders Placed This Encounter  Procedures   XR C-ARM NO REPORT   Epidural Steroid injection    Follow-up: Return for visit to requesting physician as needed.   Procedures: No procedures performed  Cervical Epidural Steroid Injection - Interlaminar Approach with Fluoroscopic Guidance  Patient: Drew Jenkins      Date of Birth: [birth year] MRN: [id number] PCP: Janeece Agee, NP      Visit Date: 09/18/2021   Universal Protocol:    Date/Time: 10/05/221:05 PM  Consent Given By: the patient  Position: PRONE  Additional Comments: Vital signs were monitored before and after the procedure. Patient was prepped and draped in the usual sterile fashion. The correct patient, procedure, and site was  verified.   Injection Procedure Details:   Procedure diagnoses: Cervical radiculopathy [M54.12]    Meds Administered:  Meds ordered this encounter  Medications   betamethasone acetate-betamethasone sodium phosphate (CELESTONE) injection 12 mg     Laterality: Right  Location/Site: C7-T1  Needle: 3.5 in., 20 ga. Tuohy  Needle Placement: Paramedian epidural space  Findings:  -Comments: Excellent flow of contrast into the epidural space.  Procedure Details: Using a paramedian approach from the side mentioned above, the region overlying the inferior lamina was localized under fluoroscopic visualization and the soft tissues overlying this structure were infiltrated with 4 ml. of 1% Lidocaine without Epinephrine. A # 20 gauge, Tuohy needle was inserted into the epidural space using a paramedian approach.  The epidural space was localized using loss of resistance along with contralateral oblique bi-planar fluoroscopic views.  After negative aspirate for air, blood, and CSF, a 2 ml. volume of Isovue-250 was injected into the epidural space and the flow of contrast was observed. Radiographs were obtained for documentation purposes.   The injectate was administered into the level noted above.  Additional Comments:  The patient tolerated the procedure well Dressing: 2 x 2 sterile gauze and Band-Aid    Post-procedure details: Patient was observed during the procedure. Post-procedure instructions were reviewed.  Patient left the clinic in stable condition.   Clinical History: MRI CERVICAL SPINE WITHOUT CONTRAST   TECHNIQUE: Multiplanar, multisequence MR imaging of the cervical spine was performed. No intravenous contrast was administered.   COMPARISON:  Radiographs of the cervical spine 06/19/2021.   FINDINGS: Alignment:  Straightening of the expected cervical lordosis. Trace C4-C5 grade 1 retrolisthesis.   Vertebrae: Vertebral body height is maintained. Degenerative endplate  irregularity and moderate fatty degenerative endplate marrow signal at C4-C5. No significant marrow edema or focal suspicious osseous lesion.   Cord: No spinal cord signal abnormality is identified.   Posterior Fossa, vertebral arteries, paraspinal tissues: No abnormality mild cerebellar atrophy. No acute finding within included portions of the posterior fossa. Flow voids preserved within the imaged cervical vertebral arteries. Paraspinal soft tissues unremarkable. Bilobed focus of T2 hyperintense signal abnormality within the right lower neck, incompletely included in the field of view, but measuring at least 3.8 cm (for instance as seen on series 7, image 26).   Disc levels:   Multilevel disc degeneration. Most notably, there is moderate to moderately advanced disc degeneration at C4-C5.   C2-C3: No significant disc herniation or stenosis.   C3-C4: Minimal uncovertebral hypertrophy on the left. No significant disc herniation or stenosis.   C4-C5: Trace grade 1 retrolisthesis. Posterior disc osteophyte complex with bilateral disc osteophyte ridge/uncinate hypertrophy. Mild facet arthrosis and ligamentum flavum hypertrophy. Mild spinal canal stenosis (without significant spinal cord mass effect). Bilateral neural foraminal narrowing (severe right, moderate left).   C5-C6: Disc bulge. Superimposed broad-based right center/foraminal disc protrusion (for instance as seen on series 3, image 7). Associated right-sided uncovertebral hypertrophy. Mild facet arthrosis. Mild relative spinal canal narrowing (without spinal cord mass effect). Moderate right neural foraminal narrowing. Additionally, the disc protrusion could affect the exiting right C6 nerve root within the right foraminal entry zone. No significant left foraminal stenosis.   C6-C7: Shallow left center disc protrusion. Minimal endplate spurring. No significant spinal canal stenosis. The disc protrusion mildly narrows  the left foraminal entry zone. No significant right foraminal stenosis.   C7-T1: No significant disc herniation or stenosis.   Impression #4 will be called to the ordering clinician or representative by the Radiologist Assistant, and communication documented in the PACS or [employer].   IMPRESSION: Cervical spondylosis, as outlined and with findings most notably as follows.   At C4-C5, there is trace grade 1 retrolisthesis. Moderate to moderately advanced disc degeneration. Posterior disc osteophyte complex with bilateral disc osteophyte ridge/uncinate hypertrophy. Mild facet arthrosis and ligamentum flavum hypertrophy. Mild spinal canal stenosis (without spinal cord mass effect). Bilateral neural foraminal narrowing (severe right, moderate left).   At C5-C6, there is mild disc degeneration. Disc bulge. Superimposed broad-based right center/foraminal disc protrusion. Associated right-sided uncovertebral hypertrophy. Mild facet arthrosis. Mild relative spinal canal narrowing (without spinal cord mass effect). Moderate right neural foraminal narrowing. Additionally, the disc protrusion could affect the exiting right C6 nerve root within the right foraminal entry zone.   Bilobed focus of signal abnormality within the right lower neck, incompletely included in the field of view, but measuring at least 3.8 cm. This may reflect a thyroid nodule. However, alternative etiologies cannot be excluded. A thyroid ultrasound is recommended for initial further evaluation. If this examination is unrevealing, a contrast-enhanced neck CT is recommended.     Electronically Signed   By: Jackey Loge D.O.   On: 07/26/2021 18:47     Objective:  VS:  HT:    WT:   BMI:     BP:111/70  HR:71bpm  TEMP: ( )  RESP:  Physical Exam Vitals and nursing note reviewed.  Constitutional:      General: He is not in acute distress.    Appearance: Normal appearance. He is not ill-appearing.   HENT:  Head: Normocephalic and atraumatic.     Right Ear: External ear normal.     Left Ear: External ear normal.  Eyes:     Extraocular Movements: Extraocular movements intact.  Cardiovascular:     Rate and Rhythm: Normal rate.     Pulses: Normal pulses.  Abdominal:     General: There is no distension.     Palpations: Abdomen is soft.  Musculoskeletal:        General: No signs of injury.     Cervical back: Neck supple. Tenderness present. No rigidity.     Right lower leg: No edema.     Left lower leg: No edema.     Comments: Patient has good strength in the upper extremities with 5 out of 5 strength in wrist extension long finger flexion APB.  No intrinsic hand muscle atrophy.  Negative Hoffmann's test.  Lymphadenopathy:     Cervical: No cervical adenopathy.  Skin:    Findings: No erythema or rash.  Neurological:     General: No focal deficit present.     Mental Status: He is alert and oriented to person, place, and time.     Sensory: No sensory deficit.     Motor: No weakness or abnormal muscle tone.     Coordination: Coordination normal.  Psychiatric:        Mood and Affect: Mood normal.        Behavior: Behavior normal.     Imaging: No results found.

## 2021-09-23 NOTE — Telephone Encounter (Signed)
NA

## 2021-10-01 ENCOUNTER — Other Ambulatory Visit: Payer: Self-pay

## 2021-10-01 ENCOUNTER — Telehealth: Payer: Self-pay

## 2021-10-01 DIAGNOSIS — G2581 Restless legs syndrome: Secondary | ICD-10-CM

## 2021-10-01 MED ORDER — GABAPENTIN 400 MG PO CAPS
400.0000 mg | ORAL_CAPSULE | Freq: Every day | ORAL | 3 refills | Status: DC
Start: 2021-10-01 — End: 2022-03-25

## 2021-10-01 NOTE — Telephone Encounter (Signed)
Caller name:Davied Lura Em callback [phone]  Encourage patient to contact the pharmacy for refills or they can request refills through [facility]  LAST APPOINTMENT DATE:  06/22 NEXT APPOINTMENT DATE: None   MEDICATION NAME & DOSE:gabapentin (NEURONTIN) 400 MG capsule    Is the patient out of medication? No  [id number] [place] [place] - [address]  [address], [place] [address]   Let patient know to contact pharmacy at the end of the day to make sure medication is ready.  Please notify patient to allow 48-72 hours to process  (CLINICAL TO FILL OR ROUTE PER PROTOCOLS)

## 2021-10-01 NOTE — Telephone Encounter (Signed)
Rx has been filled and sent to patient pharmacy. 

## 2021-10-06 NOTE — Telephone Encounter (Signed)
This concern has been previously addressed by myself and/or another provider.  If they patient has ongoing concerns, they can contact me at their convenience.  Thank you,  Rich Elihue Ebert, NP 

## 2021-10-06 NOTE — Telephone Encounter (Signed)
Thank you Rich!

## 2021-10-11 ENCOUNTER — Encounter: Payer: Self-pay | Admitting: Internal Medicine

## 2021-10-11 ENCOUNTER — Ambulatory Visit (INDEPENDENT_AMBULATORY_CARE_PROVIDER_SITE_OTHER): Payer: BC Managed Care – PPO | Admitting: Internal Medicine

## 2021-10-11 ENCOUNTER — Other Ambulatory Visit (HOSPITAL_COMMUNITY): Payer: Self-pay

## 2021-10-11 ENCOUNTER — Other Ambulatory Visit: Payer: Self-pay

## 2021-10-11 ENCOUNTER — Ambulatory Visit (INDEPENDENT_AMBULATORY_CARE_PROVIDER_SITE_OTHER): Payer: BC Managed Care – PPO

## 2021-10-11 VITALS — BP 131/79 | HR 57 | Temp 98.0°F | Wt 169.0 lb

## 2021-10-11 DIAGNOSIS — Z113 Encounter for screening for infections with a predominantly sexual mode of transmission: Secondary | ICD-10-CM | POA: Diagnosis not present

## 2021-10-11 DIAGNOSIS — B20 Human immunodeficiency virus [HIV] disease: Secondary | ICD-10-CM | POA: Diagnosis not present

## 2021-10-11 DIAGNOSIS — Z5181 Encounter for therapeutic drug level monitoring: Secondary | ICD-10-CM | POA: Diagnosis not present

## 2021-10-11 DIAGNOSIS — Z23 Encounter for immunization: Secondary | ICD-10-CM | POA: Diagnosis not present

## 2021-10-11 MED ORDER — BICTEGRAVIR-EMTRICITAB-TENOFOV 50-200-25 MG PO TABS: 1.0000 | ORAL_TABLET | Freq: Every day | ORAL | 11 refills | Status: DC

## 2021-10-11 NOTE — Assessment & Plan Note (Addendum)
Screened negative.  No sexual activity.  Discussed monkeypox vaccine and low risk at this time.  He will call if he wants to consider it.  Discussed the reactive RPR and low titer, c/w old and treated infection

## 2021-10-11 NOTE — Progress Notes (Signed)
   Covid-19 Vaccination Clinic  Name:  Drew Jenkins    MRN: [id number] DOB: 1966/12/02  10/11/2021  [name] was observed post Covid-19 immunization for 15 minutes without incident. He was provided with Vaccine Information Sheet and instruction to access the V-Safe system.   [name] was instructed to call 911 with any severe reactions post vaccine: Difficulty breathing  Swelling of face and throat  A fast heartbeat  A bad rash all over body  Dizziness and weakness   Immunizations Administered     Name Date Dose VIS Date Route   Pfizer Covid-19 Vaccine Bivalent Booster 10/11/2021  9:53 AM 0.3 mL 08/14/2021 Intramuscular   Manufacturer: [employer], [employer]   Lot: [id number]   NDC: [address]-2     Juanita Laster, RMA

## 2021-10-11 NOTE — Assessment & Plan Note (Signed)
Creat, LFTs wnl.  T bili stable.

## 2021-10-11 NOTE — Assessment & Plan Note (Addendum)
Doing well, no concerns and will continue with Biktarvy.  rtc in 6 months.   COVID booster given today

## 2021-10-11 NOTE — Progress Notes (Signed)
   Subjective:    Patient ID: Drew Jenkins, male    DOB: [birth year], 55 y.o.   MRN: [id number]  HPI Here for follow up of HIV He continues on Biktarvy with no missed doses.  No new concerns and CD4 813 and viral load < 20.  Some ongoing shoulder pain and received steroid injections with little relief.  No issues with getting, taking or tolerating the medication.    Review of Systems  Gastrointestinal:  Negative for diarrhea and nausea.  Skin:  Negative for rash.      Objective:   Physical Exam Eyes:     General: No scleral icterus. Pulmonary:     Effort: Pulmonary effort is normal.  Skin:    Findings: No rash.  Neurological:     General: No focal deficit present.     Mental Status: He is alert.  Psychiatric:        Mood and Affect: Mood normal.    SH: no tobacco      Assessment & Plan:

## 2021-10-15 NOTE — Progress Notes (Signed)
Established Patient Office Visit  Subjective:  Patient ID: Drew Jenkins, male    DOB: 06/14/1966  Age: 55 y.o. MRN: [id number]  CC:  Chief Complaint  Patient presents with   Follow-up    6 month follow up. Patient states he is also having some back pain and some legs for a couple months. Per patient he has been taking ibuprofen with no relief.MRI requested.    HPI Drew Jenkins presents for 6 mo follow up and back pain.  Depression Fluoxetine 40mg  po qd with good effect, no AE. Hopes to continue.  BPPV Occasional symptoms. Meclizine prn. Good effect. Aware of risk of sedation Would like refill.  HIV Continues to follow with ID. No concerns at this time.  Back pain Ongoing 2-3 months. No acute injury or trauma. Notes some bilateral sciatica. No weakness in legs or saddle symptoms. Has been taking ibuprofen with diminishing relief. He has had imaging of lower back in past - imaging from 07/08/2017 reviewed showing diffuse degenerative changes.   Past Medical History:  Diagnosis Date   HIV infection ([place])     No past surgical history on file.  Family History  Problem Relation Age of Onset   Diabetes Mother    Arthritis/Rheumatoid Mother    Hypertension Father     Social History   Socioeconomic History   Marital status: Unknown    Spouse name: Not on file   Number of children: Not on file   Years of education: Not on file   Highest education level: Not on file  Occupational History   Not on file  Tobacco Use   Smoking status: Never   Smokeless tobacco: Never  Substance and Sexual Activity   Alcohol use: No   Drug use: No   Sexual activity: Not Currently    Comment: pt declined condoms  Other Topics Concern   Not on file  Social History Narrative   Not on file   Social Determinants of Health   Financial Resource Strain: Not on file  Food Insecurity: Not on file  Transportation Needs: Not on file  Physical Activity: Not on file  Stress: Not on  file  Social Connections: Not on file  Intimate Partner Violence: Not on file    Outpatient Medications Prior to Visit  Medication Sig Dispense Refill   albuterol (VENTOLIN HFA) 108 (90 Base) MCG/ACT inhaler SMARTSIG:2 Puff(s) By Mouth Every 4 Hours PRN     Multiple Vitamin (MULTIVITAMIN) tablet Take 1 tablet by mouth daily.     vitamin B-12 (CYANOCOBALAMIN) 1000 MCG tablet Take 1,000 mcg by mouth daily.     VITAMIN D PO Take by mouth daily.     bictegravir-emtricitabine-tenofovir AF (BIKTARVY) 50-200-25 MG TABS tablet Take 1 tablet by mouth daily. 30 tablet 11   FLUoxetine (PROZAC) 40 MG capsule Take 1 capsule (40 mg total) by mouth daily. 90 capsule 3   gabapentin (NEURONTIN) 400 MG capsule Take 1 capsule (400 mg total) by mouth at bedtime. 60 capsule 3   levothyroxine (SYNTHROID) 175 MCG tablet TAKE 1 TABLET BY MOUTH DAILY BEFORE BREAKFAST 90 tablet 1   meclizine (ANTIVERT) 25 MG tablet Take 1 tablet (25 mg total) by mouth 3 (three) times daily as needed for dizziness. 30 tablet 1   pantoprazole (PROTONIX) 20 MG tablet Take 1 tablet (20 mg total) by mouth daily. 90 tablet 3   No facility-administered medications prior to visit.    Allergies  Allergen Reactions   Oxycodone Itching  ROS Review of Systems  Constitutional: Negative.   HENT: Negative.    Eyes: Negative.   Respiratory: Negative.    Cardiovascular: Negative.   Gastrointestinal: Negative.   Genitourinary: Negative.   Musculoskeletal:  Positive for back pain.  Skin: Negative.   Neurological: Negative.   Psychiatric/Behavioral: Negative.    All other systems reviewed and are negative.    Objective:    Physical Exam Constitutional:      General: He is not in acute distress.    Appearance: Normal appearance. He is normal weight. He is not ill-appearing, toxic-appearing or diaphoretic.  Cardiovascular:     Rate and Rhythm: Normal rate and regular rhythm.     Heart sounds: Normal heart sounds. No murmur  heard.   No friction rub. No gallop.  Pulmonary:     Effort: Pulmonary effort is normal. No respiratory distress.     Breath sounds: Normal breath sounds. No stridor. No wheezing, rhonchi or rales.  Chest:     Chest wall: No tenderness.  Musculoskeletal:        General: Tenderness (paraspinal, lower back) present. No swelling, deformity or signs of injury. Normal range of motion.     Right lower leg: No edema.     Left lower leg: No edema.  Neurological:     General: No focal deficit present.     Mental Status: He is alert and oriented to person, place, and time. Mental status is at baseline.  Psychiatric:        Mood and Affect: Mood normal.        Behavior: Behavior normal.        Thought Content: Thought content normal.        Judgment: Judgment normal.    BP 126/78   Pulse 66   Temp 98 F (36.7 C) (Temporal)   Resp 18   Ht $R'5\' 4"'TK$  (1.626 m)   Wt 169 lb 9.6 oz (76.9 kg)   SpO2 97%   BMI 29.11 kg/m  Wt Readings from Last 3 Encounters:  10/11/21 169 lb (76.7 kg)  06/07/21 174 lb (78.9 kg)  03/08/21 169 lb 9.6 oz (76.9 kg)     Health Maintenance Due  Topic Date Due   Zoster Vaccines- Shingrix (2 of 2) 08/02/2021    There are no preventive care reminders to display for this patient.  Lab Results  Component Value Date   TSH 3.350 03/08/2021   Lab Results  Component Value Date   WBC 8.0 08/26/2021   HGB 15.7 08/26/2021   HCT 45.2 08/26/2021   MCV 88.6 08/26/2021   PLT 326 08/26/2021   Lab Results  Component Value Date   NA 140 08/26/2021   K 4.4 08/26/2021   CO2 30 08/26/2021   GLUCOSE 81 08/26/2021   BUN 10 08/26/2021   CREATININE 0.92 08/26/2021   BILITOT 1.5 (H) 08/26/2021   ALKPHOS 73 03/08/2021   AST 24 08/26/2021   ALT 32 08/26/2021   PROT 7.7 08/26/2021   ALBUMIN 4.3 03/08/2021   CALCIUM 9.6 08/26/2021   ANIONGAP 7 01/19/2020   EGFR 97 08/26/2021   Lab Results  Component Value Date   CHOL 181 08/26/2021   Lab Results  Component Value  Date   HDL 49 08/26/2021   Lab Results  Component Value Date   LDLCALC 110 (H) 08/26/2021   Lab Results  Component Value Date   TRIG 109 08/26/2021   Lab Results  Component Value Date   CHOLHDL 3.7 08/26/2021  Lab Results  Component Value Date   HGBA1C 4.7 04/21/2017      Assessment & Plan:   Problem List Items Addressed This Visit       Digestive   GERD (gastroesophageal reflux disease)   Relevant Medications   meclizine (ANTIVERT) 25 MG tablet   pantoprazole (PROTONIX) 20 MG tablet     Endocrine   Hypothyroidism, adult   Relevant Orders   Thyroid Panel With TSH (Completed)   Rheumatoid factor (Completed)   ANA w/Reflex (Completed)     Other   Lumbago   Depression   Relevant Medications   FLUoxetine (PROZAC) 40 MG capsule   Other Visit Diagnoses     Chronic fatigue    -  Primary   Relevant Orders   CBC With Differential (Completed)   Comprehensive metabolic panel (Completed)   Vitamin D, 25-hydroxy (Completed)   Vitamin B12 (Completed)   Bilateral hand pain       Relevant Orders   CBC With Differential (Completed)   Comprehensive metabolic panel (Completed)   Vitamin D, 25-hydroxy (Completed)   Vitamin B12 (Completed)   Family history of rheumatoid arthritis       Relevant Orders   Rheumatoid factor (Completed)   ANA w/Reflex (Completed)   CBC With Differential (Completed)   RLS (restless legs syndrome)       Dizziness       Relevant Medications   meclizine (ANTIVERT) 25 MG tablet       Meds ordered this encounter  Medications   DISCONTD: cyclobenzaprine (FLEXERIL) 5 MG tablet    Sig: Take 1 tablet (5 mg total) by mouth at bedtime as needed for muscle spasms (lower back pain).    Dispense:  30 tablet    Refill:  1    Order Specific Question:   Supervising Provider    Answer:   Carlota Raspberry, JEFFREY R [[id number]]   DISCONTD: gabapentin (NEURONTIN) 400 MG capsule    Sig: Take 1 capsule (400 mg total) by mouth at bedtime.    Dispense:  60 capsule     Refill:  3    Order Specific Question:   Supervising Provider    Answer:   Carlota Raspberry, JEFFREY R [[id number]]   FLUoxetine (PROZAC) 40 MG capsule    Sig: Take 1 capsule (40 mg total) by mouth daily.    Dispense:  90 capsule    Refill:  3    Order Specific Question:   Supervising Provider    Answer:   Carlota Raspberry, JEFFREY R [[id number]]   meclizine (ANTIVERT) 25 MG tablet    Sig: Take 1 tablet (25 mg total) by mouth 3 (three) times daily as needed for dizziness.    Dispense:  30 tablet    Refill:  1    Order Specific Question:   Supervising Provider    Answer:   Carlota Raspberry, JEFFREY R [[id number]]   pantoprazole (PROTONIX) 20 MG tablet    Sig: Take 1 tablet (20 mg total) by mouth daily.    Dispense:  90 tablet    Refill:  3    Order Specific Question:   Supervising Provider    Answer:   Carlota Raspberry, JEFFREY R [[id number]]    Follow-up: No follow-ups on file.   PLAN Refill meds as above. Gabapentin, flexeril for back pain. Can consider imaging if no improvement in 1-2 weeks. Recommend PT and ortho as well if worsening. Labs collected. Will follow up with the patient as warranted. Patient encouraged to call clinic with any  questions, comments, or concerns.  Maximiano Coss, NP

## 2021-10-27 ENCOUNTER — Other Ambulatory Visit: Payer: Self-pay | Admitting: Registered Nurse

## 2021-10-27 DIAGNOSIS — E039 Hypothyroidism, unspecified: Secondary | ICD-10-CM

## 2021-11-01 ENCOUNTER — Telehealth: Payer: Self-pay | Admitting: Physical Medicine and Rehabilitation

## 2021-11-01 DIAGNOSIS — M5412 Radiculopathy, cervical region: Secondary | ICD-10-CM

## 2021-11-01 NOTE — Telephone Encounter (Signed)
Patient called advised the injection only lasted a couple days and would like to know what is her next plan of care. Patient said the pain is traveling down her right arm. The number to contact patient is [phone]

## 2021-11-01 NOTE — Telephone Encounter (Signed)
Right C7-T1 IL on 10/5. Please advise.

## 2021-11-04 ENCOUNTER — Telehealth: Payer: Self-pay | Admitting: Physical Medicine and Rehabilitation

## 2021-11-04 NOTE — Telephone Encounter (Signed)
Pt states he is returning call to Drew Jenkins. Please call pt at [phone].

## 2021-11-04 NOTE — Telephone Encounter (Signed)
Left message #1 to discuss. 

## 2021-11-05 NOTE — Telephone Encounter (Signed)
Patient wants to try another injection. Scheduled for 12/20 per patient request. Patient states that he does not need a driver this time. I advised that we would not be able to do the injection if he does not have a driver.

## 2021-11-05 NOTE — Telephone Encounter (Signed)
See previous message

## 2021-12-01 ENCOUNTER — Encounter: Payer: Self-pay | Admitting: Registered Nurse

## 2021-12-02 ENCOUNTER — Telehealth: Payer: Self-pay | Admitting: Physical Medicine and Rehabilitation

## 2021-12-02 NOTE — Telephone Encounter (Signed)
Patient called needing to reschedule his appointment. Patient advised he's got a real bad cold. The number to contact patient is [phone]

## 2021-12-03 ENCOUNTER — Ambulatory Visit: Payer: BC Managed Care – PPO | Admitting: Physical Medicine and Rehabilitation

## 2022-01-01 ENCOUNTER — Other Ambulatory Visit: Payer: Self-pay | Admitting: Registered Nurse

## 2022-01-01 DIAGNOSIS — E039 Hypothyroidism, unspecified: Secondary | ICD-10-CM

## 2022-01-23 NOTE — Telephone Encounter (Signed)
Erroneous encounter. Please disregard.

## 2022-02-03 ENCOUNTER — Other Ambulatory Visit: Payer: Self-pay

## 2022-02-03 ENCOUNTER — Encounter: Payer: Self-pay | Admitting: Physical Medicine and Rehabilitation

## 2022-02-03 ENCOUNTER — Ambulatory Visit: Payer: BC Managed Care – PPO | Admitting: Physical Medicine and Rehabilitation

## 2022-02-03 ENCOUNTER — Ambulatory Visit: Payer: Self-pay

## 2022-02-03 VITALS — BP 130/76 | HR 66

## 2022-02-03 DIAGNOSIS — M5412 Radiculopathy, cervical region: Secondary | ICD-10-CM | POA: Diagnosis not present

## 2022-02-03 MED ORDER — METHYLPREDNISOLONE ACETATE 80 MG/ML IJ SUSP
80.0000 mg | Freq: Once | INTRAMUSCULAR | Status: AC
  Administered 2022-02-03: 80 mg

## 2022-02-03 NOTE — Progress Notes (Signed)
Pt state neck pain that travels to his right shoulder. Pt state state driving cause a lot of pain. Pt state he takes pain meds to help ease his pain.  Numeric Pain Rating Scale and Functional Assessment Average Pain 7   In the last MONTH (on 0-10 scale) has pain interfered with the following?  1. General activity like being  able to carry out your everyday physical activities such as walking, climbing stairs, carrying groceries, or moving a chair?  Rating(10)   +Driver, -BT, -Dye Allergies.

## 2022-02-03 NOTE — Progress Notes (Signed)
Drew Jenkins - 56 y.o. male MRN [id number]  Date of birth: March 29, 1966  Office Visit Note: Visit Date: 02/03/2022 PCP: Maximiano Coss, NP Referred by: Maximiano Coss, NP  Subjective: Chief Complaint  Patient presents with   Neck - Pain   Right Shoulder - Pain   HPI:  Drew Jenkins is a 56 y.o. male who comes in today for planned repeat Right C7-T1  Cervical Interlaminar epidural steroid injection with fluoroscopic guidance.  The patient has failed conservative care including home exercise, medications, time and activity modification.  This injection will be diagnostic and hopefully therapeutic.  Please see requesting physician notes for further details and justification. Patient received more than 50% pain relief from prior injection.  She is complaining of some mechanical impingement type pain of the right shoulder as well as neck and shoulder pain that he has had in the past.  I have encouraged him to follow-up with Dr. Eduard Roux for his shoulder.  Referring: Dr. Eduard Roux   ROS Otherwise per HPI.  Assessment & Plan: Visit Diagnoses:    ICD-10-CM   1. Cervical radiculopathy  M54.12 XR C-ARM NO REPORT    Epidural Steroid injection    methylPREDNISolone acetate (DEPO-MEDROL) injection 80 mg      Plan: No additional findings.   Meds & Orders:  Meds ordered this encounter  Medications   methylPREDNISolone acetate (DEPO-MEDROL) injection 80 mg    Orders Placed This Encounter  Procedures   XR C-ARM NO REPORT   Epidural Steroid injection    Follow-up: Return if symptoms worsen or fail to improve.   Procedures: No procedures performed  Cervical Epidural Steroid Injection - Interlaminar Approach with Fluoroscopic Guidance  Patient: Drew Jenkins      Date of Birth: [birth year] MRN: [id number] PCP: Maximiano Coss, NP      Visit Date: 02/03/2022   Universal Protocol:    Date/Time: 03/01/237:46 AM  Consent Given By: the patient  Position: PRONE  Additional  Comments: Vital signs were monitored before and after the procedure. Patient was prepped and draped in the usual sterile fashion. The correct patient, procedure, and site was verified.   Injection Procedure Details:   Procedure diagnoses: Cervical radiculopathy [M54.12]    Meds Administered:  Meds ordered this encounter  Medications   methylPREDNISolone acetate (DEPO-MEDROL) injection 80 mg     Laterality: Right  Location/Site: C7-T1  Needle: 3.5 in., 20 ga. Tuohy  Needle Placement: Paramedian epidural space  Findings:  -Comments: Excellent flow of contrast into the epidural space.  Procedure Details: Using a paramedian approach from the side mentioned above, the region overlying the inferior lamina was localized under fluoroscopic visualization and the soft tissues overlying this structure were infiltrated with 4 ml. of 1% Lidocaine without Epinephrine. A # 20 gauge, Tuohy needle was inserted into the epidural space using a paramedian approach.  The epidural space was localized using loss of resistance along with contralateral oblique bi-planar fluoroscopic views.  After negative aspirate for air, blood, and CSF, a 2 ml. volume of Isovue-250 was injected into the epidural space and the flow of contrast was observed. Radiographs were obtained for documentation purposes.   The injectate was administered into the level noted above.  Additional Comments:  The patient tolerated the procedure well Dressing: 2 x 2 sterile gauze and Band-Aid    Post-procedure details: Patient was observed during the procedure. Post-procedure instructions were reviewed.  Patient left the clinic in stable condition.    Clinical History:  MRI CERVICAL SPINE WITHOUT CONTRAST   TECHNIQUE: Multiplanar, multisequence MR imaging of the cervical spine was performed. No intravenous contrast was administered.   COMPARISON:  Radiographs of the cervical spine 06/19/2021.   FINDINGS: Alignment:  Straightening of the expected cervical lordosis. Trace C4-C5 grade 1 retrolisthesis.   Vertebrae: Vertebral body height is maintained. Degenerative endplate irregularity and moderate fatty degenerative endplate marrow signal at C4-C5. No significant marrow edema or focal suspicious osseous lesion.   Cord: No spinal cord signal abnormality is identified.   Posterior Fossa, vertebral arteries, paraspinal tissues: No abnormality mild cerebellar atrophy. No acute finding within included portions of the posterior fossa. Flow voids preserved within the imaged cervical vertebral arteries. Paraspinal soft tissues unremarkable. Bilobed focus of T2 hyperintense signal abnormality within the right lower neck, incompletely included in the field of view, but measuring at least 3.8 cm (for instance as seen on series 7, image 26).   Disc levels:   Multilevel disc degeneration. Most notably, there is moderate to moderately advanced disc degeneration at C4-C5.   C2-C3: No significant disc herniation or stenosis.   C3-C4: Minimal uncovertebral hypertrophy on the left. No significant disc herniation or stenosis.   C4-C5: Trace grade 1 retrolisthesis. Posterior disc osteophyte complex with bilateral disc osteophyte ridge/uncinate hypertrophy. Mild facet arthrosis and ligamentum flavum hypertrophy. Mild spinal canal stenosis (without significant spinal cord mass effect). Bilateral neural foraminal narrowing (severe right, moderate left).   C5-C6: Disc bulge. Superimposed broad-based right center/foraminal disc protrusion (for instance as seen on series 3, image 7). Associated right-sided uncovertebral hypertrophy. Mild facet arthrosis. Mild relative spinal canal narrowing (without spinal cord mass effect). Moderate right neural foraminal narrowing. Additionally, the disc protrusion could affect the exiting right C6 nerve root within the right foraminal entry zone. No significant left foraminal  stenosis.   C6-C7: Shallow left center disc protrusion. Minimal endplate spurring. No significant spinal canal stenosis. The disc protrusion mildly narrows the left foraminal entry zone. No significant right foraminal stenosis.   C7-T1: No significant disc herniation or stenosis.   Impression #4 will be called to the ordering clinician or representative by the Radiologist Assistant, and communication documented in the PACS or [employer].   IMPRESSION: Cervical spondylosis, as outlined and with findings most notably as follows.   At C4-C5, there is trace grade 1 retrolisthesis. Moderate to moderately advanced disc degeneration. Posterior disc osteophyte complex with bilateral disc osteophyte ridge/uncinate hypertrophy. Mild facet arthrosis and ligamentum flavum hypertrophy. Mild spinal canal stenosis (without spinal cord mass effect). Bilateral neural foraminal narrowing (severe right, moderate left).   At C5-C6, there is mild disc degeneration. Disc bulge. Superimposed broad-based right center/foraminal disc protrusion. Associated right-sided uncovertebral hypertrophy. Mild facet arthrosis. Mild relative spinal canal narrowing (without spinal cord mass effect). Moderate right neural foraminal narrowing. Additionally, the disc protrusion could affect the exiting right C6 nerve root within the right foraminal entry zone.   Bilobed focus of signal abnormality within the right lower neck, incompletely included in the field of view, but measuring at least 3.8 cm. This may reflect a thyroid nodule. However, alternative etiologies cannot be excluded. A thyroid ultrasound is recommended for initial further evaluation. If this examination is unrevealing, a contrast-enhanced neck CT is recommended.     Electronically Signed   By: Kellie Simmering D.O.   On: 07/26/2021 18:47     Objective:  VS:  HT:     WT:    BMI:      BP:130/76   HR:66bpm  TEMP: ( )   RESP:  Physical  Exam Vitals and nursing note reviewed.  Constitutional:      General: He is not in acute distress.    Appearance: Normal appearance. He is not ill-appearing.  HENT:     Head: Normocephalic and atraumatic.     Right Ear: External ear normal.     Left Ear: External ear normal.  Eyes:     Extraocular Movements: Extraocular movements intact.  Cardiovascular:     Rate and Rhythm: Normal rate.     Pulses: Normal pulses.  Abdominal:     General: There is no distension.     Palpations: Abdomen is soft.  Musculoskeletal:        General: No signs of injury.     Cervical back: Neck supple. Tenderness present. No rigidity.     Right lower leg: No edema.     Left lower leg: No edema.     Comments: Patient has good strength in the upper extremities with 5 out of 5 strength in wrist extension long finger flexion APB.  No intrinsic hand muscle atrophy.  Negative Hoffmann's test.  Lymphadenopathy:     Cervical: No cervical adenopathy.  Skin:    Findings: No erythema or rash.  Neurological:     General: No focal deficit present.     Mental Status: He is alert and oriented to person, place, and time.     Sensory: No sensory deficit.     Motor: No weakness or abnormal muscle tone.     Coordination: Coordination normal.  Psychiatric:        Mood and Affect: Mood normal.        Behavior: Behavior normal.     Imaging: No results found.

## 2022-02-03 NOTE — Patient Instructions (Signed)

## 2022-02-07 ENCOUNTER — Other Ambulatory Visit: Payer: Self-pay | Admitting: Internal Medicine

## 2022-02-07 DIAGNOSIS — B20 Human immunodeficiency virus [HIV] disease: Secondary | ICD-10-CM

## 2022-02-07 NOTE — Telephone Encounter (Signed)
Rx last sent to [facility] on [address]. Called patient to confirm change in pharmacy, no answer. Sent [employer].   Sandie Ano, RN

## 2022-02-07 NOTE — Telephone Encounter (Signed)
Patient confirmed he uses [facility]. Cancelled Rx with [facility].   Sandie Ano, RN

## 2022-02-12 NOTE — Procedures (Signed)
Cervical Epidural Steroid Injection - Interlaminar Approach with Fluoroscopic Guidance  Patient: Drew Jenkins      Date of Birth: July 25, 1966 MRN: [id number] PCP: Maximiano Coss, NP      Visit Date: 02/03/2022   Universal Protocol:    Date/Time: 03/01/237:46 AM  Consent Given By: the patient  Position: PRONE  Additional Comments: Vital signs were monitored before and after the procedure. Patient was prepped and draped in the usual sterile fashion. The correct patient, procedure, and site was verified.   Injection Procedure Details:   Procedure diagnoses: Cervical radiculopathy [M54.12]    Meds Administered:  Meds ordered this encounter  Medications   methylPREDNISolone acetate (DEPO-MEDROL) injection 80 mg     Laterality: Right  Location/Site: C7-T1  Needle: 3.5 in., 20 ga. Tuohy  Needle Placement: Paramedian epidural space  Findings:  -Comments: Excellent flow of contrast into the epidural space.  Procedure Details: Using a paramedian approach from the side mentioned above, the region overlying the inferior lamina was localized under fluoroscopic visualization and the soft tissues overlying this structure were infiltrated with 4 ml. of 1% Lidocaine without Epinephrine. A # 20 gauge, Tuohy needle was inserted into the epidural space using a paramedian approach.  The epidural space was localized using loss of resistance along with contralateral oblique bi-planar fluoroscopic views.  After negative aspirate for air, blood, and CSF, a 2 ml. volume of Isovue-250 was injected into the epidural space and the flow of contrast was observed. Radiographs were obtained for documentation purposes.   The injectate was administered into the level noted above.  Additional Comments:  The patient tolerated the procedure well Dressing: 2 x 2 sterile gauze and Band-Aid    Post-procedure details: Patient was observed during the procedure. Post-procedure instructions were  reviewed.  Patient left the clinic in stable condition.

## 2022-02-21 ENCOUNTER — Ambulatory Visit: Payer: BC Managed Care – PPO | Admitting: Orthopaedic Surgery

## 2022-03-04 ENCOUNTER — Encounter: Payer: Self-pay | Admitting: *Deleted

## 2022-03-10 ENCOUNTER — Other Ambulatory Visit: Payer: Self-pay | Admitting: Registered Nurse

## 2022-03-10 DIAGNOSIS — F331 Major depressive disorder, recurrent, moderate: Secondary | ICD-10-CM

## 2022-03-14 ENCOUNTER — Ambulatory Visit: Payer: BC Managed Care – PPO | Admitting: Registered Nurse

## 2022-03-15 IMAGING — CR DG CERVICAL SPINE COMPLETE 4+V
5 series · 5 of 5 positions shown · non-contrast
Comparison: None.

CLINICAL DATA: Acute on chronic neck pain.

EXAM:
CERVICAL SPINE - COMPLETE 4+ VIEW

[w c-spine lat]
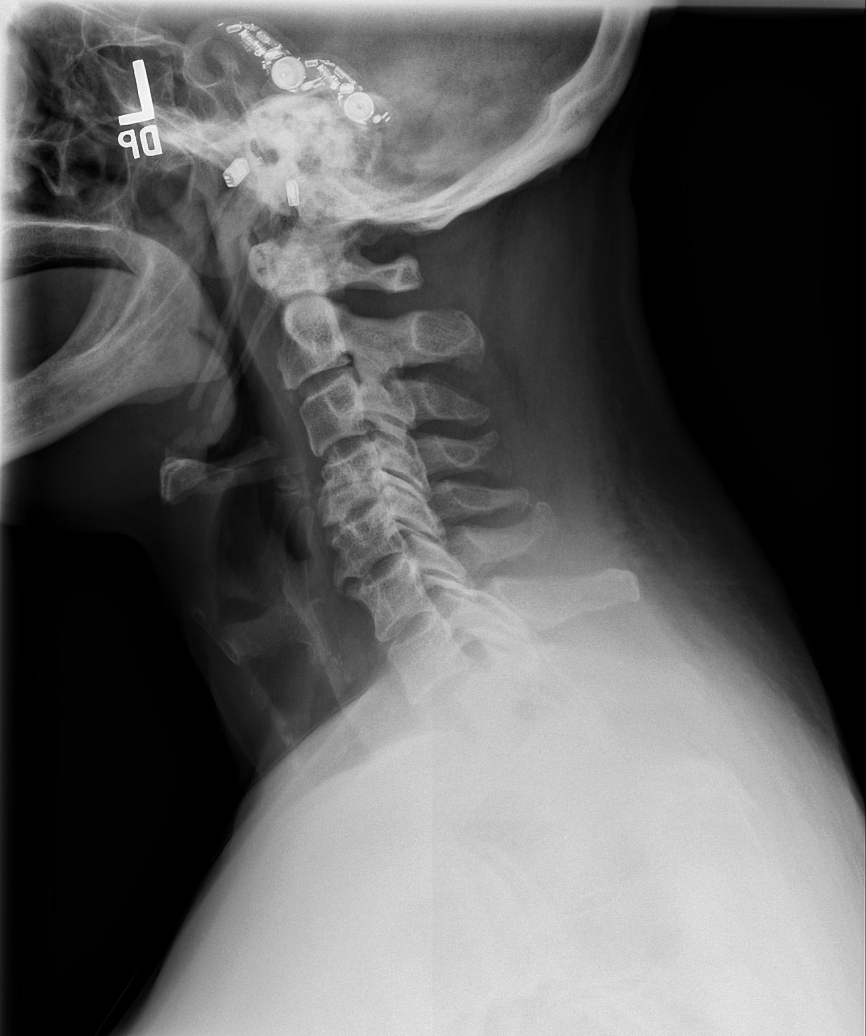

[w c-spine oblique (1 of 2)]
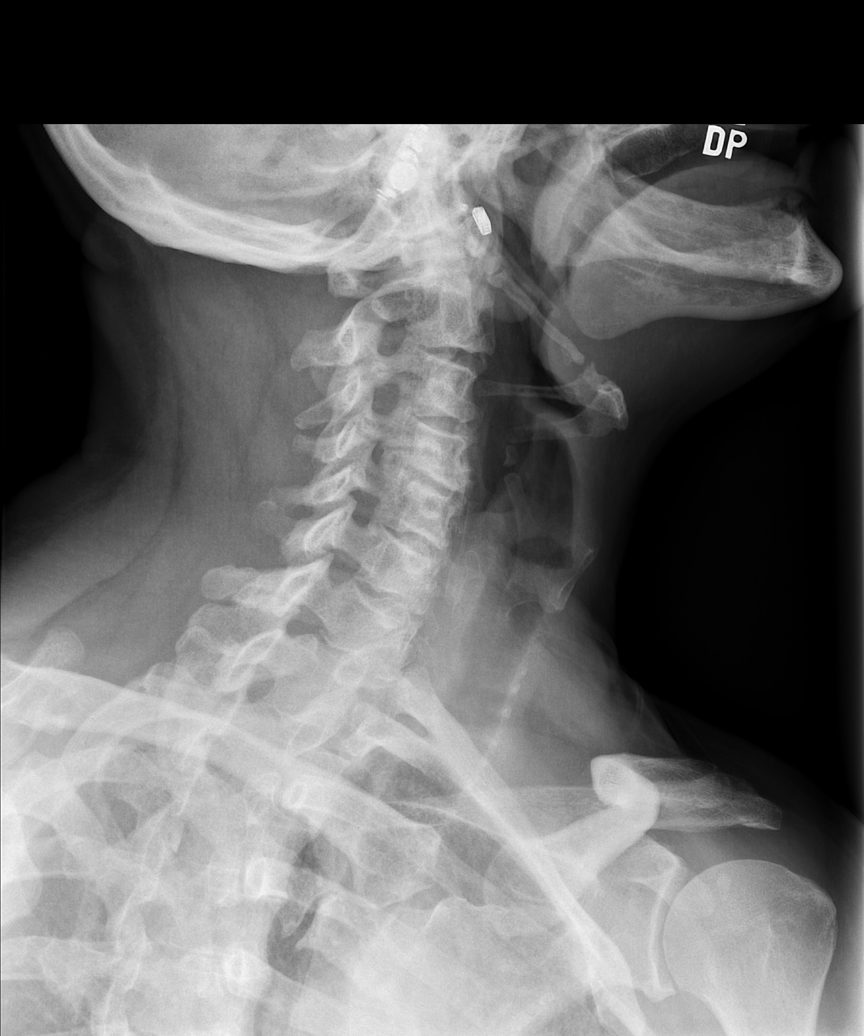

[w c-spine oblique (2 of 2)]
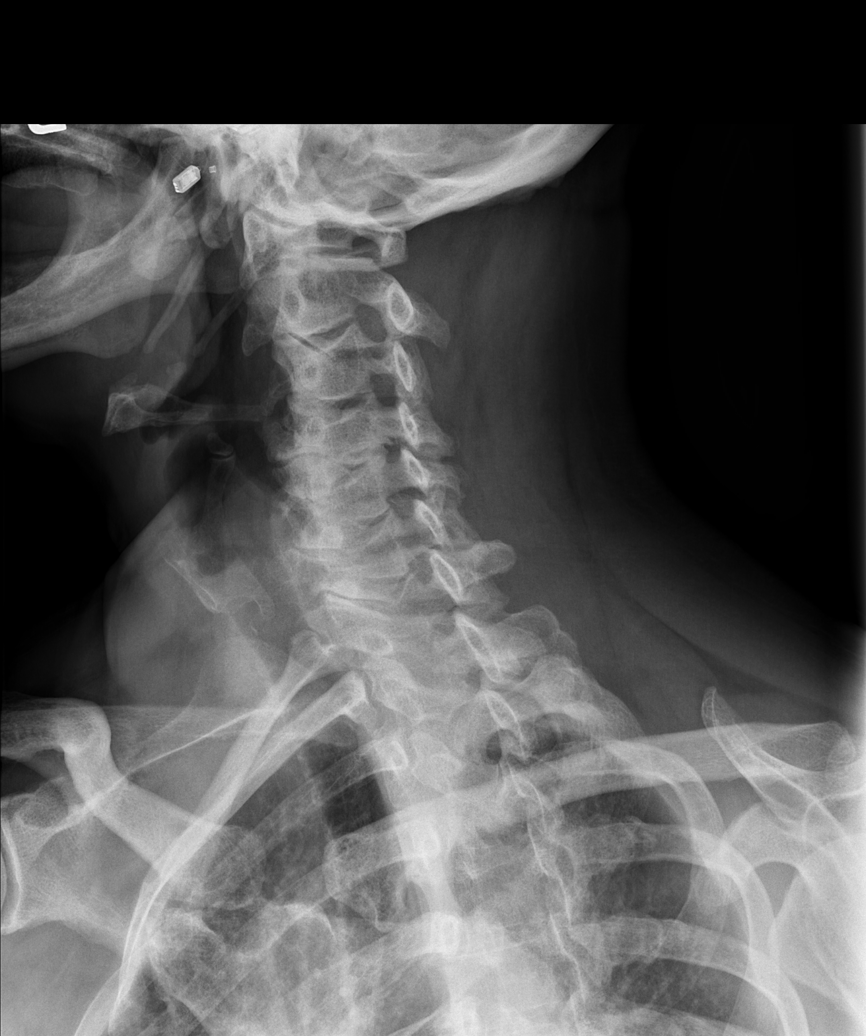

[w c-spine a.p. *]
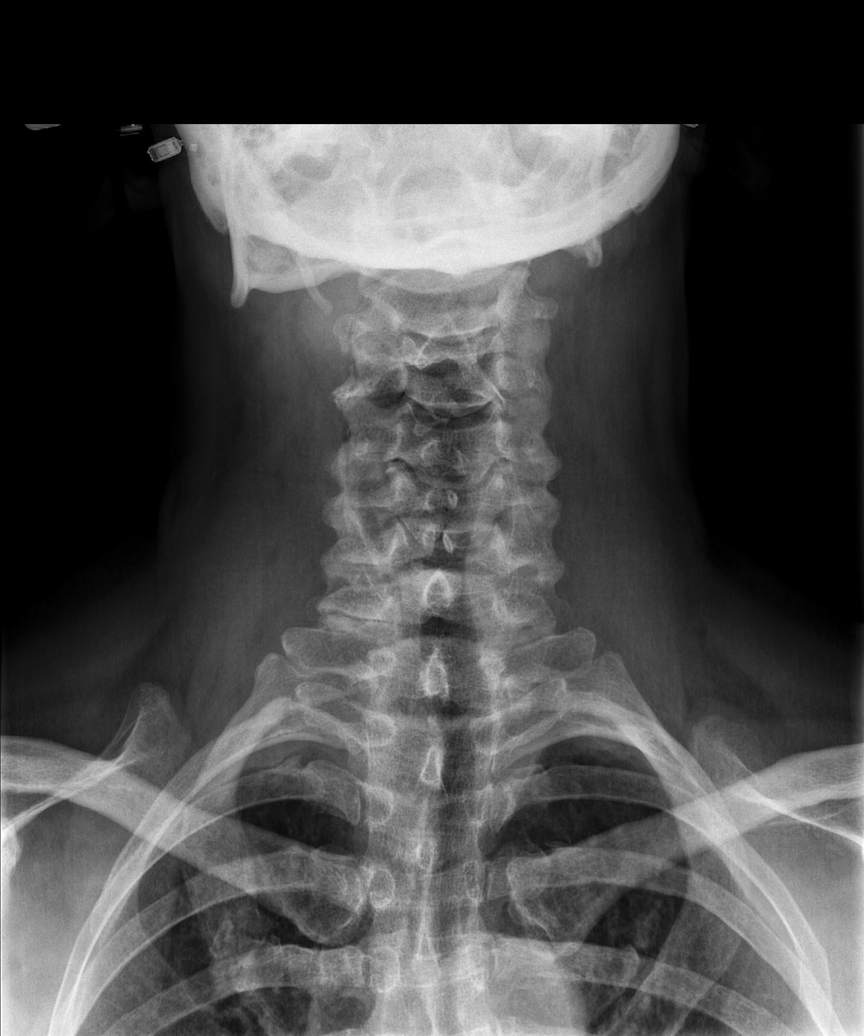

[w c-spine odontoid *]
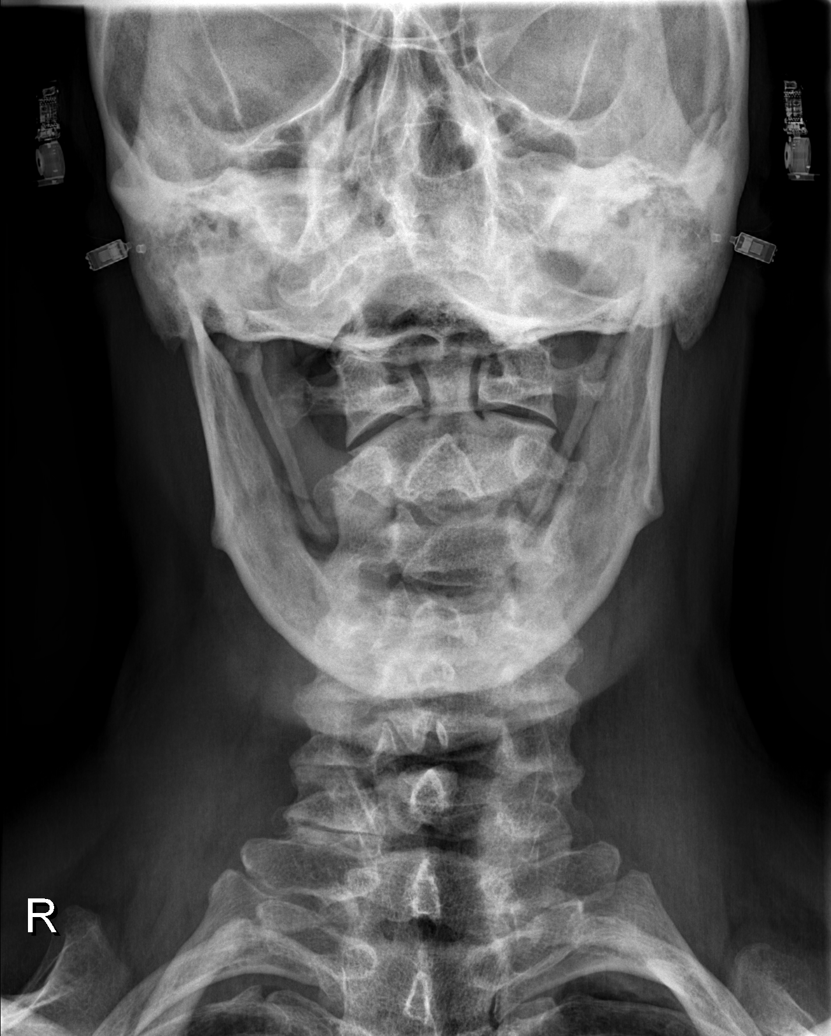

[5 of 5 positions shown; findings below may reference images not displayed]

FINDINGS: On the lateral view the cervical spine is visualized to the level of
c7. There is straightening of the normal cervical lordosis likely
due to degenerative changes. Alignment is otherwise normal.

Dens is well positioned between the lateral masses of C1. There is
limited evaluation of the dens for acute fracture on the open-mouth
view due to overlying osseous structures.

Multilevel degenerative changes of the spine most prominent at the
C4 through C6 levels with at least moderate bilateral osseous neural
foraminal stenosis at the C4-C5 level. No acute displaced fracture
is detected. Cervical disc heights are preserved.No
aggressive-appearing focal osseous lesions.

Pre-vertebral soft tissues are within normal limits.

Bilateral hearing aids noted.
IMPRESSION: 1. No acute displaced fracture or traumatic listhesis of the
cervical spine.
2. Multilevel degenerative changes with at least moderate bilateral
osseous neural foraminal stenosis at the C4-C5 level.

## 2022-03-25 ENCOUNTER — Other Ambulatory Visit: Payer: Self-pay

## 2022-03-25 ENCOUNTER — Encounter: Payer: Self-pay | Admitting: Registered Nurse

## 2022-03-25 ENCOUNTER — Ambulatory Visit (INDEPENDENT_AMBULATORY_CARE_PROVIDER_SITE_OTHER): Payer: BC Managed Care – PPO | Admitting: Registered Nurse

## 2022-03-25 VITALS — BP 112/80 | HR 65 | Temp 98.5°F | Resp 18 | Ht 64.0 in | Wt 169.0 lb

## 2022-03-25 DIAGNOSIS — E041 Nontoxic single thyroid nodule: Secondary | ICD-10-CM | POA: Diagnosis not present

## 2022-03-25 DIAGNOSIS — G2581 Restless legs syndrome: Secondary | ICD-10-CM | POA: Diagnosis not present

## 2022-03-25 MED ORDER — GABAPENTIN 600 MG PO TABS: 600.0000 mg | ORAL_TABLET | Freq: Two times a day (BID) | ORAL | 1 refills | Status: DC

## 2022-03-25 NOTE — Patient Instructions (Addendum)
Mr. Venezia -  ? ?Always a pleasure. ? ?Call with concerns. ? ?I'll let you know how thyroid labs and imaging looks ? ?Thank you ? ?Rich  ? ? ? ?If you have lab work done today you will be contacted with your lab results within the next 2 weeks.  If you have not heard from [place] then please contact us. The fastest way to get your results is to register for My Chart. ? ? ?IF you received an x-ray today, you will receive an invoice from [facility]. Please contact [facility] at [phone] with questions or concerns regarding your invoice.  ? ?IF you received labwork today, you will receive an invoice from [place]. Please contact [facility] at [phone] with questions or concerns regarding your invoice.  ? ?Our billing staff will not be able to assist you with questions regarding bills from these companies. ? ?You will be contacted with the lab results as soon as they are available. The fastest way to get your results is to activate your My Chart account. Instructions are located on the last page of this paperwork. If you have not heard from [place] regarding the results in 2 weeks, please contact this office. ?  ? ? ?

## 2022-03-25 NOTE — Assessment & Plan Note (Signed)
Increase therapy to gabapentin 600mg  po bid prn. ?Reviewed risks, benefits, and side effects, pt voices understanding. ? ?

## 2022-03-25 NOTE — Progress Notes (Signed)
? ?Established Patient Office Visit ? ?Subjective:  ?Patient ID: Drew Jenkins, male    DOB: Feb 14, 1966  Age: 56 y.o. MRN: [id number] ? ?CC:  ?Chief Complaint  ?Patient presents with  ? Follow-up  ?  Patient states he had a MRI and something was abnormal with his thyroid.  ? ? ?HPI ?Drew Jenkins presents for MRI follow up  ? ?Had MRI for investigation of longstanding hx of cervical radiculopathy. ?Noted to have: ?"Bilobed focus of signal abnormality within the right lower neck, ?incompletely included in the field of view, but measuring at least ?3.8 cm. This may reflect a thyroid nodule. However, alternative ?etiologies cannot be excluded. A thyroid ultrasound is recommended ?for initial further evaluation. If this examination is unrevealing, ?a contrast-enhanced neck CT is recommended." ?On MRI as of 07/26/21 and was advised to follow up on recent Ortho visit.  ? ?No symptoms of thyroid dysfunction.  ? ?Did have a TSH on 03/12/20 that was marginally low at 0.282. Otherwise last thyroid labs over past 4 years have been wnl.  ?He did have a partial thyroidectomy in 1987 ? ? ?RLS ?Poor control with gabapentin 400mg  po qhs ?Would like to revisit dosing ?No AE with this medication.  ? ?Outpatient Medications Prior to Visit  ?Medication Sig Dispense Refill  ? albuterol (VENTOLIN HFA) 108 (90 Base) MCG/ACT inhaler SMARTSIG:2 Puff(s) By Mouth Every 4 Hours PRN    ? BIKTARVY 50-200-25 MG TABS tablet TAKE 1 TABLET BY MOUTH 1 TIME A DAY. 30 tablet 3  ? FLUoxetine (PROZAC) 40 MG capsule TAKE 1 CAPSULE (40 MG TOTAL) BY MOUTH DAILY. 90 capsule 3  ? levothyroxine (SYNTHROID) 175 MCG tablet TAKE 1 TABLET BY MOUTH EVERY DAY BEFORE BREAKFAST 90 tablet 1  ? meclizine (ANTIVERT) 25 MG tablet Take 1 tablet (25 mg total) by mouth 3 (three) times daily as needed for dizziness. 30 tablet 1  ? metaxalone (SKELAXIN) 800 MG tablet TAKE 0.5 TO 1 TABLETS BY MOUTH 3 (THREE) TIMES DAILY AS NEEDED FOR MUSCLE SPASMS. 60 tablet 0  ? Multiple  Vitamin (MULTIVITAMIN) tablet Take 1 tablet by mouth daily.    ? vitamin B-12 (CYANOCOBALAMIN) 1000 MCG tablet Take 1,000 mcg by mouth daily.    ? VITAMIN D PO Take by mouth daily.    ? gabapentin (NEURONTIN) 400 MG capsule Take 1 capsule (400 mg total) by mouth at bedtime. 60 capsule 3  ? diazepam (VALIUM) 5 MG tablet Take 1 by mouth 1 hour  pre-procedure with very light food. May bring 2nd tablet to appointment. 2 tablet 0  ? pantoprazole (PROTONIX) 20 MG tablet Take 1 tablet (20 mg total) by mouth daily. 90 tablet 3  ? ?No facility-administered medications prior to visit.  ? ? ?Review of Systems  ?Constitutional: Negative.   ?HENT: Negative.    ?Eyes: Negative.   ?Respiratory: Negative.    ?Cardiovascular: Negative.   ?Gastrointestinal: Negative.   ?Genitourinary: Negative.   ?Musculoskeletal: Negative.   ?Skin: Negative.   ?Neurological: Negative.   ?Psychiatric/Behavioral: Negative.    ?All other systems reviewed and are negative. ? ?  ?Objective:  ?  ? ?BP 112/80   Pulse 65   Temp 98.5 ?F (36.9 ?C) (Temporal)   Resp 18   Ht 5\' 4"  (1.626 m)   Wt 169 lb (76.7 kg)   SpO2 99%   BMI 29.01 kg/m?  ? ?Wt Readings from Last 3 Encounters:  ?03/25/22 169 lb (76.7 kg)  ?10/11/21 169 lb (76.7 kg)  ?  06/07/21 174 lb (78.9 kg)  ? ?Physical Exam ?Constitutional:   ?   General: He is not in acute distress. ?   Appearance: Normal appearance. He is normal weight. He is not ill-appearing, toxic-appearing or diaphoretic.  ?Neck:  ?   Vascular: No carotid bruit.  ?   Comments: Question of L thyroid mass.  ?Cardiovascular:  ?   Rate and Rhythm: Normal rate and regular rhythm.  ?   Heart sounds: Normal heart sounds. No murmur heard. ?  No friction rub. No gallop.  ?Pulmonary:  ?   Effort: Pulmonary effort is normal. No respiratory distress.  ?   Breath sounds: Normal breath sounds. No stridor. No wheezing, rhonchi or rales.  ?Chest:  ?   Chest wall: No tenderness.  ?Musculoskeletal:  ?   Cervical back: Normal range of motion  and neck supple. No rigidity or tenderness.  ?Lymphadenopathy:  ?   Cervical: No cervical adenopathy.  ?Neurological:  ?   General: No focal deficit present.  ?   Mental Status: He is alert and oriented to person, place, and time. Mental status is at baseline.  ?Psychiatric:     ?   Mood and Affect: Mood normal.     ?   Behavior: Behavior normal.     ?   Thought Content: Thought content normal.     ?   Judgment: Judgment normal.  ? ? ?No results found for any visits on 03/25/22. ? ? ? ?The 10-year ASCVD risk score (Arnett DK, et al., 2019) is: 4.9% ? ?  ?Assessment & Plan:  ? ?Problem List Items Addressed This Visit   ? ?  ? Other  ? RLS (restless legs syndrome)  ?  Increase therapy to gabapentin 600mg  po bid prn. ?Reviewed risks, benefits, and side effects, pt voices understanding. ? ?  ?  ? Relevant Medications  ? gabapentin (NEURONTIN) 600 MG tablet  ? ?Other Visit Diagnoses   ? ? Thyroid nodule    -  Primary  ? Relevant Orders  ? THYROID  ? Thyroid Panel With TSH  ? ?  ? ? ?Meds ordered this encounter  ?Medications  ? gabapentin (NEURONTIN) 600 MG tablet  ?  Sig: Take 1 tablet (600 mg total) by mouth 2 (two) times daily.  ?  Dispense:  180 tablet  ?  Refill:  1  ?  Order Specific Question:   Supervising Provider  ?  Answer:   [place], JEFFREY R [[id number]]  ? ? ?Return in about 6 months (around 09/24/2022) for CPE and labs.  ? ?PLAN ?Thyroid 11/24/2022 ordered. Will obtain thyroid labs. Follow up as indicated. ? ? ?[place], NP ?

## 2022-03-27 ENCOUNTER — Other Ambulatory Visit: Payer: Self-pay | Admitting: Registered Nurse

## 2022-03-27 DIAGNOSIS — E039 Hypothyroidism, unspecified: Secondary | ICD-10-CM

## 2022-03-27 LAB — THYROID PANEL WITH TSH
Free Thyroxine Index: 5.6 — ABNORMAL HIGH (ref 1.4–3.8)
T3 Uptake: 36 % — ABNORMAL HIGH (ref 22–35)
T4, Total: 15.5 ug/dL — ABNORMAL HIGH (ref 4.9–10.5)
TSH: 0.06 mIU/L — ABNORMAL LOW (ref 0.40–4.50)

## 2022-03-27 MED ORDER — LEVOTHYROXINE SODIUM 150 MCG PO TABS: 150.0000 ug | ORAL_TABLET | Freq: Every day | ORAL | 0 refills | Status: DC

## 2022-03-28 ENCOUNTER — Ambulatory Visit
Admission: RE | Admit: 2022-03-28 | Discharge: 2022-03-28 | Disposition: A | Discharge status: Home / Self Care | Payer: BC Managed Care – PPO | Source: Ambulatory Visit | Attending: Registered Nurse | Admitting: Registered Nurse

## 2022-03-28 ENCOUNTER — Other Ambulatory Visit: Payer: Self-pay | Admitting: Registered Nurse

## 2022-03-28 DIAGNOSIS — E041 Nontoxic single thyroid nodule: Secondary | ICD-10-CM

## 2022-03-28 DIAGNOSIS — E042 Nontoxic multinodular goiter: Secondary | ICD-10-CM

## 2022-03-29 ENCOUNTER — Other Ambulatory Visit: Payer: Self-pay | Admitting: Registered Nurse

## 2022-03-29 DIAGNOSIS — K219 Gastro-esophageal reflux disease without esophagitis: Secondary | ICD-10-CM

## 2022-04-03 NOTE — Telephone Encounter (Signed)
Patient states he is not going to the specialist ?

## 2022-04-11 ENCOUNTER — Other Ambulatory Visit: Payer: BC Managed Care – PPO

## 2022-04-25 ENCOUNTER — Encounter: Payer: BC Managed Care – PPO | Admitting: Internal Medicine

## 2022-05-02 ENCOUNTER — Other Ambulatory Visit: Payer: BC Managed Care – PPO

## 2022-05-07 ENCOUNTER — Telehealth: Payer: Self-pay

## 2022-05-07 NOTE — Telephone Encounter (Signed)
Given to Inis Sizer to review and will be scanned once reviewed.

## 2022-05-08 ENCOUNTER — Other Ambulatory Visit: Payer: Self-pay | Admitting: Surgery

## 2022-05-08 DIAGNOSIS — E041 Nontoxic single thyroid nodule: Secondary | ICD-10-CM

## 2022-05-09 ENCOUNTER — Other Ambulatory Visit: Payer: BC Managed Care – PPO

## 2022-05-09 ENCOUNTER — Other Ambulatory Visit: Payer: Self-pay

## 2022-05-09 DIAGNOSIS — B20 Human immunodeficiency virus [HIV] disease: Secondary | ICD-10-CM

## 2022-05-13 LAB — HIV-1 RNA QUANT-NO REFLEX-BLD
HIV 1 RNA Quant: 35 copies/mL — ABNORMAL HIGH
HIV-1 RNA Quant, Log: 1.54 Log copies/mL — ABNORMAL HIGH

## 2022-05-13 LAB — T-HELPER CELLS (CD4) COUNT (NOT AT ARMC)
Absolute CD4: 1281 cells/uL (ref 490–1740)
CD4 T Helper %: 24 % — ABNORMAL LOW (ref 30–61)
Total lymphocyte count: 5291 cells/uL — ABNORMAL HIGH (ref 850–3900)

## 2022-05-26 ENCOUNTER — Ambulatory Visit
Admission: RE | Admit: 2022-05-26 | Discharge: 2022-05-26 | Disposition: A | Discharge status: Home / Self Care | Payer: BC Managed Care – PPO | Source: Ambulatory Visit | Attending: Surgery | Admitting: Surgery

## 2022-05-26 ENCOUNTER — Other Ambulatory Visit (HOSPITAL_COMMUNITY)
Admission: RE | Admit: 2022-05-26 | Discharge: 2022-05-26 | Disposition: A | Discharge status: Home / Self Care | Payer: BC Managed Care – PPO | Source: Ambulatory Visit | Attending: Surgery | Admitting: Surgery

## 2022-05-26 DIAGNOSIS — E041 Nontoxic single thyroid nodule: Secondary | ICD-10-CM

## 2022-05-27 ENCOUNTER — Encounter: Payer: Self-pay | Admitting: Internal Medicine

## 2022-05-27 ENCOUNTER — Other Ambulatory Visit: Payer: Self-pay

## 2022-05-27 ENCOUNTER — Ambulatory Visit: Payer: BC Managed Care – PPO | Admitting: Internal Medicine

## 2022-05-27 VITALS — BP 145/87 | HR 66 | Temp 97.8°F | Resp 16 | Wt 174.0 lb

## 2022-05-27 DIAGNOSIS — B20 Human immunodeficiency virus [HIV] disease: Secondary | ICD-10-CM | POA: Diagnosis not present

## 2022-05-27 DIAGNOSIS — Z113 Encounter for screening for infections with a predominantly sexual mode of transmission: Secondary | ICD-10-CM

## 2022-05-27 LAB — CYTOLOGY - NON PAP

## 2022-05-27 MED ORDER — BIKTARVY 50-200-25 MG PO TABS
ORAL_TABLET | ORAL | 11 refills | Status: DC
Start: 2022-05-27 — End: 2022-12-02

## 2022-05-27 NOTE — Assessment & Plan Note (Signed)
He is doing well on Biktarvy and results discussed.  No changes and he can follow up yearly.

## 2022-05-27 NOTE — Progress Notes (Signed)
   Subjective:    Patient ID: Drew Jenkins, male    DOB: 11-Sep-1966, 56 y.o.   MRN: [id number]  HPI Here for follow up of HIV He continues on Biktarvy with no missed doses.  No issues with getting, taking or tolerating the medication.     Review of Systems  Constitutional:  Negative for fatigue.  Gastrointestinal:  Negative for diarrhea and nausea.  Skin:  Negative for rash.       Objective:   Physical Exam Eyes:     General: No scleral icterus. Pulmonary:     Effort: Pulmonary effort is normal.  Neurological:     Mental Status: He is alert.  Psychiatric:        Mood and Affect: Mood normal.           Assessment & Plan:

## 2022-05-27 NOTE — Assessment & Plan Note (Signed)
He denies any sexual activity.

## 2022-05-28 NOTE — Progress Notes (Signed)
Good news!  Both nodules that were biopsied were benign.  No need for surgery at this time.  Will plan to see patient back in office in 1 year with a repeat USN and TSH level.  Claiborne Billings - please arrange.  [place], MD [facility] [place] practice Office: [phone]

## 2022-05-28 NOTE — Progress Notes (Signed)
Good news!  Both nodules that were biopsied were benign.  No need for surgery at this time.  Will plan to see patient back in office in 1 year with a repeat USN and TSH level.  Kelly - please arrange.  tmg  Jorden Mahl, MD [facility] practice Office: [phone]  

## 2022-06-12 ENCOUNTER — Other Ambulatory Visit: Payer: Self-pay | Admitting: Registered Nurse

## 2022-06-12 DIAGNOSIS — E039 Hypothyroidism, unspecified: Secondary | ICD-10-CM

## 2022-06-12 MED ORDER — LEVOTHYROXINE SODIUM 150 MCG PO TABS: 150.0000 ug | ORAL_TABLET | Freq: Every day | ORAL | 0 refills | Status: DC

## 2022-07-27 ENCOUNTER — Telehealth: Payer: Self-pay | Admitting: Urgent Care

## 2022-07-27 DIAGNOSIS — J069 Acute upper respiratory infection, unspecified: Secondary | ICD-10-CM

## 2022-07-27 MED ORDER — FLUTICASONE PROPIONATE 50 MCG/ACT NA SUSP: 2.0000 | Freq: Every day | NASAL | 6 refills | Status: DC

## 2022-07-27 MED ORDER — CETIRIZINE HCL 10 MG PO TABS: 10.0000 mg | ORAL_TABLET | Freq: Every day | ORAL | 0 refills | Status: DC

## 2022-07-27 NOTE — Progress Notes (Signed)
E-Visit for Upper Respiratory Infection   We are sorry you are not feeling well.  Here is how we plan to help!  Based on what you have shared with me, it looks like you may have a viral upper respiratory infection.  Upper respiratory infections are caused by a large number of viruses; however, rhinovirus is the most common cause.   Symptoms vary from person to person, with common symptoms including sore throat, cough, fatigue or lack of energy and feeling of general discomfort.  A low-grade fever of up to 100.4 may present, but is often uncommon.  Symptoms vary however, and are closely related to a person's age or underlying illnesses.  The most common symptoms associated with an upper respiratory infection are nasal discharge or congestion, cough, sneezing, headache and pressure in the ears and face.  These symptoms usually persist for about 3 to 10 days, but can last up to 2 weeks.  It is important to know that upper respiratory infections do not cause serious illness or complications in most cases.    Upper respiratory infections can be transmitted from person to person, with the most common method of transmission being a person's hands.  The virus is able to live on the skin and can infect other persons for up to 2 hours after direct contact.  Also, these can be transmitted when someone coughs or sneezes; thus, it is important to cover the mouth to reduce this risk.  To keep the spread of the illness at bay, good hand hygiene is very important.  This is an infection that is most likely caused by a virus. WE ARE SEEING A LOT OF COVID CASES RECENTLY. PLEASE PERFORM A HOME COVID TEST AS YOU WOULD BE A CANDIDATE FOR ANTIVIRAL THERAPY IF POSITIVE. There are no specific treatments other than to help you with the symptoms until the infection runs its course.  We are sorry you are not feeling well.  Here is how we plan to help!   For nasal congestion, you may use an oral decongestants such as Mucinex D or  if you have glaucoma or high blood pressure use plain Mucinex.  Saline nasal spray or nasal drops can help and can safely be used as often as needed for congestion.  For your congestion, I have prescribed Fluticasone nasal spray one spray in each nostril twice a day. I have also sent in oral zyrtec. Take one tab by mouth daily as needed.  If you do not have a history of heart disease, hypertension, diabetes or thyroid disease, prostate/bladder issues or glaucoma, you may also use Sudafed to treat nasal congestion.  It is highly recommended that you consult with a pharmacist or your primary care physician to ensure this medication is safe for you to take.     If you have a cough, you may use cough suppressants such as Delsym and Robitussin.  If you have glaucoma or high blood pressure, you can also use Coricidin HBP.    If you have a sore or scratchy throat, use a saltwater gargle-  to  teaspoon of salt dissolved in a 4-ounce to 8-ounce glass of warm water.  Gargle the solution for approximately 15-30 seconds and then spit.  It is important not to swallow the solution.  You can also use throat lozenges/cough drops and Chloraseptic spray to help with throat pain or discomfort.  Warm or cold liquids can also be helpful in relieving throat pain.  For headache, pain or general discomfort,  you can use Ibuprofen or Tylenol as directed.   Some authorities believe that zinc sprays or the use of Echinacea may shorten the course of your symptoms.   HOME CARE Only take medications as instructed by your medical team. Be sure to drink plenty of fluids. Water is fine as well as fruit juices, sodas and electrolyte beverages. You may want to stay away from caffeine or alcohol. If you are nauseated, try taking small sips of liquids. How do you know if you are getting enough fluid? Your urine should be a pale yellow or almost colorless. Get rest. Taking a steamy shower or using a humidifier may help nasal congestion  and ease sore throat pain. You can place a towel over your head and breathe in the steam from hot water coming from a faucet. Using a saline nasal spray works much the same way. Cough drops, hard candies and sore throat lozenges may ease your cough. Avoid close contacts especially the very young and the elderly Cover your mouth if you cough or sneeze Always remember to wash your hands.   GET HELP RIGHT AWAY IF: You develop worsening fever. If your symptoms do not improve within 10 days You develop yellow or green discharge from your nose over 3 days. You have coughing fits You develop a severe head ache or visual changes. You develop shortness of breath, difficulty breathing or start having chest pain Your symptoms persist after you have completed your treatment plan  MAKE SURE YOU  Understand these instructions. Will watch your condition. Will get help right away if you are not doing well or get worse.  Thank you for choosing an e-visit.  Your e-visit answers were reviewed by a board certified advanced clinical practitioner to complete your personal care plan. Depending upon the condition, your plan could have included both over the counter or prescription medications.  Please review your pharmacy choice. Make sure the pharmacy is open so you can pick up prescription now. If there is a problem, you may contact your provider through [employer] and have the prescription routed to another pharmacy.  Your safety is important to [place]. If you have drug allergies check your prescription carefully.   For the next 24 hours you can use MyChart to ask questions about today's visit, request a non-urgent call back, or ask for a work or school excuse. You will get an email in the next two days asking about your experience. I hope that your e-visit has been valuable and will speed your recovery.  I have spent 5 minutes in review of e-visit questionnaire, review and updating patient chart,  medical decision making and response to patient.   Jaonna Word L Marrissa Dai, PA

## 2022-08-18 ENCOUNTER — Emergency Department (HOSPITAL_BASED_OUTPATIENT_CLINIC_OR_DEPARTMENT_OTHER)
Admission: EM | Admit: 2022-08-18 | Discharge: 2022-08-18 | Disposition: A | Discharge status: Home / Self Care | Payer: BC Managed Care – PPO | Attending: Emergency Medicine | Admitting: Emergency Medicine

## 2022-08-18 ENCOUNTER — Emergency Department (HOSPITAL_BASED_OUTPATIENT_CLINIC_OR_DEPARTMENT_OTHER): Payer: BC Managed Care – PPO

## 2022-08-18 ENCOUNTER — Encounter (HOSPITAL_BASED_OUTPATIENT_CLINIC_OR_DEPARTMENT_OTHER): Payer: Self-pay | Admitting: Emergency Medicine

## 2022-08-18 ENCOUNTER — Other Ambulatory Visit: Payer: Self-pay

## 2022-08-18 DIAGNOSIS — R0602 Shortness of breath: Secondary | ICD-10-CM | POA: Diagnosis not present

## 2022-08-18 DIAGNOSIS — R519 Headache, unspecified: Secondary | ICD-10-CM | POA: Diagnosis not present

## 2022-08-18 DIAGNOSIS — Z21 Asymptomatic human immunodeficiency virus [HIV] infection status: Secondary | ICD-10-CM | POA: Insufficient documentation

## 2022-08-18 DIAGNOSIS — R42 Dizziness and giddiness: Secondary | ICD-10-CM | POA: Insufficient documentation

## 2022-08-18 LAB — CBC WITH DIFFERENTIAL/PLATELET
Abs Immature Granulocytes: 0.01 10*3/uL (ref 0.00–0.07)
Basophils Absolute: 0 10*3/uL (ref 0.0–0.1)
Basophils Relative: 0 %
Eosinophils Absolute: 0.4 10*3/uL (ref 0.0–0.5)
Eosinophils Relative: 5 %
HCT: 39.4 % (ref 39.0–52.0)
Hemoglobin: 14.3 g/dL (ref 13.0–17.0)
Immature Granulocytes: 0 %
Lymphocytes Relative: 53 %
Lymphs Abs: 3.8 10*3/uL (ref 0.7–4.0)
MCH: 31 pg (ref 26.0–34.0)
MCHC: 36.3 g/dL — ABNORMAL HIGH (ref 30.0–36.0)
MCV: 85.5 fL (ref 80.0–100.0)
Monocytes Absolute: 0.7 10*3/uL (ref 0.1–1.0)
Monocytes Relative: 9 %
Neutro Abs: 2.5 10*3/uL (ref 1.7–7.7)
Neutrophils Relative %: 33 %
Platelets: 301 10*3/uL (ref 150–400)
RBC: 4.61 MIL/uL (ref 4.22–5.81)
RDW: 11.7 % (ref 11.5–15.5)
WBC: 7.4 10*3/uL (ref 4.0–10.5)
nRBC: 0 % (ref 0.0–0.2)

## 2022-08-18 LAB — COMPREHENSIVE METABOLIC PANEL
ALT: 28 U/L (ref 0–44)
AST: 28 U/L (ref 15–41)
Albumin: 3.8 g/dL (ref 3.5–5.0)
Alkaline Phosphatase: 59 U/L (ref 38–126)
Anion gap: 5 (ref 5–15)
BUN: 10 mg/dL (ref 6–20)
CO2: 25 mmol/L (ref 22–32)
Calcium: 8.9 mg/dL (ref 8.9–10.3)
Chloride: 107 mmol/L (ref 98–111)
Creatinine, Ser: 0.97 mg/dL (ref 0.61–1.24)
GFR, Estimated: >60 mL/min (ref >60)
Glucose, Bld: 99 mg/dL (ref 70–99)
Potassium: 3.8 mmol/L (ref 3.5–5.1)
Sodium: 137 mmol/L (ref 135–145)
Total Bilirubin: 1.1 mg/dL (ref 0.3–1.2)
Total Protein: 7.2 g/dL (ref 6.5–8.1)

## 2022-08-18 NOTE — ED Notes (Signed)
Laid flat for orthostatics

## 2022-08-18 NOTE — ED Notes (Signed)
ED Provider at bedside. 

## 2022-08-18 NOTE — ED Provider Notes (Signed)
[facility] EMERGENCY DEPARTMENT Provider Note   CSN: [id number] Arrival date & time: 08/18/22  1153     History  No chief complaint on file.   Drew Jenkins is a 56 y.o. male.  Patient with history of HIV presents today with complaints of lightheadedness and shortness of breath.  He states that his shortness of breath has been ongoing and unchanged for the past several months states that over around 1 week ago he noticed that he was slightly more unsteady on his feet and lightheaded when he stood up.  He states that he has been having intermittent headaches as well for the past few months that are worse when he lies down.  States that his headaches are located mostly on the top of his head.  Denies any vision changes, dizziness, syncope.  Also denies fevers, chills, chest pain, nausea, vomiting, or diarrhea.  States that he is compliant with his Biktarvy for his HIV without any missed doses.  He routinely sees infectious disease for this.  Chart review does reveal a subtherapeutic CD4 count.  The history is provided by the patient. No language interpreter was used.       Home Medications Prior to Admission medications   Medication Sig Start Date End Date Taking? Authorizing Provider  albuterol (VENTOLIN HFA) 108 (90 Base) MCG/ACT inhaler SMARTSIG:2 Puff(s) By Mouth Every 4 Hours PRN 09/19/20   [provider]  bictegravir-emtricitabine-tenofovir AF (BIKTARVY) 50-200-25 MG TABS tablet TAKE 1 TABLET BY MOUTH 1 TIME A DAY. 05/27/22   Comer, Okey Regal, MD  cetirizine (ZYRTEC ALLERGY) 10 MG tablet Take 1 tablet (10 mg total) by mouth daily. 07/27/22 08/26/22  Crain, Whitney L, PA  FLUoxetine (PROZAC) 40 MG capsule TAKE 1 CAPSULE (40 MG TOTAL) BY MOUTH DAILY. 03/10/22   Maximiano Coss, NP  fluticasone (FLONASE) 50 MCG/ACT nasal spray Place 2 sprays into both nostrils daily. 07/27/22   Crain, Whitney L, PA  gabapentin (NEURONTIN) 600 MG tablet Take 1 tablet (600 mg total) by  mouth 2 (two) times daily. 03/25/22   Maximiano Coss, NP  levothyroxine (SYNTHROID) 150 MCG tablet Take 1 tablet (150 mcg total) by mouth daily before breakfast. 06/12/22   Maximiano Coss, NP  meclizine (ANTIVERT) 25 MG tablet Take 1 tablet (25 mg total) by mouth 3 (three) times daily as needed for dizziness. 03/08/21   Maximiano Coss, NP  metaxalone (SKELAXIN) 800 MG tablet TAKE 0.5 TO 1 TABLETS BY MOUTH 3 (THREE) TIMES DAILY AS NEEDED FOR MUSCLE SPASMS. 08/08/21   Wendie Agreste, MD  Multiple Vitamin (MULTIVITAMIN) tablet Take 1 tablet by mouth daily.    [provider]  pantoprazole (PROTONIX) 20 MG tablet TAKE 1 TABLET BY MOUTH EVERY DAY 03/31/22   Maximiano Coss, NP  vitamin B-12 (CYANOCOBALAMIN) 1000 MCG tablet Take 1,000 mcg by mouth daily.    [provider]  VITAMIN D PO Take by mouth daily.    [provider]      Allergies    Oxycodone    Review of Systems   Review of Systems  Neurological:  Positive for light-headedness and headaches.  All other systems reviewed and are negative.   Physical Exam Updated Vital Signs BP 124/70   Pulse 65   Temp 98.3 F (36.8 C) (Oral)   Resp 20   SpO2 98%  Physical Exam Vitals and nursing note reviewed.  Constitutional:      General: He is not in acute distress.    Appearance:  Normal appearance. He is normal weight. He is not ill-appearing, toxic-appearing or diaphoretic.  HENT:     Head: Normocephalic and atraumatic.  Eyes:     Extraocular Movements: Extraocular movements intact.     Pupils: Pupils are equal, round, and reactive to light.  Cardiovascular:     Rate and Rhythm: Normal rate and regular rhythm.     Pulses: Normal pulses.     Heart sounds: Normal heart sounds.  Pulmonary:     Effort: Pulmonary effort is normal. No respiratory distress.     Breath sounds: Normal breath sounds.  Abdominal:     General: Abdomen is flat.     Palpations: Abdomen is soft.  Musculoskeletal:        General:  Normal range of motion.     Cervical back: Normal range of motion and neck supple.     Right lower leg: No edema.     Left lower leg: No edema.  Skin:    General: Skin is warm and dry.  Neurological:     General: No focal deficit present.     Mental Status: He is alert and oriented to person, place, and time.     GCS: GCS eye subscore is 4. GCS verbal subscore is 5. GCS motor subscore is 6.     Sensory: Sensation is intact.     Motor: Motor function is intact.     Coordination: Coordination is intact.     Gait: Gait is intact.     Comments: Alert and oriented to self, place, time and event.    Speech is fluent, clear without dysarthria or dysphasia.    Strength 5/5 in upper/lower extremities   Sensation intact in upper/lower extremities    CN I not tested  CN Jenkins grossly intact visual fields bilaterally. Did not visualize posterior eye.  CN III, IV, VI PERRLA and EOMs intact bilaterally  CN V Intact sensation to sharp and light touch to the face  CN VII facial movements symmetric  CN VIII not tested  CN IX, X no uvula deviation, symmetric rise of soft palate  CN XI 5/5 SCM and trapezius strength bilaterally  CN XII Midline tongue protrusion, symmetric L/R movements   Psychiatric:        Mood and Affect: Mood normal.        Behavior: Behavior normal.     ED Results / Procedures / Treatments   Labs (all labs ordered are listed, but only abnormal results are displayed) Labs Reviewed  CBC WITH DIFFERENTIAL/PLATELET - Abnormal; Notable for the following components:      Result Value   MCHC 36.3 (*)    All other components within normal limits  COMPREHENSIVE METABOLIC PANEL  URINALYSIS, ROUTINE W REFLEX MICROSCOPIC    EKG EKG Interpretation  Date/Time:  Monday August 18 2022 12:09:32 EDT Ventricular Rate:  61 PR Interval:  158 QRS Duration: 111 QT Interval:  399 QTC Calculation: 402 R Axis:   57 Text Interpretation: Sinus rhythm Abnormal R-wave progression, early  transition Confirmed by Alvino Blood ([id number]) on 08/18/2022 2:00:23 PM  Radiology CT Head Wo Contrast  Result Date: 08/18/2022 CLINICAL DATA:  Headache, dizziness/lightheadedness, and near syncope. EXAM: CT HEAD WITHOUT CONTRAST TECHNIQUE: Contiguous axial images were obtained from the base of the skull through the vertex without intravenous contrast. RADIATION DOSE REDUCTION: This exam was performed according to the departmental dose-optimization program which includes automated exposure control, adjustment of the mA and/or kV according to patient size and/or use  of iterative reconstruction technique. COMPARISON:  None Available. FINDINGS: Brain: There is no evidence of an acute infarct, intracranial hemorrhage, mass, midline shift, or extra-axial fluid collection. The ventricles and sulci are normal. Vascular: Mild calcific atherosclerosis at the skull base. No hyperdense vessel. Skull: No fracture or suspicious osseous lesion. Sinuses/Orbits: Mild mucosal thickening in the included paranasal sinuses. Clear mastoid air cells. Unremarkable included orbits. Other: None. IMPRESSION: Unremarkable CT appearance of the brain. Electronically Signed   By: Logan Bores M.D.   On: 08/18/2022 13:27   DG Chest 2 View  Result Date: 08/18/2022 CLINICAL DATA:  Shortness of breath. EXAM: CHEST - 2 VIEW COMPARISON:  November 21, 2019. FINDINGS: Stable cardiomediastinal silhouette. Both lungs are clear. The visualized skeletal structures are unremarkable. IMPRESSION: No active cardiopulmonary disease. Electronically Signed   By: Marijo Conception M.D.   On: 08/18/2022 13:25    Procedures Procedures    Medications Ordered in ED Medications - No data to display  ED Course/ Medical Decision Making/ A&P                           Medical Decision Making Amount and/or Complexity of Data Reviewed Labs: ordered. Radiology: ordered.   This patient presents to the ED for concern of lightheadedness and shortness of  breath, this involves an extensive number of treatment options, and is a complaint that carries with it a high risk of complications and morbidity.   Co morbidities that complicate the patient evaluation  Hx HIV   Additional history obtained:  Additional history obtained from epic chart review   Lab Tests:  I Ordered, and personally interpreted labs.  The pertinent results include:  no acute laboratory findings   Imaging Studies ordered:  I ordered imaging studies including CT head and CXR  I independently visualized and interpreted imaging which showed  CT: no acute findings CXR: no acute findings I agree with the radiologist interpretation   Cardiac Monitoring: / EKG:  The patient was maintained on a cardiac monitor.  I personally viewed and interpreted the cardiac monitored which showed an underlying rhythm of: sinus rhythm   Test / Admission - Considered:  Patient presents today with complaints of shortness of breath and lightheadedness.  He is afebrile, nontoxic-appearing, and in no acute distress with reassuring vital signs.  He is also alert and oriented and neurologically intact without focal deficits.  Orthostatic vital signs performed and are unremarkable.  He is also able to walk without any ataxia or dysmetria.  His gait is normal without assistance and he does not desat on room air throughout ambulation.  Of note, patient is a extremely poor historian and unable to tell me any specific details about how long his symptoms have been ongoing for.  He also has extreme difficulty quantifying his symptoms to me.  His work-up is overall reassuring for acute findings and given that he has no appreciable deficits, transfer for MRI is not indicated at this time.  Unclear etiology for his symptoms, however low suspicion for ACS, pneumonia, neoplasm emergent concerns at this time.  It does seem that these may be chronic problems and therefore I strongly recommend the patient  follow-up with his primary care doctor for continued evaluation and management of his symptoms.  Patient is understanding and amenable with plan, educated on red flag symptoms that would prompt immediate return.  Discharged in stable condition.   This is a shared visit with supervising physician  Dr. Suezanne Jacquet who has independently evaluated patient & provided guidance in evaluation/management/disposition, in agreement with care    Final Clinical Impression(s) / ED Diagnoses Final diagnoses:  Lightheadedness    Rx / DC Orders ED Discharge Orders     None     An After Visit Summary was printed and given to the patient.     Vear Clock 08/18/22 1446    Lonell Grandchild, MD 08/19/22 2000

## 2022-08-18 NOTE — ED Triage Notes (Signed)
Patient presents to ED via POV from home. Here with shortness of breath and lightheadedness x 1 week. Ambulatory with steady gait.

## 2022-08-18 NOTE — Discharge Instructions (Addendum)
Your work-up in the ER today was reassuring for acute findings.  X-ray, CT, and laboratory evaluation were unremarkable for emergent concerns.  I strongly recommend that you follow-up with your primary care doctor for continued evaluation and management of your symptoms.  Return if development of any new or worsening symptoms.

## 2022-08-18 NOTE — ED Notes (Addendum)
Presents with shortness of breath, unable to provide actual onset. States this issue has been going on for the past few months, also c/o dizziness. Does not recall if dizziness was an immediate onset or gradual onset. Sometimes feels like he is going to pass out. Having HA at times. Points to "the top of my head" when asked where pain is.

## 2022-09-02 ENCOUNTER — Encounter: Payer: Self-pay | Admitting: Family Medicine

## 2022-09-02 ENCOUNTER — Ambulatory Visit: Payer: BC Managed Care – PPO | Admitting: Family Medicine

## 2022-09-02 VITALS — BP 128/86 | HR 78 | Temp 97.8°F | Ht 64.5 in | Wt 170.4 lb

## 2022-09-02 DIAGNOSIS — R42 Dizziness and giddiness: Secondary | ICD-10-CM | POA: Diagnosis not present

## 2022-09-02 DIAGNOSIS — R519 Headache, unspecified: Secondary | ICD-10-CM

## 2022-09-02 DIAGNOSIS — H9193 Unspecified hearing loss, bilateral: Secondary | ICD-10-CM

## 2022-09-02 MED ORDER — PSEUDOEPHEDRINE HCL ER 120 MG PO TB12: 120.0000 mg | ORAL_TABLET | Freq: Two times a day (BID) | ORAL | 0 refills | Status: AC

## 2022-09-02 MED ORDER — PREDNISONE 50 MG PO TABS: 50.0000 mg | ORAL_TABLET | Freq: Every day | ORAL | 0 refills | Status: AC

## 2022-09-02 NOTE — Progress Notes (Signed)
Assessment/Plan:   Problem List Items Addressed This Visit       Other   Vertigo - Primary    Ongoing this is over the past few months with prodromal viral/otitis externa symptoms.  Patient has ongoing headache, hearing loss, dizziness. also symptoms of chronic hearing loss without endorsed evaluation for at ENT  Work-up in ED was negative for stroke or acute cardiac changes, electrolyte, renal function, CBC all within normal limits Possibly some type of labyrinthitis from recent viral infection or possible residual swelling in the otic canal that I cannot visualize today, cannot rule out Mnire's or other vestibular disorders Trial of prednisone and pseudoephedrine to try and reduce any remaining edema or fluid in both canals Continue meclizine as needed Referral to ENT for further evaluation      Relevant Medications   predniSONE (DELTASONE) 50 MG tablet   pseudoephedrine (SUDAFED 12 HOUR) 120 MG 12 hr tablet   Other Relevant Orders   Ambulatory referral to ENT   Other Visit Diagnoses     Bilateral hearing loss, unspecified hearing loss type       Nonintractable headache, unspecified chronicity pattern, unspecified headache type              Subjective:  HPI:  Drew Jenkins is a 56 y.o. male who has Human immunodeficiency virus I infection ([place]); Hypothyroidism, adult; RLS (restless legs syndrome); H/O syphilis; Overweight (BMI 25.0-29.9); Abnormal anal Papanicolaou smear; Lumbago; Depression; GERD (gastroesophageal reflux disease); Screening examination for venereal disease; Encounter for long-term (current) use of high-risk medication; Medication monitoring encounter; General weakness; and Vertigo on their problem list..   He  has a past medical history of HIV infection ([place]).Marland Kitchen   He presents with chief complaint of Establish Care (ED F/u. Patient states that the lightheadness comes and goes. ) .   Dizziness  He reports new onset dizziness. He describes it as  feeling light headed, occurs intermittently, and typically lasts several minutes.  It typically occurs when he is lying still, sitting up from lying position, turning head from side to side, and standing up from siting position. It is usually relieved by sitting still, holding head still, taking deep breaths, rest, and medications such as meclizine. He has not started new medications around the time the dizziness started.  Patient with chronic bilateral hearing loss.  States that he does not know why.  Says he is not sure if he seen ENT in the past.  He worn his hearing aids that he wears now at a giveaway.  They do help with hearing.  Patient also endorses having symptoms of tinnitus and headache.  Denies any blurry vision, nausea, vomiting, chest pain, shortness of breath, palpitations.  About 1 month ago, patient did have symptoms of URI that evolved to bilateral otic symptoms including bilateral otalgia and purulent drainage on the left.  He was treated with ciprofloxacin drops and nasal steroids.  Vertigo symptoms are improved with meclizine, however patient cannot take these due to sedation.  URI and otalgia that resolved with treatment.  But patient did have an extreme episode of vertigo and went to the emergency department about 2 weeks ago.  During this time he had work-up including CT head noncontrast that acute abnormalities or intracranial mass, chest x-ray without acute cardiopulmonary disease, CBC and CMP also unremarkable, EKG that was sinus rhythm with mild R wave progression issues, but no concern for ST changes.  Associated symptoms: Yes hearing loss, chronic hearing loss, pt uses  hearing aids anywhere from [employer], denies that he works at the moment Yes tinnitus  No chest discomfort No heart palpitations  No heart racing No numbness or tingling of extremities  No nausea No vomiting  No speech difficulty No visual changes   Wt Readings from Last 3 Encounters:  09/02/22 170 lb 6.4 oz  (77.3 kg)  05/27/22 174 lb (78.9 kg)  03/25/22 169 lb (76.7 kg)    BP Readings from Last 3 Encounters:  09/02/22 128/86  08/18/22 119/78  05/27/22 (!) 145/87      Lab Results  Component Value Date   WBC 7.4 08/18/2022   HGB 14.3 08/18/2022   HCT 39.4 08/18/2022   MCV 85.5 08/18/2022   PLT 301 08/18/2022   Lab Results  Component Value Date   NA 137 08/18/2022   K 3.8 08/18/2022   CO2 25 08/18/2022   BUN 10 08/18/2022   CREATININE 0.97 08/18/2022   CALCIUM 8.9 08/18/2022   GLUCOSE 99 08/18/2022     ---------------------------------------------------------------------------------------------------  History reviewed. No pertinent surgical history.  Outpatient Medications Prior to Visit  Medication Sig Dispense Refill   albuterol (VENTOLIN HFA) 108 (90 Base) MCG/ACT inhaler SMARTSIG:2 Puff(s) By Mouth Every 4 Hours PRN     bictegravir-emtricitabine-tenofovir AF (BIKTARVY) 50-200-25 MG TABS tablet TAKE 1 TABLET BY MOUTH 1 TIME A DAY. 30 tablet 11   FLUoxetine (PROZAC) 40 MG capsule TAKE 1 CAPSULE (40 MG TOTAL) BY MOUTH DAILY. 90 capsule 3   fluticasone (FLONASE) 50 MCG/ACT nasal spray Place 2 sprays into both nostrils daily. 16 g 6   gabapentin (NEURONTIN) 600 MG tablet Take 1 tablet (600 mg total) by mouth 2 (two) times daily. 180 tablet 1   levothyroxine (SYNTHROID) 150 MCG tablet Take 1 tablet (150 mcg total) by mouth daily before breakfast. 90 tablet 0   meclizine (ANTIVERT) 25 MG tablet Take 1 tablet (25 mg total) by mouth 3 (three) times daily as needed for dizziness. 30 tablet 1   Multiple Vitamin (MULTIVITAMIN) tablet Take 1 tablet by mouth daily.     pantoprazole (PROTONIX) 20 MG tablet TAKE 1 TABLET BY MOUTH EVERY DAY 90 tablet 3   vitamin B-12 (CYANOCOBALAMIN) 1000 MCG tablet Take 1,000 mcg by mouth daily.     VITAMIN D PO Take by mouth daily.     cetirizine (ZYRTEC ALLERGY) 10 MG tablet Take 1 tablet (10 mg total) by mouth daily. 30 tablet 0   metaxalone (SKELAXIN)  800 MG tablet TAKE 0.5 TO 1 TABLETS BY MOUTH 3 (THREE) TIMES DAILY AS NEEDED FOR MUSCLE SPASMS. (Patient not taking: Reported on 09/02/2022) 60 tablet 0   No facility-administered medications prior to visit.    Family History  Problem Relation Age of Onset   Diabetes Mother    Arthritis/Rheumatoid Mother    Hypertension Father     Social History   Socioeconomic History   Marital status: Divorced    Spouse name: Not on file   Number of children: Not on file   Years of education: Not on file   Highest education level: Not on file  Occupational History   Not on file  Tobacco Use   Smoking status: Never    Passive exposure: Never   Smokeless tobacco: Never  Vaping Use   Vaping Use: Never used  Substance and Sexual Activity   Alcohol use: No   Drug use: No   Sexual activity: Not Currently    Comment: pt declined condoms  Other Topics Concern  Not on file  Social History Narrative   Not on file   Social Determinants of Health   Financial Resource Strain: Not on file  Food Insecurity: Not on file  Transportation Needs: Not on file  Physical Activity: Not on file  Stress: Not on file  Social Connections: Not on file  Intimate Partner Violence: Not on file                                                                                                 Objective:  Physical Exam: BP 128/86 (BP Location: Left Arm, Patient Position: Sitting, Cuff Size: Large)   Pulse 78   Temp 97.8 F (36.6 C) (Temporal)   Ht 5' 4.5" (1.638 m)   Wt 170 lb 6.4 oz (77.3 kg)   SpO2 98%   BMI 28.80 kg/m    General: No acute distress. Awake and conversant.  Eyes: Normal conjunctiva, anicteric. Round symmetric pupils.  ENT: Hearing grossly intact. No nasal discharge.  Mild cerumen on left, bilateral tympanic membranes clear Neck: Neck is supple. No masses or thyromegaly.  Respiratory: Respirations are non-labored. No auditory wheezing.  CTA B Skin: Warm. No rashes or ulcers.  Psych:  Alert and oriented. Cooperative, Appropriate mood and affect, Normal judgment.  CV: No cyanosis or JVD, RRR, no MRG MSK: Normal ambulation. No clubbing  Neuro: Sensation and CN Jenkins-XII grossly normal.        Alesia Banda, MD, MS

## 2022-09-02 NOTE — Assessment & Plan Note (Signed)
Ongoing this is over the past few months with prodromal viral/otitis externa symptoms.  Patient has ongoing headache, hearing loss, dizziness. also symptoms of chronic hearing loss without endorsed evaluation for at ENT  Work-up in ED was negative for stroke or acute cardiac changes, electrolyte, renal function, CBC all within normal limits Possibly some type of labyrinthitis from recent viral infection or possible residual swelling in the otic canal that I cannot visualize today, cannot rule out Mnire's or other vestibular disorders Trial of prednisone and pseudoephedrine to try and reduce any remaining edema or fluid in both canals Continue meclizine as needed Referral to ENT for further evaluation

## 2022-09-02 NOTE — Patient Instructions (Signed)
Take meclizine for dizziness.  Take sudafed and prednisone for possible ear fluid We are referring to ENT

## 2022-09-09 ENCOUNTER — Other Ambulatory Visit: Payer: Self-pay

## 2022-09-09 DIAGNOSIS — E039 Hypothyroidism, unspecified: Secondary | ICD-10-CM

## 2022-09-24 ENCOUNTER — Encounter: Payer: BC Managed Care – PPO | Admitting: Registered Nurse

## 2022-09-24 ENCOUNTER — Telehealth: Payer: Self-pay | Admitting: Family Medicine

## 2022-09-24 DIAGNOSIS — E039 Hypothyroidism, unspecified: Secondary | ICD-10-CM

## 2022-09-24 MED ORDER — LEVOTHYROXINE SODIUM 150 MCG PO TABS: 150.0000 ug | ORAL_TABLET | Freq: Every day | ORAL | 1 refills | Status: DC

## 2022-09-24 NOTE — Addendum Note (Signed)
Addended by: Marchia Bond on: 09/24/2022 03:30 PM   Modules accepted: Orders

## 2022-09-24 NOTE — Telephone Encounter (Signed)
Caller Name: pt Call back phone #: [phone]   MEDICATION(S):  levothyroxine (SYNTHROID) 150 MCG tablet [[id number]]  Days of Med Remaining: he has a few days left  Has the patient contacted their pharmacy (YES/NO)? Yes, contact your PCP   Preferred Pharmacy:  [facility] Address: [address], [place], [place] [address] Phone: [phone]

## 2022-09-30 ENCOUNTER — Encounter: Payer: Self-pay | Admitting: Family Medicine

## 2022-09-30 ENCOUNTER — Ambulatory Visit: Payer: BC Managed Care – PPO | Admitting: Family Medicine

## 2022-09-30 VITALS — BP 118/72 | HR 75 | Temp 97.0°F | Ht 64.5 in | Wt 169.4 lb

## 2022-09-30 DIAGNOSIS — R42 Dizziness and giddiness: Secondary | ICD-10-CM

## 2022-09-30 DIAGNOSIS — E039 Hypothyroidism, unspecified: Secondary | ICD-10-CM | POA: Diagnosis not present

## 2022-09-30 NOTE — Patient Instructions (Signed)
We are rechecking your thyroid levels today.  We will call you with results and any medication adjustments needed

## 2022-09-30 NOTE — Assessment & Plan Note (Signed)
Recently elevated TSH, asymptomatic Recheck thyroid panel

## 2022-09-30 NOTE — Progress Notes (Signed)
Assessment/Plan:   Problem List Items Addressed This Visit       Endocrine   Hypothyroidism, adult - Primary    Recently elevated TSH, asymptomatic Recheck thyroid panel      Relevant Orders   TSH     Other   Vertigo    Vertigo improved, likely arthritis status post recent viral URI    Subjective:  HPI:  Drew Jenkins is a 56 y.o. male who has Human immunodeficiency virus I infection ([place]); Hypothyroidism, adult; RLS (restless legs syndrome); H/O syphilis; Overweight (BMI 25.0-29.9); Abnormal anal Papanicolaou smear; Lumbago; Depression; GERD (gastroesophageal reflux disease); Screening examination for venereal disease; Encounter for long-term (current) use of high-risk medication; Medication monitoring encounter; General weakness; and Vertigo on their problem list..   He  has a past medical history of HIV infection ([place]).Marland Kitchen   He presents with chief complaint of office visit (F/u Vertigo/) .  Hypothyroid, follow-up  Lab Results  Component Value Date   TSH 0.06 (L) 03/25/2022   TSH 3.350 03/08/2021   TSH 2.600 09/10/2020   FREET4 1.4 11/20/2016   T4TOTAL 15.5 (H) 03/25/2022   T4TOTAL 9.5 03/08/2021    Wt Readings from Last 3 Encounters:  09/30/22 169 lb 6.4 oz (76.8 kg)  09/02/22 170 lb 6.4 oz (77.3 kg)  05/27/22 174 lb (78.9 kg)    Patient status post thyroidectomy several years ago.  With acquired hypothyroidism.  Currently treated with levothyroxine 150 mcg. Management since that visit includes patient with recent low TSH In 03/25/2022.  Also also found to have multiple thyroid nodules on exam.  Patient with thyroid ultrasound from 03/30/2022 with multiple nodules and recommendation for FNA.  Patient was referred to Dr. Armandina Gemma with [facility] surgery with [facility].  Patient status post FNA of nodules in 05/2022.  Pathology with benign follicular adenoma.  He reports good compliance with treatment. He is not having side effects. Symptoms: No change in  energy level No constipation  No diarrhea No heat / cold intolerance  No nervousness No palpitations  No weight changes    -----------------------------------------------------------------------------------------    Dizziness Update 09/30/22 Reports complete resolution of dizziness.  Has not had any more episodes of dizziness or syncope.  09/02/2022 He reports new onset dizziness. He describes it as feeling light headed, occurs intermittently, and typically lasts several minutes.  It typically occurs when he is lying still, sitting up from lying position, turning head from side to side, and standing up from siting position. It is usually relieved by sitting still, holding head still, taking deep breaths, rest, and medications such as meclizine. He has not started new medications around the time the dizziness started.  Patient with chronic bilateral hearing loss.  States that he does not know why.  Says he is not sure if he seen ENT in the past.  He worn his hearing aids that he wears now at a giveaway.  They do help with hearing.  Patient also endorses having symptoms of tinnitus and headache.  Denies any blurry vision, nausea, vomiting, chest pain, shortness of breath, palpitations.  About 1 month ago, patient did have symptoms of URI that evolved to bilateral otic symptoms including bilateral otalgia and purulent drainage on the left.  He was treated with ciprofloxacin drops and nasal steroids.  Vertigo symptoms are improved with meclizine, however patient cannot take these due to sedation.  URI and otalgia that resolved with treatment.  But patient did have an extreme episode of vertigo and  went to the emergency department about 2 weeks ago.  During this time he had work-up including CT head noncontrast that acute abnormalities or intracranial mass, chest x-ray without acute cardiopulmonary disease, CBC and CMP also unremarkable, EKG that was sinus rhythm with mild R wave progression issues, but  no concern for ST changes.  Associated symptoms: Yes hearing loss, chronic hearing loss, pt uses hearing aids anywhere from [employer], denies that he works at the moment Yes tinnitus  No chest discomfort No heart palpitations  No heart racing No numbness or tingling of extremities  No nausea No vomiting  No speech difficulty No visual changes   Wt Readings from Last 3 Encounters:  09/30/22 169 lb 6.4 oz (76.8 kg)  09/02/22 170 lb 6.4 oz (77.3 kg)  05/27/22 174 lb (78.9 kg)    BP Readings from Last 3 Encounters:  09/30/22 118/72  09/02/22 128/86  08/18/22 119/78      Lab Results  Component Value Date   WBC 7.4 08/18/2022   HGB 14.3 08/18/2022   HCT 39.4 08/18/2022   MCV 85.5 08/18/2022   PLT 301 08/18/2022   Lab Results  Component Value Date   NA 137 08/18/2022   K 3.8 08/18/2022   CO2 25 08/18/2022   BUN 10 08/18/2022   CREATININE 0.97 08/18/2022   CALCIUM 8.9 08/18/2022   GLUCOSE 99 08/18/2022     ---------------------------------------------------------------------------------------------------  No past surgical history on file.  Outpatient Medications Prior to Visit  Medication Sig Dispense Refill   albuterol (VENTOLIN HFA) 108 (90 Base) MCG/ACT inhaler SMARTSIG:2 Puff(s) By Mouth Every 4 Hours PRN     bictegravir-emtricitabine-tenofovir AF (BIKTARVY) 50-200-25 MG TABS tablet TAKE 1 TABLET BY MOUTH 1 TIME A DAY. 30 tablet 11   FLUoxetine (PROZAC) 40 MG capsule TAKE 1 CAPSULE (40 MG TOTAL) BY MOUTH DAILY. 90 capsule 3   fluticasone (FLONASE) 50 MCG/ACT nasal spray Place 2 sprays into both nostrils daily. 16 g 6   gabapentin (NEURONTIN) 600 MG tablet Take 1 tablet (600 mg total) by mouth 2 (two) times daily. 180 tablet 1   levothyroxine (SYNTHROID) 150 MCG tablet Take 1 tablet (150 mcg total) by mouth daily before breakfast. 90 tablet 1   meclizine (ANTIVERT) 25 MG tablet Take 1 tablet (25 mg total) by mouth 3 (three) times daily as needed for dizziness. 30 tablet 1    Multiple Vitamin (MULTIVITAMIN) tablet Take 1 tablet by mouth daily.     pantoprazole (PROTONIX) 20 MG tablet TAKE 1 TABLET BY MOUTH EVERY DAY 90 tablet 3   cetirizine (ZYRTEC ALLERGY) 10 MG tablet Take 1 tablet (10 mg total) by mouth daily. 30 tablet 0   metaxalone (SKELAXIN) 800 MG tablet TAKE 0.5 TO 1 TABLETS BY MOUTH 3 (THREE) TIMES DAILY AS NEEDED FOR MUSCLE SPASMS. (Patient not taking: Reported on 09/02/2022) 60 tablet 0   vitamin B-12 (CYANOCOBALAMIN) 1000 MCG tablet Take 1,000 mcg by mouth daily. (Patient not taking: Reported on 09/30/2022)     VITAMIN D PO Take by mouth daily. (Patient not taking: Reported on 09/30/2022)     No facility-administered medications prior to visit.    Family History  Problem Relation Age of Onset   Diabetes Mother    Arthritis/Rheumatoid Mother    Hypertension Father     Social History   Socioeconomic History   Marital status: Divorced    Spouse name: Not on file   Number of children: Not on file   Years of education: Not on  file   Highest education level: Not on file  Occupational History   Not on file  Tobacco Use   Smoking status: Never    Passive exposure: Never   Smokeless tobacco: Never  Vaping Use   Vaping Use: Never used  Substance and Sexual Activity   Alcohol use: No   Drug use: No   Sexual activity: Not Currently    Comment: pt declined condoms  Other Topics Concern   Not on file  Social History Narrative   Not on file   Social Determinants of Health   Financial Resource Strain: Not on file  Food Insecurity: Not on file  Transportation Needs: Not on file  Physical Activity: Not on file  Stress: Not on file  Social Connections: Not on file  Intimate Partner Violence: Not on file                                                                                                 Objective:  Physical Exam: BP 118/72 (BP Location: Left Arm, Patient Position: Sitting, Cuff Size: Large)   Pulse 75   Temp (!) 97 F (36.1  C) (Temporal)   Ht 5' 4.5" (1.638 m)   Wt 169 lb 6.4 oz (76.8 kg)   SpO2 96%   BMI 28.63 kg/m    General: No acute distress. Awake and conversant.  Eyes: Normal conjunctiva, anicteric. Round symmetric pupils.  ENT: Hearing grossly intact. No nasal discharge.  Mild cerumen on left, bilateral tympanic membranes clear Neck: Neck is supple. No masses or thyromegaly.  Respiratory: Respirations are non-labored. No auditory wheezing.  CTA B Skin: Warm. No rashes or ulcers.  Psych: Alert and oriented. Cooperative, Appropriate mood and affect, Normal judgment.  CV: No cyanosis or JVD, RRR, no MRG MSK: Normal ambulation. No clubbing  Neuro: Sensation and CN Jenkins-XII grossly normal.        Alesia Banda, MD, MS

## 2022-10-01 LAB — TSH: TSH: 6.22 u[IU]/mL — ABNORMAL HIGH (ref 0.35–5.50)

## 2022-10-01 MED ORDER — LEVOTHYROXINE SODIUM 175 MCG PO TABS: 175.0000 ug | ORAL_TABLET | Freq: Every day | ORAL | 3 refills | Status: DC

## 2022-10-01 NOTE — Addendum Note (Signed)
Addended by: Josephine Igo B on: 10/01/2022 11:34 AM   Modules accepted: Orders

## 2022-10-09 NOTE — Addendum Note (Signed)
Addended by: Bonnita Hollow on: 10/09/2022 08:56 AM   Modules accepted: Orders

## 2022-11-18 ENCOUNTER — Other Ambulatory Visit (INDEPENDENT_AMBULATORY_CARE_PROVIDER_SITE_OTHER): Payer: BC Managed Care – PPO

## 2022-11-18 DIAGNOSIS — E039 Hypothyroidism, unspecified: Secondary | ICD-10-CM

## 2022-11-19 LAB — THYROID PANEL WITH TSH
Free Thyroxine Index: 3.2 (ref 1.4–3.8)
T3 Uptake: 33 % (ref 22–35)
T4, Total: 9.6 ug/dL (ref 4.9–10.5)
TSH: 2.56 mIU/L (ref 0.40–4.50)

## 2022-12-02 ENCOUNTER — Other Ambulatory Visit: Payer: Self-pay | Admitting: Internal Medicine

## 2022-12-02 DIAGNOSIS — B20 Human immunodeficiency virus [HIV] disease: Secondary | ICD-10-CM

## 2022-12-02 MED ORDER — BIKTARVY 50-200-25 MG PO TABS: ORAL_TABLET | ORAL | 3 refills | Status: DC

## 2022-12-27 ENCOUNTER — Other Ambulatory Visit: Payer: Self-pay | Admitting: Family Medicine

## 2022-12-27 DIAGNOSIS — R42 Dizziness and giddiness: Secondary | ICD-10-CM

## 2023-02-13 ENCOUNTER — Encounter: Payer: Self-pay | Admitting: Family Medicine

## 2023-02-16 ENCOUNTER — Encounter: Payer: Self-pay | Admitting: Family Medicine

## 2023-02-16 ENCOUNTER — Ambulatory Visit: Payer: BC Managed Care – PPO | Admitting: Family Medicine

## 2023-02-16 VITALS — Temp 97.8°F | Wt 163.0 lb

## 2023-02-16 DIAGNOSIS — R42 Dizziness and giddiness: Secondary | ICD-10-CM | POA: Diagnosis not present

## 2023-02-16 MED ORDER — MECLIZINE HCL 25 MG PO TABS: 25.0000 mg | ORAL_TABLET | Freq: Three times a day (TID) | ORAL | 1 refills | Status: DC | PRN

## 2023-02-16 NOTE — Assessment & Plan Note (Signed)
The patient reports chronic dizziness associated with movement or walking, suggesting vertigo. Previous extensive workup, including imaging and laboratory tests, yielded normal results.   Differential diagnosis:  - Benign paroxysmal positional vertigo (BPPV)  - Meniere's disease  - Vestibular migraine  - Ototoxicity (unlikely given cessation of Gabapentin a month prior)  Plan:  Prescribe vestibular exercises as treatment for potential BPPV. Refer the patient to an ENT specialist for further evaluation and management of vertigo and any potential inner ear problem. Recommend engaging in these exercises with the support of a family member if possible. Schedule a follow-up to re-evaluate symptoms and decide whether to continue with home exercises or proceed with therapy based on progress. Consider blood work assessment on a follow-up visit in 3 months for cholesterol profile and general physical.

## 2023-02-16 NOTE — Progress Notes (Signed)
Assessment/Plan:   Problem List Items Addressed This Visit       Other   Vertigo - Primary    The patient reports chronic dizziness associated with movement or walking, suggesting vertigo. Previous extensive workup, including imaging and laboratory tests, yielded normal results.   Differential diagnosis:  - Benign paroxysmal positional vertigo (BPPV)  - Meniere's disease  - Vestibular migraine  - Ototoxicity (unlikely given cessation of Gabapentin a month prior)  Plan:  Prescribe vestibular exercises as treatment for potential BPPV. Refer the patient to an ENT specialist for further evaluation and management of vertigo and any potential inner ear problem. Recommend engaging in these exercises with the support of a family member if possible. Schedule a follow-up to re-evaluate symptoms and decide whether to continue with home exercises or proceed with therapy based on progress. Consider blood work assessment on a follow-up visit in 3 months for cholesterol profile and general physical.      Relevant Orders   Ambulatory referral to ENT   Other Visit Diagnoses     Dizziness       Relevant Medications   meclizine (ANTIVERT) 25 MG tablet       Medications Discontinued During This Encounter  Medication Reason   meclizine (ANTIVERT) 25 MG tablet Reorder      Subjective:  HPI: Encounter date: 02/16/2023  Staci Eichenauer II is a 57 y.o. male who has Human immunodeficiency virus I infection ([place]); Hypothyroidism, adult; RLS (restless legs syndrome); H/O syphilis; Overweight (BMI 25.0-29.9); Abnormal anal Papanicolaou smear; Lumbago; Depression; GERD (gastroesophageal reflux disease); Screening examination for venereal disease; Encounter for long-term (current) use of high-risk medication; Medication monitoring encounter; General weakness; and Vertigo on their problem list..   He  has a past medical history of HIV infection ([place])..   CHIEF COMPLAINT: The patient presents with  ongoing dizziness.  HISTORY OF PRESENT ILLNESS:   Dizziness: The patient, a 57 year old male, reports chronic dizziness without associated hearing loss, tinnitus, chest discomfort, heart palpitations, numbness or tingling of extremities, nausea, vomiting, speech difficulty, or visual changes. His dizziness occurs with movement and has been persistent for an extended period. He has not started new medications recently that coincide with the onset of the dizziness. He mentioned previous use of Gabapentin but had to stop a month ago due to side effects.  ROS:  ENT: Reports occasional pressure in the eyes. Cardiology: Denies chest pain, palpitations, or shortness of breath. Neurological: Denies syncope, tunnel vision, or focal neurological symptoms.  Remainder of ROS negative.  No past surgical history on file.  Outpatient Medications Prior to Visit  Medication Sig Dispense Refill   albuterol (VENTOLIN HFA) 108 (90 Base) MCG/ACT inhaler SMARTSIG:2 Puff(s) By Mouth Every 4 Hours PRN     bictegravir-emtricitabine-tenofovir AF (BIKTARVY) 50-200-25 MG TABS tablet TAKE 1 TABLET BY MOUTH 1 TIME A DAY. 90 tablet 3   FLUoxetine (PROZAC) 40 MG capsule TAKE 1 CAPSULE (40 MG TOTAL) BY MOUTH DAILY. 90 capsule 3   levothyroxine (SYNTHROID) 175 MCG tablet Take 1 tablet (175 mcg total) by mouth daily. 90 tablet 3   Multiple Vitamin (MULTIVITAMIN) tablet Take 1 tablet by mouth daily.     pantoprazole (PROTONIX) 20 MG tablet TAKE 1 TABLET BY MOUTH EVERY DAY 90 tablet 3   meclizine (ANTIVERT) 25 MG tablet Take 1 tablet (25 mg total) by mouth 3 (three) times daily as needed for dizziness. 30 tablet 1   cetirizine (ZYRTEC ALLERGY) 10 MG tablet Take 1 tablet (10 mg  total) by mouth daily. 30 tablet 0   fluticasone (FLONASE) 50 MCG/ACT nasal spray Place 2 sprays into both nostrils daily. 16 g 6   gabapentin (NEURONTIN) 600 MG tablet Take 1 tablet (600 mg total) by mouth 2 (two) times daily. 180 tablet 1   metaxalone  (SKELAXIN) 800 MG tablet TAKE 0.5 TO 1 TABLETS BY MOUTH 3 (THREE) TIMES DAILY AS NEEDED FOR MUSCLE SPASMS. (Patient not taking: Reported on 09/02/2022) 60 tablet 0   vitamin B-12 (CYANOCOBALAMIN) 1000 MCG tablet Take 1,000 mcg by mouth daily. (Patient not taking: Reported on 09/30/2022)     VITAMIN D PO Take by mouth daily. (Patient not taking: Reported on 09/30/2022)     No facility-administered medications prior to visit.    Family History  Problem Relation Age of Onset   Diabetes Mother    Arthritis/Rheumatoid Mother    Hypertension Father     Social History   Socioeconomic History   Marital status: Divorced    Spouse name: Not on file   Number of children: Not on file   Years of education: Not on file   Highest education level: Not on file  Occupational History   Not on file  Tobacco Use   Smoking status: Never    Passive exposure: Never   Smokeless tobacco: Never  Vaping Use   Vaping Use: Never used  Substance and Sexual Activity   Alcohol use: No   Drug use: No   Sexual activity: Not Currently    Comment: pt declined condoms  Other Topics Concern   Not on file  Social History Narrative   Not on file   Social Determinants of Health   Financial Resource Strain: Not on file  Food Insecurity: Not on file  Transportation Needs: Not on file  Physical Activity: Not on file  Stress: Not on file  Social Connections: Not on file  Intimate Partner Violence: Not on file                                                                                                 Objective:  Physical Exam: Temp 97.8 F (36.6 C) (Temporal)   Wt 163 lb (73.9 kg)   SpO2 99%   BMI 27.55 kg/m      Lab Results  Component Value Date   TSH 2.56 11/18/2022      Latest Ref Rng & Units 08/18/2022   12:36 PM 08/26/2021   10:32 AM 03/08/2021    4:04 PM  CBC  WBC 4.0 - 10.5 K/uL 7.4  8.0  9.2   Hemoglobin 13.0 - 17.0 g/dL 14.3  15.7  16.0   Hematocrit 39.0 - 52.0 % 39.4  45.2  45.7    Platelets 150 - 400 K/uL 301  326     Lab Results  Component Value Date   NA 137 08/18/2022   K 3.8 08/18/2022   CO2 25 08/18/2022   GLUCOSE 99 08/18/2022   BUN 10 08/18/2022   CREATININE 0.97 08/18/2022   CALCIUM 8.9 08/18/2022   EGFR 97 08/26/2021   GFRNONAA >60 08/18/2022  Lab Results  Component Value Date   ALT 28 08/18/2022   AST 28 08/18/2022   ALKPHOS 59 08/18/2022   BILITOT 1.1 08/18/2022   ECG: 08/2022: Sinus rhythm Abnormal R-wave progression, early transition. Vent. rate 61 BPM PR interval 158 ms QRS duration 111 ms QT/QTcB 399/402 ms P-R-T axes 46 57 53  CT 08/2022: IMPRESSION: Unremarkable CT appearance of the brain.     Alesia Banda, MD, MS

## 2023-02-16 NOTE — Patient Instructions (Signed)
We have refilled your meclizine.  We are referring to ENT.  Also try the vestibular exercises.

## 2023-02-24 ENCOUNTER — Encounter: Payer: Self-pay | Admitting: Family Medicine

## 2023-03-23 ENCOUNTER — Other Ambulatory Visit: Payer: Self-pay | Admitting: Family Medicine

## 2023-03-23 DIAGNOSIS — F331 Major depressive disorder, recurrent, moderate: Secondary | ICD-10-CM

## 2023-03-23 NOTE — Telephone Encounter (Signed)
Chart supports rx. Last OV: 02/16/2023 Next OV: 05/19/2023

## 2023-03-30 ENCOUNTER — Telehealth: Payer: Self-pay

## 2023-03-30 NOTE — Telephone Encounter (Signed)
Left patient a voice mail to call back to schedule an appointment after a request for lab and office visit

## 2023-03-31 ENCOUNTER — Other Ambulatory Visit: Payer: Self-pay | Admitting: Nurse Practitioner

## 2023-03-31 DIAGNOSIS — E039 Hypothyroidism, unspecified: Secondary | ICD-10-CM

## 2023-04-01 ENCOUNTER — Ambulatory Visit: Payer: BC Managed Care – PPO | Admitting: Family Medicine

## 2023-04-02 MED ORDER — LEVOTHYROXINE SODIUM 175 MCG PO TABS: 175.0000 ug | ORAL_TABLET | Freq: Every day | ORAL | 3 refills | Status: DC

## 2023-04-09 ENCOUNTER — Encounter: Payer: Self-pay | Admitting: Family Medicine

## 2023-04-13 ENCOUNTER — Telehealth: Payer: Self-pay | Admitting: Family Medicine

## 2023-04-13 NOTE — Telephone Encounter (Signed)
Called pt to move appt sooner his transportation limits his ability. Will watch for a cancellation.

## 2023-04-14 ENCOUNTER — Other Ambulatory Visit: Payer: BC Managed Care – PPO

## 2023-04-14 ENCOUNTER — Other Ambulatory Visit: Payer: Self-pay

## 2023-04-14 DIAGNOSIS — B20 Human immunodeficiency virus [HIV] disease: Secondary | ICD-10-CM

## 2023-04-14 DIAGNOSIS — Z113 Encounter for screening for infections with a predominantly sexual mode of transmission: Secondary | ICD-10-CM

## 2023-04-15 LAB — T-HELPER CELL (CD4) - (RCID CLINIC ONLY)
CD4 % Helper T Cell: 30 % — ABNORMAL LOW (ref 33–65)
CD4 T Cell Abs: 1107 /uL (ref 400–1790)

## 2023-04-17 ENCOUNTER — Ambulatory Visit: Payer: BC Managed Care – PPO | Admitting: Physical Therapy

## 2023-04-17 LAB — CBC
HCT: 44.8 % (ref 38.5–50.0)
Hemoglobin: 15.5 g/dL (ref 13.2–17.1)
MCH: 30.8 pg (ref 27.0–33.0)
MCHC: 34.6 g/dL (ref 32.0–36.0)
MCV: 89.1 fL (ref 80.0–100.0)
MPV: 9.2 fL (ref 7.5–12.5)
Platelets: 341 10*3/uL (ref 140–400)
RBC: 5.03 10*6/uL (ref 4.20–5.80)
RDW: 12.5 % (ref 11.0–15.0)
WBC: 8.9 10*3/uL (ref 3.8–10.8)

## 2023-04-17 LAB — COMPLETE METABOLIC PANEL WITH GFR
AG Ratio: 1.4 (calc) (ref 1.0–2.5)
ALT: 21 U/L (ref 9–46)
AST: 23 U/L (ref 10–35)
Albumin: 4.6 g/dL (ref 3.6–5.1)
Alkaline phosphatase (APISO): 67 U/L (ref 35–144)
BUN: 14 mg/dL (ref 7–25)
CO2: 29 mmol/L (ref 20–32)
Calcium: 10 mg/dL (ref 8.6–10.3)
Chloride: 105 mmol/L (ref 98–110)
Creat: 0.98 mg/dL (ref 0.70–1.30)
Globulin: 3.2 g/dL (calc) (ref 1.9–3.7)
Glucose, Bld: 86 mg/dL (ref 65–99)
Potassium: 4.5 mmol/L (ref 3.5–5.3)
Sodium: 141 mmol/L (ref 135–146)
Total Bilirubin: 1.4 mg/dL — ABNORMAL HIGH (ref 0.2–1.2)
Total Protein: 7.8 g/dL (ref 6.1–8.1)
eGFR: 89 mL/min/{1.73_m2} (ref >=60)

## 2023-04-17 LAB — HIV-1 RNA QUANT-NO REFLEX-BLD
HIV 1 RNA Quant: 88 Copies/mL — ABNORMAL HIGH
HIV-1 RNA Quant, Log: 1.94 Log cps/mL — ABNORMAL HIGH

## 2023-04-17 LAB — RPR: RPR Ser Ql: REACTIVE — AB

## 2023-04-17 LAB — RPR TITER: RPR Titer: 1:2 {titer} — ABNORMAL HIGH

## 2023-04-17 LAB — T PALLIDUM AB: T Pallidum Abs: POSITIVE — AB

## 2023-04-27 ENCOUNTER — Encounter: Payer: Self-pay | Admitting: Family Medicine

## 2023-04-27 ENCOUNTER — Ambulatory Visit: Payer: BC Managed Care – PPO | Admitting: Family Medicine

## 2023-04-27 VITALS — BP 134/82 | HR 70 | Temp 97.1°F | Wt 166.2 lb

## 2023-04-27 DIAGNOSIS — I340 Nonrheumatic mitral (valve) insufficiency: Secondary | ICD-10-CM

## 2023-04-27 DIAGNOSIS — R0602 Shortness of breath: Secondary | ICD-10-CM

## 2023-04-27 DIAGNOSIS — R5383 Other fatigue: Secondary | ICD-10-CM

## 2023-04-27 DIAGNOSIS — E039 Hypothyroidism, unspecified: Secondary | ICD-10-CM | POA: Diagnosis not present

## 2023-04-27 DIAGNOSIS — Z9189 Other specified personal risk factors, not elsewhere classified: Secondary | ICD-10-CM

## 2023-04-27 NOTE — Progress Notes (Unsigned)
Assessment/Plan:   Problem List Items Addressed This Visit   None   There are no discontinued medications.  No follow-ups on file.    Subjective:   Encounter date: 04/27/2023  Drew Jenkins is a 57 y.o. male who has Human immunodeficiency virus I infection (HCC); Hypothyroidism, adult; RLS (restless legs syndrome); H/O syphilis; Overweight (BMI 25.0-29.9); Abnormal anal Papanicolaou smear; Lumbago; Depression; GERD (gastroesophageal reflux disease); Screening examination for venereal disease; Encounter for long-term (current) use of high-risk medication; Medication monitoring encounter; General weakness; and Vertigo on their problem list..   He  has a past medical history of HIV infection (HCC).Drew Jenkins   He presents with chief complaint of Medical Management of Chronic Issues (Thyroid concerns.) .   HPI:   Review of Systems  Constitutional:  Positive for malaise/fatigue. Negative for chills, diaphoresis, fever and weight loss.  HENT:  Negative for ear discharge, ear pain and hearing loss.   Eyes:  Negative for blurred vision, double vision, photophobia, pain, discharge and redness.  Respiratory:  Positive for shortness of breath. Negative for cough, sputum production and wheezing.   Cardiovascular:  Negative for chest pain and palpitations.  Gastrointestinal:  Negative for abdominal pain, blood in stool, constipation, diarrhea, heartburn, melena, nausea and vomiting.  Genitourinary:  Negative for dysuria, flank pain, frequency, hematuria and urgency.  Musculoskeletal:  Negative for myalgias.  Skin:  Negative for itching and rash.  Neurological:  Negative for dizziness, tingling, tremors, speech change, seizures, loss of consciousness, weakness and headaches.  Endo/Heme/Allergies:  Positive for environmental allergies.  Psychiatric/Behavioral:  Negative for depression, hallucinations, memory loss, substance abuse and suicidal ideas. The patient does not have insomnia.   All other  systems reviewed and are negative.   STOP-BANG Score for Obstructive Sleep Apnea from [email or web address]  on 04/27/2023 ** All calculations should be rechecked by clinician prior to use **  RESULT SUMMARY: 5 points STOP-BANG  High  Risk for moderate to severe OSA   INPUTS: Do you snore loudly? --> 1 = Yes Do you often feel tired, fatigued, or sleepy during the daytime? --> 1 = Yes Has anyone observed you stop breathing during sleep? --> 0 = No Do you have (or are you being treated for) high blood pressure? --> 1 = Yes BMI --> 0 = ?35 kg/m Age --> 1 = >50 years Neck circumference --> 0 = ?40 cm Gender --> 1 = Male   No past surgical history on file.  Outpatient Medications Prior to Visit  Medication Sig Dispense Refill   albuterol (VENTOLIN HFA) 108 (90 Base) MCG/ACT inhaler SMARTSIG:2 Puff(s) By Mouth Every 4 Hours PRN     bictegravir-emtricitabine-tenofovir AF (BIKTARVY) 50-200-25 MG TABS tablet TAKE 1 TABLET BY MOUTH 1 TIME A DAY. 90 tablet 3   Cyanocobalamin (VITAMIN B-12) 5000 MCG LOZG Take 1,000 tablets by mouth daily.     FLUoxetine (PROZAC) 40 MG capsule TAKE 1 CAPSULE (40 MG TOTAL) BY MOUTH DAILY. 90 capsule 2   levothyroxine (SYNTHROID) 175 MCG tablet Take 1 tablet (175 mcg total) by mouth daily. 90 tablet 3   meclizine (ANTIVERT) 25 MG tablet Take 1 tablet (25 mg total) by mouth 3 (three) times daily as needed for dizziness. 30 tablet 1   Multiple Vitamin (MULTIVITAMIN) tablet Take 1 tablet by mouth daily.     pantoprazole (PROTONIX) 20 MG tablet TAKE 1 TABLET BY MOUTH EVERY DAY 90 tablet 3   cetirizine (ZYRTEC ALLERGY) 10 MG tablet Take 1 tablet (10 mg total) by  mouth daily. 30 tablet 0   fluticasone (FLONASE) 50 MCG/ACT nasal spray Place 2 sprays into both nostrils daily. 16 g 6   gabapentin (NEURONTIN) 600 MG tablet Take 1 tablet (600 mg total) by mouth 2 (two) times daily. 180 tablet 1   metaxalone (SKELAXIN) 800 MG tablet TAKE 0.5 TO 1 TABLETS BY MOUTH 3 (THREE) TIMES  DAILY AS NEEDED FOR MUSCLE SPASMS. (Patient not taking: Reported on 09/02/2022) 60 tablet 0   VITAMIN D PO Take by mouth daily. (Patient not taking: Reported on 09/30/2022)     No facility-administered medications prior to visit.    Family History  Problem Relation Age of Onset   Diabetes Mother    Arthritis/Rheumatoid Mother    Hypertension Father     Social History   Socioeconomic History   Marital status: Divorced    Spouse name: Not on file   Number of children: Not on file   Years of education: Not on file   Highest education level: 12th grade  Occupational History   Not on file  Tobacco Use   Smoking status: Never    Passive exposure: Never   Smokeless tobacco: Never  Vaping Use   Vaping Use: Never used  Substance and Sexual Activity   Alcohol use: No   Drug use: No   Sexual activity: Not Currently    Comment: pt declined condoms  Other Topics Concern   Not on file  Social History Narrative   Not on file   Social Determinants of Health   Financial Resource Strain: Low Risk  (04/23/2023)   Overall Financial Resource Strain (CARDIA)    Difficulty of Paying Living Expenses: Not hard at all  Food Insecurity: Food Insecurity Present (04/23/2023)   Hunger Vital Sign    Worried About Running Out of Food in the Last Year: Never true    Ran Out of Food in the Last Year: Sometimes true  Transportation Needs: No Transportation Needs (04/23/2023)   PRAPARE - Administrator, Civil Service (Medical): No    Lack of Transportation (Non-Medical): No  Physical Activity: Unknown (04/23/2023)   Exercise Vital Sign    Days of Exercise per Week: 5 days    Minutes of Exercise per Session: Not on file  Stress: No Stress Concern Present (04/23/2023)   [employer] of Occupational Health - Occupational Stress Questionnaire    Feeling of Stress : Only a little  Social Connections: Moderately Isolated (04/23/2023)   Social Connection and Isolation Panel [NHANES]    Frequency  of Communication with Friends and Family: More than three times a week    Frequency of Social Gatherings with Friends and Family: Patient declined    Attends Religious Services: 1 to 4 times per year    Active Member of [employer] or Organizations: No    Attends Engineer, structural: Not on file    Marital Status: Divorced  Intimate Partner Violence: Not on file  Objective:  Physical Exam: BP 134/82 (BP Location: Left Arm, Patient Position: Sitting, Cuff Size: Large)   Pulse 70   Temp (!) 97.1 F (36.2 C) (Temporal)   Wt 166 lb 3.2 oz (75.4 kg)   SpO2 99%   BMI 28.09 kg/m     Physical Exam Constitutional:      Appearance: Normal appearance.  HENT:     Head: Normocephalic and atraumatic.     Right Ear: Hearing normal.     Left Ear: Hearing normal.     Nose: Nose normal.  Eyes:     General: No scleral icterus.       Right eye: No discharge.        Left eye: No discharge.     Extraocular Movements: Extraocular movements intact.  Cardiovascular:     Rate and Rhythm: Normal rate and regular rhythm.     Heart sounds: Normal heart sounds.  Pulmonary:     Effort: Pulmonary effort is normal.     Breath sounds: Normal breath sounds.  Abdominal:     Palpations: Abdomen is soft.     Tenderness: There is no abdominal tenderness.  Musculoskeletal:     Right lower leg: No edema.     Left lower leg: No edema.  Skin:    General: Skin is warm.     Findings: No rash.  Neurological:     General: No focal deficit present.     Mental Status: He is alert.     Cranial Nerves: No cranial nerve deficit.  Psychiatric:        Mood and Affect: Mood normal.        Behavior: Behavior normal.        Thought Content: Thought content normal.        Judgment: Judgment normal.     No results found.  Recent Results (from the past 2160 hour(s))  T-helper cell (CD4)- ([facility] only)      Status: Abnormal   Collection Time: 04/14/23  2:32 PM  Result Value Ref Range   CD4 T Cell Abs 1,107 400 - 1,790 /uL   CD4 % Helper T Cell 30 (L) 33 - 65 %    Comment: Performed at [facility], [address], [place], [place] [address]  RPR     Status: Abnormal   Collection Time: 04/14/23  2:32 PM  Result Value Ref Range   RPR Ser Ql REACTIVE (A) NON-REACTIVE  HIV-1 RNA quant-no reflex-bld     Status: Abnormal   Collection Time: 04/14/23  2:32 PM  Result Value Ref Range   HIV 1 RNA Quant 88 (H) Copies/mL   HIV-1 RNA Quant, Log 1.94 (H) Log cps/mL    Comment: . Reference Range:                           Not Detected     copies/mL                           Not Detected Log copies/mL . Drew Jenkins The test was performed using Real-Time Polymerase Chain Reaction. . . Reportable Range: 20 copies/mL to 10,000,000 copies/mL (1.30 Log copies/mL to 7.00 Log copies/mL). .   COMPLETE METABOLIC PANEL WITH GFR     Status: Abnormal   Collection Time: 04/14/23  2:32 PM  Result Value Ref Range   Glucose, Bld 86 65 - 99 mg/dL  Comment: .            Fasting reference interval .    BUN 14 7 - 25 mg/dL   Creat 1.61 0.96 - 0.45 mg/dL   eGFR 89 > OR = 60 WU/JWJ/1.91Y7   BUN/Creatinine Ratio SEE NOTE: 6 - 22 (calc)    Comment:    Not Reported: BUN and Creatinine are within    reference range. .    Sodium 141 135 - 146 mmol/L   Potassium 4.5 3.5 - 5.3 mmol/L   Chloride 105 98 - 110 mmol/L   CO2 29 20 - 32 mmol/L   Calcium 10.0 8.6 - 10.3 mg/dL   Total Protein 7.8 6.1 - 8.1 g/dL   Albumin 4.6 3.6 - 5.1 g/dL   Globulin 3.2 1.9 - 3.7 g/dL (calc)   AG Ratio 1.4 1.0 - 2.5 (calc)   Total Bilirubin 1.4 (H) 0.2 - 1.2 mg/dL   Alkaline phosphatase (APISO) 67 35 - 144 U/L   AST 23 10 - 35 U/L   ALT 21 9 - 46 U/L  CBC     Status: None   Collection Time: 04/14/23  2:32 PM  Result Value Ref Range   WBC 8.9 3.8 - 10.8 Thousand/uL   RBC 5.03 4.20 - 5.80 Million/uL   Hemoglobin  15.5 13.2 - 17.1 g/dL   HCT 82.9 56.2 - 13.0 %   MCV 89.1 80.0 - 100.0 fL   MCH 30.8 27.0 - 33.0 pg   MCHC 34.6 32.0 - 36.0 g/dL   RDW 86.5 78.4 - 69.6 %   Platelets 341 140 - 400 Thousand/uL   MPV 9.2 7.5 - 12.5 fL  Rpr titer     Status: Abnormal   Collection Time: 04/14/23  2:32 PM  Result Value Ref Range   RPR Titer 1:2 (H)   T PALLIDUM AB     Status: Abnormal   Collection Time: 04/14/23  2:32 PM  Result Value Ref Range   T Pallidum Abs POSITIVE (A) NEGATIVE    Comment: . Nontreponemal and treponemal antibodies detected. Consistent with past or current syphilis. Clinical evaluation should be performed to identify signs, symptoms, or history of past infection or treatment.         Garner Nash, MD, MS

## 2023-04-27 NOTE — Patient Instructions (Signed)
We are checking your thyroid and labs discussed.  We are ordering echocardiogram to assess for heart related shortness of breath.  We are referring to pulmonology for sleep study.

## 2023-04-28 ENCOUNTER — Ambulatory Visit: Payer: BC Managed Care – PPO | Admitting: Internal Medicine

## 2023-04-28 ENCOUNTER — Other Ambulatory Visit: Payer: Self-pay

## 2023-04-28 ENCOUNTER — Encounter: Payer: Self-pay | Admitting: Internal Medicine

## 2023-04-28 VITALS — BP 117/72 | HR 84 | Temp 98.6°F | Wt 170.4 lb

## 2023-04-28 DIAGNOSIS — Z5181 Encounter for therapeutic drug level monitoring: Secondary | ICD-10-CM | POA: Diagnosis not present

## 2023-04-28 DIAGNOSIS — Z113 Encounter for screening for infections with a predominantly sexual mode of transmission: Secondary | ICD-10-CM

## 2023-04-28 DIAGNOSIS — Z79899 Other long term (current) drug therapy: Secondary | ICD-10-CM | POA: Diagnosis not present

## 2023-04-28 DIAGNOSIS — B20 Human immunodeficiency virus [HIV] disease: Secondary | ICD-10-CM

## 2023-04-28 DIAGNOSIS — Z8619 Personal history of other infectious and parasitic diseases: Secondary | ICD-10-CM

## 2023-04-28 LAB — BRAIN NATRIURETIC PEPTIDE: Pro B Natriuretic peptide (BNP): 24 pg/mL (ref 0.0–100.0)

## 2023-04-28 LAB — TSH: TSH: 1.26 u[IU]/mL (ref 0.35–5.50)

## 2023-04-28 NOTE — Progress Notes (Unsigned)
   Subjective:    Patient ID: Drew Jenkins, male    DOB: 1966/06/10, 57 y.o.   MRN: [id number]  HPI Drew Jenkins is here for yearly follow up of HIV He continues on biktarvy and no missed doses.  No issues with getting or taking his medication.  No concerns today.  Continues to live in [place].    Review of Systems  Constitutional:  Negative for fatigue.  Gastrointestinal:  Negative for diarrhea and nausea.  Skin:  Negative for rash.       Objective:   Physical Exam Eyes:     General: No scleral icterus. Pulmonary:     Effort: Pulmonary effort is normal.  Neurological:     Mental Status: He is alert.   SH: no tobacco        Assessment & Plan:

## 2023-04-29 ENCOUNTER — Encounter: Payer: Self-pay | Admitting: Internal Medicine

## 2023-04-29 NOTE — Assessment & Plan Note (Signed)
T bili mildly elevated but otherwise no concerns.

## 2023-04-29 NOTE — Assessment & Plan Note (Signed)
He continues to do well.  Labs reviewed with him and no concerns.  No changes indicated and he can continue with yearly follow up.

## 2023-04-29 NOTE — Assessment & Plan Note (Signed)
Titer remains stable, no current sexual activity.

## 2023-04-29 NOTE — Assessment & Plan Note (Signed)
Lipid panel reviewed with him.  I discussed the Reprieve trial and benefits of a stain in people living with HIV and he will continue with lifestyle modifications for now.

## 2023-04-30 ENCOUNTER — Other Ambulatory Visit: Payer: Self-pay | Admitting: Surgery

## 2023-04-30 DIAGNOSIS — R0602 Shortness of breath: Secondary | ICD-10-CM | POA: Insufficient documentation

## 2023-04-30 DIAGNOSIS — Z9009 Acquired absence of other part of head and neck: Secondary | ICD-10-CM

## 2023-04-30 DIAGNOSIS — E042 Nontoxic multinodular goiter: Secondary | ICD-10-CM

## 2023-04-30 NOTE — Assessment & Plan Note (Addendum)
The patient reports chronic fatigue. Thyroid function tests are pending.  Differential diagnosis:  Hypothyroidism Anemia Sleep apnea Depression  Plan:  TSH: To assess for hypothyroidism. Follow-up: Review of thyroid function test results and consider adjusting levothyroxine dosage accordingly.

## 2023-04-30 NOTE — Assessment & Plan Note (Signed)
The patient has a history of shortness of breath and a high STOP-BANG score suggesting risk for moderate to severe obstructive sleep apnea.  Differential diagnosis:  Obstructive sleep apnea Asthma Pulmonary hypertension Cardiac issues (given past mild mitral regurgitation)  Plan:  Refer to pulmonology: To evaluate for sleep apnea and possibly perform breathing tests. Sleep Study: To confirm diagnosis of sleep apnea. Chest X-Ray: To assess current pulmonary status. Echocardiogram: Given history of mitral regurgitation and current symptoms of dyspnea, repeat echo to assess cardiac function. BNP: To rule out heart failure, though unlikely.

## 2023-05-14 ENCOUNTER — Encounter: Payer: Self-pay | Admitting: Family Medicine

## 2023-05-15 NOTE — Telephone Encounter (Signed)
Left patient a detailed voice message with annotation below

## 2023-05-19 ENCOUNTER — Encounter: Payer: BC Managed Care – PPO | Admitting: Family Medicine

## 2023-05-26 ENCOUNTER — Ambulatory Visit: Payer: BC Managed Care – PPO | Admitting: Internal Medicine

## 2023-05-28 ENCOUNTER — Encounter: Payer: Self-pay | Admitting: Family Medicine

## 2023-06-01 ENCOUNTER — Ambulatory Visit (HOSPITAL_COMMUNITY): Payer: BC Managed Care – PPO

## 2023-06-12 ENCOUNTER — Ambulatory Visit (INDEPENDENT_AMBULATORY_CARE_PROVIDER_SITE_OTHER): Payer: BC Managed Care – PPO | Admitting: Pulmonary Disease

## 2023-06-12 ENCOUNTER — Encounter: Payer: Self-pay | Admitting: Pulmonary Disease

## 2023-06-12 VITALS — BP 128/74 | HR 69 | Temp 97.9°F | Ht 64.0 in | Wt 171.6 lb

## 2023-06-12 DIAGNOSIS — R0602 Shortness of breath: Secondary | ICD-10-CM | POA: Diagnosis not present

## 2023-06-12 DIAGNOSIS — E042 Nontoxic multinodular goiter: Secondary | ICD-10-CM

## 2023-06-12 DIAGNOSIS — Z9009 Acquired absence of other part of head and neck: Secondary | ICD-10-CM

## 2023-06-12 NOTE — Patient Instructions (Addendum)
It is nice to meet you  Based on your chest x-ray in the past and your shortness of breath I worry a bit about something like asthma  To further evaluate and arrive the more precise diagnosis I recommend pulmonary function test I have ordered these  Use the sample inhaler, Spiriva 2 puffs in the morning, once a day.  Will meet again in the coming weeks after pulmonary function test to see if the inhaler helped and to discuss the results and next steps in terms of trying to help you feel better.  Return to clinic in 6 weeks or sooner as needed after pulmonary function test with Dr. Judeth Horn

## 2023-06-12 NOTE — Progress Notes (Signed)
@Patient  ID: Drew Jenkins, male    DOB: [birth year], 57 y.o.   MRN: [id number]  Chief Complaint  Patient presents with   Pulmonary Consult    Referred by Dr. Fanny Bien. Pt c/o SOB "for years" he gets winded with standing up and "excessive walking".      Referring provider: Garnette Gunner, MD  HPI:   57 y.o. man whom we are seeing for evaluation of dyspnea on exertion.  Most recent PCP note reviewed.  Most recent ID note reviewed.  Patient describes a history of dyspnea on exertion for years at this point.  Largely stable.  No time of day when things are better or worse.  No position to make things better or worse.  No seasonal or environmental factors he can identify to make things better or worse.  Generally seems worse when walking up inclines or stairs.  No other alleviating or exacerbating factors.  Has tried albuterol but does not think it helps too much.  Does not use very often.  No clear inciting or triggering event.  No cardiac workup today although echocardiogram has been ordered, not completed yet.  Reviewed most recent chest x-ray 08/2022 with chief complaint of shortness of breath, on my review interpretation reveals clear lungs bilaterally with mild hyperinflation.  He denies a history of asthma.  No limitation in terms of breathing when the child and adolescent or younger adult.  Questionaires / Pulmonary Flowsheets:   ACT:      No data to display          MMRC:     No data to display          Epworth:      No data to display          Tests:   FENO:  No results found for: "NITRICOXIDE"  PFT:     No data to display          WALK:      No data to display          Imaging: Personally reviewed and as per EMR discussion this note No results found.  Lab Results: Personally reviewed CBC    Component Value Date/Time   WBC 8.9 04/14/2023 1432   RBC 5.03 04/14/2023 1432   HGB 15.5 04/14/2023 1432   HGB 16.0 03/08/2021 1604    HCT 44.8 04/14/2023 1432   HCT 45.7 03/08/2021 1604   PLT 341 04/14/2023 1432   PLT 297 09/10/2020 1143   MCV 89.1 04/14/2023 1432   MCV 85 03/08/2021 1604   MCH 30.8 04/14/2023 1432   MCHC 34.6 04/14/2023 1432   RDW 12.5 04/14/2023 1432   RDW 12.2 03/08/2021 1604   LYMPHSABS 3.8 08/18/2022 1236   LYMPHSABS 4.7 (H) 03/08/2021 1604   MONOABS 0.7 08/18/2022 1236   EOSABS 0.4 08/18/2022 1236   EOSABS 0.2 03/08/2021 1604   BASOSABS 0.0 08/18/2022 1236   BASOSABS 0.0 03/08/2021 1604    BMET    Component Value Date/Time   NA 141 04/14/2023 1432   NA 140 03/08/2021 1604   K 4.5 04/14/2023 1432   CL 105 04/14/2023 1432   CO2 29 04/14/2023 1432   GLUCOSE 86 04/14/2023 1432   BUN 14 04/14/2023 1432   BUN 10 03/08/2021 1604   CREATININE 0.98 04/14/2023 1432   CALCIUM 10.0 04/14/2023 1432   GFRNONAA >60 08/18/2022 1236   GFRNONAA 75 02/25/2018 1010   GFRAA 98 09/10/2020 1143   GFRAA  86 02/25/2018 1010    BNP No results found for: "BNP"  ProBNP    Component Value Date/Time   PROBNP 24.0 04/27/2023 1521    Specialty Problems       Pulmonary Problems   Shortness of breath    Allergies  Allergen Reactions   Oxycodone Itching    Immunization History  Administered Date(s) Administered   DTaP 03/18/2011   Hep A / Hep B 04/22/2011, 05/29/2015   Hpv-Unspecified 10/07/2021   Influenza Inj Mdck Quad Pf 09/14/2019   Influenza Split 08/30/2019   Influenza,inj,Quad PF,6+ Mos 09/02/2018   Influenza-Unspecified 09/02/2018, 08/30/2019, 11/09/2020   Meningococcal Conjugate 09/02/2018   Meningococcal Mcv4o 09/02/2018   PFIZER(Purple Top)SARS-COV-2 Vaccination 02/11/2020, 03/03/2020, 11/09/2020   PPD Test 10/10/2016   Pfizer Covid-19 Vaccine Bivalent Booster 34yrs & up 10/11/2021   Pneumococcal Conjugate-13 12/15/2001   Pneumococcal Polysaccharide-23 02/22/2021   Tdap 04/25/2013   Zoster Recombinat (Shingrix) 06/07/2021    Past Medical History:  Diagnosis Date    HIV infection (HCC)     Tobacco History: Social History   Tobacco Use  Smoking Status Never   Passive exposure: Past  Smokeless Tobacco Never   Counseling given: Not Answered   Continue to not smoke  Outpatient Encounter Medications as of 06/12/2023  Medication Sig   albuterol (VENTOLIN HFA) 108 (90 Base) MCG/ACT inhaler SMARTSIG:2 Puff(s) By Mouth Every 4 Hours PRN   bictegravir-emtricitabine-tenofovir AF (BIKTARVY) 50-200-25 MG TABS tablet TAKE 1 TABLET BY MOUTH 1 TIME A DAY.   cetirizine (ZYRTEC ALLERGY) 10 MG tablet Take 1 tablet (10 mg total) by mouth daily.   Cyanocobalamin (VITAMIN B-12) 5000 MCG LOZG Take 1,000 tablets by mouth daily.   FLUoxetine (PROZAC) 40 MG capsule TAKE 1 CAPSULE (40 MG TOTAL) BY MOUTH DAILY.   fluticasone (FLONASE) 50 MCG/ACT nasal spray Place 2 sprays into both nostrils daily.   gabapentin (NEURONTIN) 600 MG tablet Take 1 tablet (600 mg total) by mouth 2 (two) times daily.   levothyroxine (SYNTHROID) 175 MCG tablet Take 1 tablet (175 mcg total) by mouth daily.   meclizine (ANTIVERT) 25 MG tablet Take 1 tablet (25 mg total) by mouth 3 (three) times daily as needed for dizziness.   metaxalone (SKELAXIN) 800 MG tablet TAKE 0.5 TO 1 TABLETS BY MOUTH 3 (THREE) TIMES DAILY AS NEEDED FOR MUSCLE SPASMS.   Multiple Vitamin (MULTIVITAMIN) tablet Take 1 tablet by mouth daily.   pantoprazole (PROTONIX) 20 MG tablet TAKE 1 TABLET BY MOUTH EVERY DAY   VITAMIN D PO Take by mouth daily.   No facility-administered encounter medications on file as of 06/12/2023.     Review of Systems  Review of Systems  No chest pain with exertion orthopnea or PND.  Comprehensive review of systems otherwise negative. Physical Exam  BP 128/74 (BP Location: Left Arm, Cuff Size: Normal)   Pulse 69   Temp 97.9 F (36.6 C) (Oral)   Ht 5\' 4"  (1.626 m)   Wt 171 lb 9.6 oz (77.8 kg)   SpO2 99% Comment: on RA  BMI 29.46 kg/m   Wt Readings from Last 5 Encounters:  06/12/23 171 lb  9.6 oz (77.8 kg)  04/28/23 170 lb 6.4 oz (77.3 kg)  04/27/23 166 lb 3.2 oz (75.4 kg)  02/16/23 163 lb (73.9 kg)  09/30/22 169 lb 6.4 oz (76.8 kg)    BMI Readings from Last 5 Encounters:  06/12/23 29.46 kg/m  04/28/23 28.80 kg/m  04/27/23 28.09 kg/m  02/16/23 27.55 kg/m  09/30/22 28.63  kg/m     Physical Exam General: Sitting in chair, no acute distress Eyes: EOMI, no icterus Neck: Supple, no JVP Pulmonary: Clear, normal work of breathing Cardiovascular: Warm, no edema Abdomen: Nondistended, bowel sounds present MSK: No synovitis, no joint effusion Neuro: Normal gait, no weakness Psych: Normal mood, flat affect   Assessment & Plan:   Dyspnea on exertion: With mild hyperinflation on chest x-ray in 2023.  Never smoker.  High suspicion for asthma.  Bronchodilator via Spiriva.  PFTs for further evaluation.  Hyperinflation on chest x-ray: Suspect related to obstructive process such as asthma given he is a never smoker.  Further workup as above.  Concern for sleep apnea: States snores loudly.  Interested in sleep test.  Would like to pursue workup and treatment for shortness of breath first, plan to address upcoming visit.    Return in about 6 weeks (around 07/24/2023) for after PFT.   Karren Burly, MD 06/12/2023   This appointment required 61 minutes of patient care (this includes precharting, chart review, review of results, face-to-face care, etc.).

## 2023-06-23 ENCOUNTER — Encounter: Payer: Self-pay | Admitting: Family Medicine

## 2023-06-23 DIAGNOSIS — G2581 Restless legs syndrome: Secondary | ICD-10-CM

## 2023-06-25 MED ORDER — GABAPENTIN 600 MG PO TABS: 600.0000 mg | ORAL_TABLET | Freq: Two times a day (BID) | ORAL | 1 refills | Status: DC

## 2023-07-03 ENCOUNTER — Encounter: Payer: Self-pay | Admitting: Family Medicine

## 2023-07-03 ENCOUNTER — Ambulatory Visit (INDEPENDENT_AMBULATORY_CARE_PROVIDER_SITE_OTHER): Payer: BC Managed Care – PPO | Admitting: Family Medicine

## 2023-07-03 ENCOUNTER — Ambulatory Visit (HOSPITAL_COMMUNITY): Payer: BC Managed Care – PPO | Attending: Internal Medicine

## 2023-07-03 VITALS — BP 124/72 | HR 76 | Temp 97.0°F | Ht 64.0 in | Wt 171.0 lb

## 2023-07-03 DIAGNOSIS — E663 Overweight: Secondary | ICD-10-CM

## 2023-07-03 DIAGNOSIS — R42 Dizziness and giddiness: Secondary | ICD-10-CM | POA: Diagnosis not present

## 2023-07-03 DIAGNOSIS — R0602 Shortness of breath: Secondary | ICD-10-CM

## 2023-07-03 DIAGNOSIS — Z0000 Encounter for general adult medical examination without abnormal findings: Secondary | ICD-10-CM | POA: Diagnosis not present

## 2023-07-03 DIAGNOSIS — G2581 Restless legs syndrome: Secondary | ICD-10-CM

## 2023-07-03 DIAGNOSIS — E7800 Pure hypercholesterolemia, unspecified: Secondary | ICD-10-CM | POA: Insufficient documentation

## 2023-07-03 DIAGNOSIS — I340 Nonrheumatic mitral (valve) insufficiency: Secondary | ICD-10-CM

## 2023-07-03 DIAGNOSIS — F331 Major depressive disorder, recurrent, moderate: Secondary | ICD-10-CM | POA: Insufficient documentation

## 2023-07-03 DIAGNOSIS — Z1211 Encounter for screening for malignant neoplasm of colon: Secondary | ICD-10-CM

## 2023-07-03 DIAGNOSIS — R5382 Chronic fatigue, unspecified: Secondary | ICD-10-CM

## 2023-07-03 DIAGNOSIS — K219 Gastro-esophageal reflux disease without esophagitis: Secondary | ICD-10-CM | POA: Diagnosis not present

## 2023-07-03 DIAGNOSIS — K649 Unspecified hemorrhoids: Secondary | ICD-10-CM

## 2023-07-03 DIAGNOSIS — Z125 Encounter for screening for malignant neoplasm of prostate: Secondary | ICD-10-CM

## 2023-07-03 DIAGNOSIS — B20 Human immunodeficiency virus [HIV] disease: Secondary | ICD-10-CM

## 2023-07-03 LAB — ECHOCARDIOGRAM COMPLETE
Area-P 1/2: 3.91 cm2
S' Lateral: 3.3 cm

## 2023-07-03 MED ORDER — MECLIZINE HCL 25 MG PO TABS
25.0000 mg | ORAL_TABLET | Freq: Three times a day (TID) | ORAL | 1 refills | Status: AC | PRN
Start: 2023-07-03 — End: ?

## 2023-07-03 MED ORDER — PANTOPRAZOLE SODIUM 20 MG PO TBEC
20.0000 mg | DELAYED_RELEASE_TABLET | Freq: Every day | ORAL | 3 refills | Status: AC
Start: 2023-07-03 — End: ?

## 2023-07-03 MED ORDER — FLUOXETINE HCL 40 MG PO CAPS: 40.0000 mg | ORAL_CAPSULE | Freq: Every day | ORAL | 2 refills | Status: DC

## 2023-07-03 NOTE — Progress Notes (Signed)
Assessment  Assessment/Plan:   Problem List Items Addressed This Visit       Cardiovascular and Mediastinum   Hemorrhoids    Patient reports occasional irritation and swelling, particularly with prolonged sitting on the toilet. -Recommend high fiber diet, increased fluid intake, and minimizing time spent on the toilet. -Consider over-the-counter treatments such as Preparation H or hydrocortisone suppositories as needed.        Digestive   GERD (gastroesophageal reflux disease)   Relevant Medications   meclizine (ANTIVERT) 25 MG tablet   pantoprazole (PROTONIX) 20 MG tablet     Other   Human immunodeficiency virus I infection (HCC)    Continue Biktarvy as prescribed. Follow up with Dr. Luciana Axe for routine HIV labs.      RLS (restless legs syndrome)    Continue gabapentin 600 mg at night. Consider ropinirole as alternative in the future if gabapentin causes significant weight issues.      Relevant Orders   Urinalysis w microscopic + reflex cultur   Overweight (BMI 25.0-29.9)    Referral to a dietitian for nutrition counseling. Consider discussing GLP-1 agonists like [employer] pending insurance approval.      Relevant Orders   Amb ref to Medical Nutrition Therapy-MNT   Fatigue    Continue with Spiriva as prescribed by pulmonologist. Follow up with pulmonologist for potential sleep study evaluation.      Moderate episode of recurrent major depressive disorder (HCC)   Relevant Medications   FLUoxetine (PROZAC) 40 MG capsule   Pure hypercholesterolemia     lipid panel (fasting) as part of the preventive visit. Recommend Cologuard for colon cancer screening      Relevant Orders   Lipid Profile   Other Visit Diagnoses     Encounter for well adult exam without abnormal findings    -  Primary   Dizziness       Relevant Medications   meclizine (ANTIVERT) 25 MG tablet   Screening for colon cancer       Relevant Orders   Cologuard   Screening for malignant neoplasm  of prostate       Relevant Orders   PSA       Medications Discontinued During This Encounter  Medication Reason   metaxalone (SKELAXIN) 800 MG tablet    fluticasone (FLONASE) 50 MCG/ACT nasal spray    VITAMIN D PO    pantoprazole (PROTONIX) 20 MG tablet Reorder   meclizine (ANTIVERT) 25 MG tablet Reorder   FLUoxetine (PROZAC) 40 MG capsule Reorder    Patient Counseling(The following topics were reviewed and/or handout was given):  -Nutrition: Stressed importance of moderation in sodium/caffeine intake, saturated fat and cholesterol, caloric balance, sufficient intake of fresh fruits, vegetables, and fiber.  -Stressed the importance of regular exercise.   -Substance Abuse: Discussed cessation/primary prevention of tobacco, alcohol, or other drug use; driving or other dangerous activities under the influence; availability of treatment for abuse.   -Injury prevention: Discussed safety belts, safety helmets, smoke detector, smoking near bedding or upholstery.   -Sexuality: Discussed sexually transmitted diseases, partner selection, use of condoms, avoidance of unintended pregnancy and contraceptive alternatives.   -Dental health: Discussed importance of regular tooth brushing, flossing, and dental visits.  -Health maintenance and immunizations reviewed. Please refer to Health maintenance section.  Return to care in 1 year for next preventative visit.          Subjective:  Chief complaint Encounter date: 07/03/2023  Chief Complaint  Patient presents with   Annual Exam  Talk about Weight loss injections    Drew Jenkins is a 57 y.o. male who presents today for his annual comprehensive physical exam.    Discussed the use of AI scribe software for clinical note transcription with the patient, who gave verbal consent to proceed.  Fatigue and shortness of breath The patient has been experiencing fatigue and shortness of breath, which was being followed up with a pulmonologist.  The patient was prescribed Spiriva for potential asthma but has not noticed significant improvement. Recent tests including BNP, CBC, and thyroid levels were normal. The pulmonologist suggested further workup, including consideration of sleep apnea, although they wanted to evaluate the effect of Spiriva first.  Weight Management The patient expressed interest in discussing injections for weight loss. The patient believes that weight gain could be contributing to fatigue. The patient reported a poor diet, high in sweets, and acknowledged minimal structured exercise despite having an active job involving considerable daily walking.he patient has tried over-the-counter weight loss supplements in the past with no noticeable effect. He expresses interest in exploring weight loss injections.  Restless Legs Syndrome The patient has been managing restless legs syndrome with gabapentin for several years. No other medications have been significantly effective. The patient reiterated the possibility of gabapentin contributing to weight gain.  Vertigo the patient also reports occasional vertigo, managed as needed with meclizine,   GERD: Daily heartburn, stable with Protonix.   Depression: His mood is stable on fluoxetine by phq < 9, scores are attributed to increased appetite and tiredness.  Hemorrhoids: The patient also reports a history of hemorrhoids, which become irritated and swollen if he sits on the toilet for extended periods. He manages this with a high fiber diet and reduced time on the toilet.   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  The patient's preventative care is up-to-date, with recent normal results from a Cologuard test and vaccinations. He has a history of mildly elevated cholesterol, last checked in 2022, and stable thyroid function on a consistent dose of Synthroid. The patient's HIV is managed  with Biktarvy.          07/03/2023    3:51 PM 04/27/2023    3:19 PM 02/16/2023    3:20 PM 09/02/2022   11:00 AM 05/27/2022    9:53 AM  Depression screen PHQ 2/9  Decreased Interest 0 1 0 0 0  Down, Depressed, Hopeless 0 1 0 0 1  PHQ - 2 Score 0 2 0 0 1  Altered sleeping  2 2 3    Tired, decreased energy 2 3 3 3    Change in appetite 3 2 1 3    Feeling bad or failure about yourself  0 1 3 1    Trouble concentrating 0 0 0 2   Moving slowly or fidgety/restless 0 0 0 3   Suicidal thoughts 0 0 0 0   PHQ-9 Score  10 9 15    Difficult doing work/chores Not difficult at all Not difficult at all Not difficult at all Not difficult at all     Diet: Patient admits to a diet high in sweets. Exercise: Patient walks over 3000 steps daily at work.  Review of Systems  Constitutional:  Positive for malaise/fatigue. Negative for chills, diaphoresis, fever and weight loss.  HENT:  Positive for hearing loss (Chronic, improved with hearing aids bilateral). Negative for ear discharge and ear pain.   Eyes:  Negative  for blurred vision, double vision, photophobia, pain, discharge and redness.  Respiratory:  Positive for shortness of breath. Negative for cough, sputum production and wheezing.   Cardiovascular:  Negative for chest pain and palpitations.  Gastrointestinal:  Negative for abdominal pain, blood in stool, constipation, diarrhea, heartburn (Controlled on pantoprazole), melena, nausea and vomiting.  Genitourinary:  Negative for dysuria, flank pain, frequency, hematuria and urgency.  Musculoskeletal:  Negative for myalgias.  Skin:  Negative for itching and rash.  Neurological:  Negative for dizziness, tingling, tremors, speech change, seizures, loss of consciousness, weakness and headaches.  Endo/Heme/Allergies:  Negative for environmental allergies.  Psychiatric/Behavioral:  Negative for depression (Stable on fluoxetine), hallucinations, memory loss, substance abuse and suicidal ideas. The patient does  not have insomnia.   All other systems reviewed and are negative.      04/27/2023    3:19 PM 02/16/2023    3:21 PM 09/02/2022   11:00 AM 06/07/2021    1:58 PM  GAD-7 Generalized Anxiety Disorder Screening Tool  1. Feeling Nervous, Anxious, or on Edge 1 0 1 0  2. Not Being Able to Stop or Control Worrying 1 0 1 0  3. Worrying Too Much About Different Things 2 1 2  0  4. Trouble Relaxing 2 2 2  0  5. Being So Restless it's Hard To Sit Still 2 2 2  0  6. Becoming Easily Annoyed or Irritable 2 2 2  0  7. Feeling Afraid As If Something Awful Might Happen 2 2 0 0  Total GAD-7 Score 12 9 10  0  Difficulty At Work, Home, or Getting  Along With Others? Not difficult at all Not difficult at all Not difficult at all       07/03/2023    3:51 PM 04/27/2023    3:19 PM 02/16/2023    3:20 PM 09/02/2022   11:00 AM 05/27/2022    9:53 AM 06/07/2021    1:57 PM 03/08/2021   10:07 AM  Depression screen PHQ 2/9  Decreased Interest 0 1 0 0 0 0 0  Down, Depressed, Hopeless 0 1 0 0 1 0 0  PHQ - 2 Score 0 2 0 0 1 0 0  Altered sleeping  2 2 3   0   Tired, decreased energy 2 3 3 3   0   Change in appetite 3 2 1 3   0   Feeling bad or failure about yourself  0 1 3 1   0   Trouble concentrating 0 0 0 2  0   Moving slowly or fidgety/restless 0 0 0 3  0   Suicidal thoughts 0 0 0 0  0   PHQ-9 Score  10 9 15   0   Difficult doing work/chores Not difficult at all Not difficult at all Not difficult at all Not difficult at all       Health Maintenance Due  Topic Date Due   Zoster Vaccines- Shingrix (2 of 2) 08/02/2021   COVID-19 Vaccine (5 - 2023-24 season) 08/15/2022   Fecal DNA (Cologuard)  04/04/2023   DTaP/Tdap/Td (3 - Td or Tdap) 04/26/2023       PMH:  The following were reviewed and entered/updated in epic: Past Medical History:  Diagnosis Date   HIV infection (HCC)     Patient Active Problem List   Diagnosis Date Noted   Moderate episode of recurrent major depressive disorder (HCC) 07/03/2023   Pure  hypercholesterolemia 07/03/2023   Hemorrhoids 07/03/2023   Shortness of breath 04/30/2023   Vertigo 09/02/2022  Fatigue 02/24/2019   Medication monitoring encounter 03/11/2018   Screening examination for venereal disease 04/09/2017   Encounter for long-term (current) use of high-risk medication 04/09/2017   Depression 11/20/2016   GERD (gastroesophageal reflux disease) 11/20/2016   Human immunodeficiency virus I infection (HCC) 08/21/2016   Hypothyroidism, adult 08/21/2016   RLS (restless legs syndrome) 08/21/2016   H/O syphilis 08/21/2016   Overweight (BMI 25.0-29.9) 08/21/2016   Abnormal anal Papanicolaou smear 08/21/2016   Lumbago 08/21/2016    No past surgical history on file.  Family History  Problem Relation Age of Onset   Diabetes Mother    Arthritis/Rheumatoid Mother    Hypertension Father     Medications- reviewed and updated Outpatient Medications Prior to Visit  Medication Sig Dispense Refill   albuterol (VENTOLIN HFA) 108 (90 Base) MCG/ACT inhaler SMARTSIG:2 Puff(s) By Mouth Every 4 Hours PRN     bictegravir-emtricitabine-tenofovir AF (BIKTARVY) 50-200-25 MG TABS tablet TAKE 1 TABLET BY MOUTH 1 TIME A DAY. 90 tablet 3   cetirizine (ZYRTEC ALLERGY) 10 MG tablet Take 1 tablet (10 mg total) by mouth daily. 30 tablet 0   Cyanocobalamin (VITAMIN B-12) 5000 MCG LOZG Take 1,000 tablets by mouth daily.     gabapentin (NEURONTIN) 600 MG tablet Take 1 tablet (600 mg total) by mouth 2 (two) times daily. 180 tablet 1   levothyroxine (SYNTHROID) 175 MCG tablet Take 1 tablet (175 mcg total) by mouth daily. 90 tablet 3   Multiple Vitamin (MULTIVITAMIN) tablet Take 1 tablet by mouth daily.     FLUoxetine (PROZAC) 40 MG capsule TAKE 1 CAPSULE (40 MG TOTAL) BY MOUTH DAILY. 90 capsule 2   meclizine (ANTIVERT) 25 MG tablet Take 1 tablet (25 mg total) by mouth 3 (three) times daily as needed for dizziness. 30 tablet 1   metaxalone (SKELAXIN) 800 MG tablet TAKE 0.5 TO 1 TABLETS BY  MOUTH 3 (THREE) TIMES DAILY AS NEEDED FOR MUSCLE SPASMS. 60 tablet 0   pantoprazole (PROTONIX) 20 MG tablet TAKE 1 TABLET BY MOUTH EVERY DAY 90 tablet 3   fluticasone (FLONASE) 50 MCG/ACT nasal spray Place 2 sprays into both nostrils daily. (Patient not taking: Reported on 07/03/2023) 16 g 6   VITAMIN D PO Take by mouth daily. (Patient not taking: Reported on 07/03/2023)     No facility-administered medications prior to visit.    Allergies  Allergen Reactions   Oxycodone Itching    Social History   Socioeconomic History   Marital status: Divorced    Spouse name: Not on file   Number of children: Not on file   Years of education: Not on file   Highest education level: 12th grade  Occupational History   Not on file  Tobacco Use   Smoking status: Never    Passive exposure: Past   Smokeless tobacco: Never  Vaping Use   Vaping status: Never Used  Substance and Sexual Activity   Alcohol use: No   Drug use: No   Sexual activity: Not Currently    Comment: pt declined condoms  Other Topics Concern   Not on file  Social History Narrative   Not on file   Social Determinants of Health   Financial Resource Strain: Low Risk  (04/23/2023)   Overall Financial Resource Strain (CARDIA)    Difficulty of Paying Living Expenses: Not hard at all  Food Insecurity: Food Insecurity Present (04/23/2023)   Hunger Vital Sign    Worried About Running Out of Food in the Last Year: Never true  Ran Out of Food in the Last Year: Sometimes true  Transportation Needs: No Transportation Needs (04/23/2023)   PRAPARE - Administrator, Civil Service (Medical): No    Lack of Transportation (Non-Medical): No  Physical Activity: Unknown (04/23/2023)   Exercise Vital Sign    Days of Exercise per Week: 5 days    Minutes of Exercise per Session: Not on file  Stress: No Stress Concern Present (04/23/2023)   [employer] of Occupational Health - Occupational Stress Questionnaire    Feeling of Stress  : Only a little  Social Connections: Moderately Isolated (04/23/2023)   Social Connection and Isolation Panel [NHANES]    Frequency of Communication with Friends and Family: More than three times a week    Frequency of Social Gatherings with Friends and Family: Patient declined    Attends Religious Services: 1 to 4 times per year    Active Member of [employer] or Organizations: No    Attends Engineer, structural: Not on file    Marital Status: Divorced        Objective:  Physical Exam: BP 124/72   Pulse 76   Temp (!) 97 F (36.1 C) (Temporal)   Ht 5\' 4"  (1.626 m)   Wt 171 lb (77.6 kg)   SpO2 98%   BMI 29.35 kg/m   Body mass index is 29.35 kg/m. Wt Readings from Last 3 Encounters:  07/03/23 171 lb (77.6 kg)  06/12/23 171 lb 9.6 oz (77.8 kg)  04/28/23 170 lb 6.4 oz (77.3 kg)    Physical Exam Constitutional:      General: He is not in acute distress.    Appearance: Normal appearance. He is not ill-appearing or toxic-appearing.  HENT:     Head: Normocephalic and atraumatic.     Right Ear: Hearing, tympanic membrane, ear canal and external ear normal. There is no impacted cerumen.     Left Ear: Hearing, tympanic membrane, ear canal and external ear normal. There is no impacted cerumen.     Nose: Nose normal. No congestion.     Mouth/Throat:     Lips: No lesions.     Mouth: Mucous membranes are moist.     Pharynx: Oropharynx is clear. No oropharyngeal exudate.  Eyes:     General: No scleral icterus.       Right eye: No discharge.        Left eye: No discharge.     Conjunctiva/sclera: Conjunctivae normal.     Pupils: Pupils are equal, round, and reactive to light.  Neck:     Thyroid: No thyroid mass, thyromegaly or thyroid tenderness.  Cardiovascular:     Rate and Rhythm: Normal rate and regular rhythm.     Pulses: Normal pulses.     Heart sounds: Normal heart sounds.  Pulmonary:     Effort: Pulmonary effort is normal. No respiratory distress.     Breath sounds:  Normal breath sounds.  Abdominal:     General: Abdomen is flat. Bowel sounds are normal.     Palpations: Abdomen is soft.  Musculoskeletal:        General: Normal range of motion.     Cervical back: Normal range of motion.     Right lower leg: No edema.     Left lower leg: No edema.  Lymphadenopathy:     Cervical: No cervical adenopathy.  Skin:    General: Skin is warm and dry.     Findings: No rash.  Neurological:  General: No focal deficit present.     Mental Status: He is alert and oriented to person, place, and time. Mental status is at baseline.     Deep Tendon Reflexes:     Reflex Scores:      Patellar reflexes are 2+ on the right side and 2+ on the left side. Psychiatric:        Mood and Affect: Mood normal.        Behavior: Behavior normal.        Thought Content: Thought content normal.        Judgment: Judgment normal.         At today's visit, we discussed treatment options, associated risk and benefits, and engage in counseling as needed.  Additionally the following were reviewed: Past medical records, past medical and surgical history, family and social background, as well as relevant laboratory results, imaging findings, and specialty notes, where applicable.  This message was generated using dictation software, and as a result, it may contain unintentional typos or errors.  Nevertheless, extensive effort was made to accurately convey at the pertinent aspects of the patient visit.    There may have been are other unrelated non-urgent complaints, but due to the busy schedule and the amount of time already spent with him, time does not permit to address these issues at today's visit. Another appointment may have or has been requested to review these additional issues.   Thomes Dinning, MD, MS

## 2023-07-03 NOTE — Assessment & Plan Note (Addendum)
Continue with Spiriva as prescribed by pulmonologist. Follow up with pulmonologist for potential sleep study evaluation.

## 2023-07-03 NOTE — Assessment & Plan Note (Addendum)
lipid panel (fasting) as part of the preventive visit. Recommend Cologuard for colon cancer screening

## 2023-07-03 NOTE — Assessment & Plan Note (Addendum)
Referral to a dietitian for nutrition counseling. Consider discussing GLP-1 agonists like [employer] pending insurance approval.

## 2023-07-03 NOTE — Assessment & Plan Note (Signed)
Patient reports occasional irritation and swelling, particularly with prolonged sitting on the toilet. -Recommend high fiber diet, increased fluid intake, and minimizing time spent on the toilet. -Consider over-the-counter treatments such as Preparation H or hydrocortisone suppositories as needed.

## 2023-07-03 NOTE — Patient Instructions (Signed)
VISIT SUMMARY:  During your recent visit, we discussed your ongoing issues with fatigue, shortness of breath, weight gain, and occasional hemorrhoids. We also reviewed your management of HIV, thyroid disease, and restless leg syndrome. Your hearing aids seem to be working well, and you are due for some routine health maintenance checks.  YOUR PLAN:  -CHRONIC SHORTNESS OF BREATH: This is a condition where you feel like you can't get enough air or your chest is tight. We will continue your current medication, Spiriva, and recommend a follow-up with a lung specialist for further evaluation, which may include a sleep study.  -HIGH CHOLESTEROL: This is a condition where you have too much of certain fats in your blood. We will order a fasting lipid panel to check your cholesterol levels.  -THYROID DISEASE: This is a condition where your thyroid gland doesn't produce enough thyroid hormone. Continue taking your current dose of Levothyroxine daily.  -WEIGHT MANAGEMENT: We discussed your interest in weight loss injections and the potential impact of your restless leg syndrome medication on your weight. We recommend considering a consultation with a dietician and possibly using Wegovy for weight management if approved by your insurance.  -HEMORRHOIDS: These are swollen veins in your lower rectum and anus. We recommend a high fiber diet, increased fluid intake, and minimizing time spent on the toilet to help manage this issue.  -RESTLESS LEG SYNDROME: This is a condition that causes an uncontrollable urge to move your legs. Continue taking Gabapentin at night as prescribed.  INSTRUCTIONS:  We will also order a Cologuard test for colorectal cancer screening, check your immunization status for tetanus, and order a urine test. Please follow up in 1 year unless issues arise sooner.

## 2023-07-03 NOTE — Assessment & Plan Note (Signed)
Continue Biktarvy as prescribed. Follow up with Dr. Luciana Axe for routine HIV labs.

## 2023-07-03 NOTE — Assessment & Plan Note (Signed)
Continue gabapentin 600 mg at night. Consider ropinirole as alternative in the future if gabapentin causes significant weight issues.

## 2023-07-04 LAB — URINALYSIS W MICROSCOPIC + REFLEX CULTURE
Bacteria, UA: NONE SEEN /HPF
Bilirubin Urine: NEGATIVE
Glucose, UA: NEGATIVE
Hgb urine dipstick: NEGATIVE
Hyaline Cast: NONE SEEN /LPF
Ketones, ur: NEGATIVE
Leukocyte Esterase: NEGATIVE
Nitrites, Initial: NEGATIVE
Protein, ur: NEGATIVE
Specific Gravity, Urine: 1.013 (ref 1.001–1.035)
Squamous Epithelial / HPF: NONE SEEN /HPF (ref <=5)
WBC, UA: NONE SEEN /HPF (ref 0–5)
pH: 7 (ref 5.0–8.0)

## 2023-07-04 LAB — NO CULTURE INDICATED

## 2023-07-10 ENCOUNTER — Other Ambulatory Visit (INDEPENDENT_AMBULATORY_CARE_PROVIDER_SITE_OTHER): Payer: BC Managed Care – PPO

## 2023-07-10 DIAGNOSIS — Z125 Encounter for screening for malignant neoplasm of prostate: Secondary | ICD-10-CM

## 2023-07-10 DIAGNOSIS — E7800 Pure hypercholesterolemia, unspecified: Secondary | ICD-10-CM | POA: Diagnosis not present

## 2023-07-10 LAB — LIPID PANEL
Cholesterol: 181 mg/dL (ref 0–200)
HDL: 36.2 mg/dL — ABNORMAL LOW (ref >39.00)
LDL Cholesterol: 112 mg/dL — ABNORMAL HIGH (ref 0–99)
NonHDL: 144.77
Total CHOL/HDL Ratio: 5
Triglycerides: 165 mg/dL — ABNORMAL HIGH (ref 0.0–149.0)
VLDL: 33 mg/dL (ref 0.0–40.0)

## 2023-07-10 LAB — PSA: PSA: 0.47 ng/mL (ref 0.10–4.00)

## 2023-09-14 ENCOUNTER — Ambulatory Visit: Payer: BC Managed Care – PPO | Admitting: Nutrition

## 2023-12-07 ENCOUNTER — Other Ambulatory Visit: Payer: Self-pay | Admitting: Internal Medicine

## 2023-12-07 DIAGNOSIS — Z21 Asymptomatic human immunodeficiency virus [HIV] infection status: Secondary | ICD-10-CM

## 2023-12-10 ENCOUNTER — Encounter: Payer: Self-pay | Admitting: Family Medicine

## 2023-12-28 ENCOUNTER — Ambulatory Visit: Payer: 59 | Admitting: Family Medicine

## 2023-12-29 ENCOUNTER — Telehealth: Payer: 59 | Admitting: Family Medicine

## 2023-12-29 DIAGNOSIS — J069 Acute upper respiratory infection, unspecified: Secondary | ICD-10-CM

## 2023-12-29 MED ORDER — AMOXICILLIN 875 MG PO TABS: 875.0000 mg | ORAL_TABLET | Freq: Two times a day (BID) | ORAL | 0 refills | Status: AC

## 2023-12-29 NOTE — Progress Notes (Signed)
 Virtual Visit Consent   Drew Jenkins, you are scheduled for a virtual visit with a [facility] provider today. Just as with appointments in the office, your consent must be obtained to participate. Your consent will be active for this visit and any virtual visit you may have with one of our providers in the next 365 days. If you have a MyChart account, a copy of this consent can be sent to you electronically.  As this is a virtual visit, video technology does not allow for your provider to perform a traditional examination. This may limit your provider's ability to fully assess your condition. If your provider identifies any concerns that need to be evaluated in person or the need to arrange testing (such as labs, EKG, etc.), we will make arrangements to do so. Although advances in technology are sophisticated, we cannot ensure that it will always work on either your end or our end. If the connection with a video visit is poor, the visit may have to be switched to a telephone visit. With either a video or telephone visit, we are not always able to ensure that we have a secure connection.  By engaging in this virtual visit, you consent to the provision of healthcare and authorize for your insurance to be billed (if applicable) for the services provided during this visit. Depending on your insurance coverage, you may receive a charge related to this service.  I need to obtain your verbal consent now. Are you willing to proceed with your visit today? Drew Jenkins has provided verbal consent on 12/29/2023 for a virtual visit (video or telephone). Loa Lamp, FNP  Date: 12/29/2023 5:01 PM  Virtual Visit via Video Note   I, Loa Lamp, connected with  Drew Jenkins  ([id number], Apr 28, 1966) on 12/29/23 at  5:00 PM EST by a video-enabled telemedicine application and verified that I am speaking with the correct person using two identifiers.  Location: Patient: Virtual Visit Location Patient:  Home Provider: Virtual Visit Location Provider: Home Office   I discussed the limitations of evaluation and management by telemedicine and the availability of in person appointments. The patient expressed understanding and agreed to proceed.    History of Present Illness: Drew Jenkins is a 58 y.o. who identifies as a male who was assigned male at birth, and is being seen today for cough, head congestion, sneezing, sore throat, no fever, wheezing and sob. Sx for over a week persistent. [name]  HPI: HPI  Problems:  Patient Active Problem List   Diagnosis Date Noted   Moderate episode of recurrent major depressive disorder (HCC) 07/03/2023   Pure hypercholesterolemia 07/03/2023   Hemorrhoids 07/03/2023   Shortness of breath 04/30/2023   Vertigo 09/02/2022   Fatigue 02/24/2019   Medication monitoring encounter 03/11/2018   Screening examination for venereal disease 04/09/2017   Encounter for long-term (current) use of high-risk medication 04/09/2017   Depression 11/20/2016   GERD (gastroesophageal reflux disease) 11/20/2016   Human immunodeficiency virus I infection (HCC) 08/21/2016   Hypothyroidism, adult 08/21/2016   RLS (restless legs syndrome) 08/21/2016   H/O syphilis 08/21/2016   Overweight (BMI 25.0-29.9) 08/21/2016   Abnormal anal Papanicolaou smear 08/21/2016   Lumbago 08/21/2016    Allergies:  Allergies  Allergen Reactions   Oxycodone  Itching   Medications:  Current Outpatient Medications:    albuterol  (VENTOLIN  HFA) 108 (90 Base) MCG/ACT inhaler, SMARTSIG:2 Puff(s) By Mouth Every 4 Hours PRN, Disp: , Rfl:    BIKTARVY  50-200-25 MG  TABS tablet, TAKE 1 TABLET BY MOUTH 1 TIME A DAY, Disp: 90 tablet, Rfl: 1   cetirizine  (ZYRTEC  ALLERGY) 10 MG tablet, Take 1 tablet (10 mg total) by mouth daily., Disp: 30 tablet, Rfl: 0   Cyanocobalamin (VITAMIN B-12) 5000 MCG LOZG, Take 1,000 tablets by mouth daily., Disp: , Rfl:    FLUoxetine  (PROZAC ) 40 MG capsule, Take 1 capsule (40 mg  total) by mouth daily., Disp: 90 capsule, Rfl: 2   gabapentin  (NEURONTIN ) 600 MG tablet, Take 1 tablet (600 mg total) by mouth 2 (two) times daily., Disp: 180 tablet, Rfl: 1   levothyroxine  (SYNTHROID ) 175 MCG tablet, Take 1 tablet (175 mcg total) by mouth daily., Disp: 90 tablet, Rfl: 3   meclizine  (ANTIVERT ) 25 MG tablet, Take 1 tablet (25 mg total) by mouth 3 (three) times daily as needed for dizziness., Disp: 30 tablet, Rfl: 1   Multiple Vitamin (MULTIVITAMIN) tablet, Take 1 tablet by mouth daily., Disp: , Rfl:    pantoprazole  (PROTONIX ) 20 MG tablet, Take 1 tablet (20 mg total) by mouth daily., Disp: 90 tablet, Rfl: 3  Observations/Objective: Patient is well-developed, well-nourished in no acute distress.  Resting comfortably  at home.  Head is normocephalic, atraumatic.  No labored breathing.  Speech is clear and coherent with logical content.  Patient is alert and oriented at baseline.    Assessment and Plan: 1. Upper respiratory tract infection, unspecified type (Primary)  Increase fluid, humidifier at night, continue otc meds, uc if sx persist or worsen.   Follow Up Instructions: I discussed the assessment and treatment plan with the patient. The patient was provided an opportunity to ask questions and all were answered. The patient agreed with the plan and demonstrated an understanding of the instructions.  A copy of instructions were sent to the patient via MyChart unless otherwise noted below.     The patient was advised to call back or seek an in-person evaluation if the symptoms worsen or if the condition fails to improve as anticipated.    Halen Antenucci, FNP

## 2023-12-29 NOTE — Patient Instructions (Signed)

## 2024-01-26 ENCOUNTER — Telehealth: Payer: 59 | Admitting: Family Medicine

## 2024-01-26 ENCOUNTER — Telehealth: Payer: 59 | Admitting: Physician Assistant

## 2024-01-26 DIAGNOSIS — R051 Acute cough: Secondary | ICD-10-CM

## 2024-01-26 MED ORDER — BENZONATATE 100 MG PO CAPS: 100.0000 mg | ORAL_CAPSULE | Freq: Three times a day (TID) | ORAL | 0 refills | Status: DC | PRN

## 2024-01-26 NOTE — Progress Notes (Signed)
Virtual Visit Consent   Drew Jenkins, you are scheduled for a virtual visit with a [facility] provider today. Just as with appointments in the office, your consent must be obtained to participate. Your consent will be active for this visit and any virtual visit you may have with one of our providers in the next 365 days. If you have a MyChart account, a copy of this consent can be sent to you electronically.  As this is a virtual visit, video technology does not allow for your provider to perform a traditional examination. This may limit your provider's ability to fully assess your condition. If your provider identifies any concerns that need to be evaluated in person or the need to arrange testing (such as labs, EKG, etc.), we will make arrangements to do so. Although advances in technology are sophisticated, we cannot ensure that it will always work on either your end or our end. If the connection with a video visit is poor, the visit may have to be switched to a telephone visit. With either a video or telephone visit, we are not always able to ensure that we have a secure connection.  By engaging in this virtual visit, you consent to the provision of healthcare and authorize for your insurance to be billed (if applicable) for the services provided during this visit. Depending on your insurance coverage, you may receive a charge related to this service.  I need to obtain your verbal consent now. Are you willing to proceed with your visit today? [name] has provided verbal consent on 01/26/2024 for a virtual visit (video or telephone). Tylene Fantasia Ward, PA-C  Date: 01/26/2024 6:57 PM   Virtual Visit via Video Note   I, Tylene Fantasia Ward, connected with  Drew Jenkins  ([id number], [birth year]) on 01/26/24 at  7:00 PM EST by a video-enabled telemedicine application and verified that I am speaking with the correct person using two identifiers.  Location: Patient: Virtual Visit Location Patient:  Home Provider: Virtual Visit Location Provider: Home Office   I discussed the limitations of evaluation and management by telemedicine and the availability of in person appointments. The patient expressed understanding and agreed to proceed.    History of Present Illness: Drew Jenkins is a 58 y.o. who identifies as a male who was assigned male at birth, and is being seen today for persistent cough, reports started about one week ago.  He was sick with a uri in January, but reports these symptoms resolved. He denies fever, shortness of breath or wheezing.  Denies sinus pain or pressure or much congestion.  He is taking an over the counter cough syrup which is provided some relief.   HPI: HPI  Problems:  Patient Active Problem List   Diagnosis Date Noted   Moderate episode of recurrent major depressive disorder (HCC) 07/03/2023   Pure hypercholesterolemia 07/03/2023   Hemorrhoids 07/03/2023   Shortness of breath 04/30/2023   Vertigo 09/02/2022   Fatigue 02/24/2019   Medication monitoring encounter 03/11/2018   Screening examination for venereal disease 04/09/2017   Encounter for long-term (current) use of high-risk medication 04/09/2017   Depression 11/20/2016   GERD (gastroesophageal reflux disease) 11/20/2016   Human immunodeficiency virus I infection (HCC) 08/21/2016   Hypothyroidism, adult 08/21/2016   RLS (restless legs syndrome) 08/21/2016   H/O syphilis 08/21/2016   Overweight (BMI 25.0-29.9) 08/21/2016   Abnormal anal Papanicolaou smear 08/21/2016   Lumbago 08/21/2016    Allergies:  Allergies  Allergen  Reactions   Oxycodone Itching   Medications:  Current Outpatient Medications:    albuterol (VENTOLIN HFA) 108 (90 Base) MCG/ACT inhaler, SMARTSIG:2 Puff(s) By Mouth Every 4 Hours PRN, Disp: , Rfl:    BIKTARVY 50-200-25 MG TABS tablet, TAKE 1 TABLET BY MOUTH 1 TIME A DAY, Disp: 90 tablet, Rfl: 1   cetirizine (ZYRTEC ALLERGY) 10 MG tablet, Take 1 tablet (10 mg total) by  mouth daily., Disp: 30 tablet, Rfl: 0   Cyanocobalamin (VITAMIN B-12) 5000 MCG LOZG, Take 1,000 tablets by mouth daily., Disp: , Rfl:    FLUoxetine (PROZAC) 40 MG capsule, Take 1 capsule (40 mg total) by mouth daily., Disp: 90 capsule, Rfl: 2   gabapentin (NEURONTIN) 600 MG tablet, Take 1 tablet (600 mg total) by mouth 2 (two) times daily., Disp: 180 tablet, Rfl: 1   levothyroxine (SYNTHROID) 175 MCG tablet, Take 1 tablet (175 mcg total) by mouth daily., Disp: 90 tablet, Rfl: 3   meclizine (ANTIVERT) 25 MG tablet, Take 1 tablet (25 mg total) by mouth 3 (three) times daily as needed for dizziness., Disp: 30 tablet, Rfl: 1   Multiple Vitamin (MULTIVITAMIN) tablet, Take 1 tablet by mouth daily., Disp: , Rfl:    pantoprazole (PROTONIX) 20 MG tablet, Take 1 tablet (20 mg total) by mouth daily., Disp: 90 tablet, Rfl: 3  Observations/Objective: Patient is well-developed, well-nourished in no acute distress.  Resting comfortably at home.  Head is normocephalic, atraumatic.  No labored breathing.  Speech is clear and coherent with logical content.  Patient is alert and oriented at baseline.    Assessment and Plan: 1. Acute cough (Primary)  Pt afebrile with no shortness of breath or wheezing.  Supportive care discussed. If no improvement advised in person evaluation.  Follow Up Instructions: I discussed the assessment and treatment plan with the patient. The patient was provided an opportunity to ask questions and all were answered. The patient agreed with the plan and demonstrated an understanding of the instructions.  A copy of instructions were sent to the patient via MyChart unless otherwise noted below.     The patient was advised to call back or seek an in-person evaluation if the symptoms worsen or if the condition fails to improve as anticipated.    Tylene Fantasia Ward, PA-C

## 2024-01-26 NOTE — Progress Notes (Signed)
Because you had recent treatment for similar symptoms we need to have you seen in person for a lung exam your condition warrants further evaluation and I recommend that you be seen in a face-to-face visit at your PCP office or local urgent care.   NOTE: There will be NO CHARGE for this E-Visit

## 2024-01-26 NOTE — Patient Instructions (Signed)
Drew Jenkins, thank you for joining Drew Fantasia Ward, PA-C for today's virtual visit.  While this provider is not your primary care provider (PCP), if your PCP is located in our provider database this encounter information will be shared with them immediately following your visit.   A [name] account gives you access to today's visit and all your visits, tests, and labs performed at [name] " click here if you don't have a [name] or go to [email or web address]  Consent: (Patient) Drew Jenkins provided verbal consent for this virtual visit at the beginning of the encounter.  Current Medications:  Current Outpatient Medications:    benzonatate (TESSALON) 100 MG capsule, Take 1 capsule (100 mg total) by mouth 3 (three) times daily as needed., Disp: 20 capsule, Rfl: 0   albuterol (VENTOLIN HFA) 108 (90 Base) MCG/ACT inhaler, SMARTSIG:2 Puff(s) By Mouth Every 4 Hours PRN, Disp: , Rfl:    BIKTARVY 50-200-25 MG TABS tablet, TAKE 1 TABLET BY MOUTH 1 TIME A DAY, Disp: 90 tablet, Rfl: 1   cetirizine (ZYRTEC ALLERGY) 10 MG tablet, Take 1 tablet (10 mg total) by mouth daily., Disp: 30 tablet, Rfl: 0   Cyanocobalamin (VITAMIN B-12) 5000 MCG LOZG, Take 1,000 tablets by mouth daily., Disp: , Rfl:    FLUoxetine (PROZAC) 40 MG capsule, Take 1 capsule (40 mg total) by mouth daily., Disp: 90 capsule, Rfl: 2   gabapentin (NEURONTIN) 600 MG tablet, Take 1 tablet (600 mg total) by mouth 2 (two) times daily., Disp: 180 tablet, Rfl: 1   levothyroxine (SYNTHROID) 175 MCG tablet, Take 1 tablet (175 mcg total) by mouth daily., Disp: 90 tablet, Rfl: 3   meclizine (ANTIVERT) 25 MG tablet, Take 1 tablet (25 mg total) by mouth 3 (three) times daily as needed for dizziness., Disp: 30 tablet, Rfl: 1   Multiple Vitamin (MULTIVITAMIN) tablet, Take 1 tablet by mouth daily., Disp: , Rfl:    pantoprazole (PROTONIX) 20 MG tablet, Take 1 tablet (20 mg total) by mouth daily.,  Disp: 90 tablet, Rfl: 3   Medications ordered in this encounter:  Meds ordered this encounter  Medications   benzonatate (TESSALON) 100 MG capsule    Sig: Take 1 capsule (100 mg total) by mouth 3 (three) times daily as needed.    Dispense:  20 capsule    Refill:  0    Supervising Provider:   Merrilee Jansky [[id number]]     *If you need refills on other medications prior to your next appointment, please contact your pharmacy*  Follow-Up: Call back or seek an in-person evaluation if the symptoms worsen or if the condition fails to improve as anticipated.  [facility] [phone]  Other Instructions  Continue with Flonase once daily.  Can take the tessalon prescribed every 8 hours as needed. Drink plenty of fluids. If no improvement or symptoms become worse please be seen in person.    If you have been instructed to have an in-person evaluation today at a local Urgent Care facility, please use the link below. It will take you to a list of all of our available [facility], including address, phone number and hours of operation. Please do not delay care.  [facility]  If you or a family member do not have a primary care provider, use the link below to schedule a visit and establish care. When you choose a [facility] primary care physician or advanced practice provider,  you gain a long-term partner in health. Find a Primary Care Provider  Learn more about [facility] in-office and virtual care options: [facility] - Get Care Now

## 2024-03-23 ENCOUNTER — Other Ambulatory Visit: Payer: Self-pay | Admitting: Family Medicine

## 2024-03-23 DIAGNOSIS — G2581 Restless legs syndrome: Secondary | ICD-10-CM

## 2024-05-19 ENCOUNTER — Ambulatory Visit (INDEPENDENT_AMBULATORY_CARE_PROVIDER_SITE_OTHER): Admitting: Infectious Diseases

## 2024-05-19 ENCOUNTER — Other Ambulatory Visit: Payer: Self-pay

## 2024-05-19 ENCOUNTER — Encounter: Payer: Self-pay | Admitting: Infectious Diseases

## 2024-05-19 VITALS — BP 127/70 | HR 75 | Resp 16 | Ht 64.0 in | Wt 175.8 lb

## 2024-05-19 DIAGNOSIS — Z21 Asymptomatic human immunodeficiency virus [HIV] infection status: Secondary | ICD-10-CM | POA: Diagnosis not present

## 2024-05-19 DIAGNOSIS — B20 Human immunodeficiency virus [HIV] disease: Secondary | ICD-10-CM | POA: Insufficient documentation

## 2024-05-19 DIAGNOSIS — Z7185 Encounter for immunization safety counseling: Secondary | ICD-10-CM

## 2024-05-19 DIAGNOSIS — Z79899 Other long term (current) drug therapy: Secondary | ICD-10-CM | POA: Diagnosis not present

## 2024-05-19 DIAGNOSIS — Z113 Encounter for screening for infections with a predominantly sexual mode of transmission: Secondary | ICD-10-CM | POA: Diagnosis not present

## 2024-05-19 DIAGNOSIS — Z0000 Encounter for general adult medical examination without abnormal findings: Secondary | ICD-10-CM

## 2024-05-19 MED ORDER — BIKTARVY 50-200-25 MG PO TABS: ORAL_TABLET | ORAL | 3 refills | Status: AC

## 2024-05-19 NOTE — Progress Notes (Addendum)
 [address], [place], [place], [address]                                                                  Phn. [phone]; Fax: [phone]                                                                             Date: 05/19/24   Reason for Visit: Routine HIV care.  HPI: Drew Jenkins is a 58 y.o.old male with a history of HIV on Biktarvy  (first diagnosed in 2002 and started treatment in 2003 initially with Sustiva and Combivir, then Truvada, then Atripla, then Prezista, norvir and Truvada and  on Genvoya since 2017 until 12/26/19> Biktarvy  as a better alternative), HLD, Depression, RLS, GERD, Hypothyroidism , h/o syphilis who is here for regular fu. Patient previously followed by Dr Seymour Dapper.   Reports compliance with Biktarvy  without missed doses or concerns. He inquired about a potential cure for HIV, and discussed that so far no cure of HIV is known to my knowledge. Reports he has received both doses of the shingles vaccine at [facility], although only one dose is noted in his records. He lives alone and denies smoking, alcohol, or recreational drug use. He is not currently sexually active but has had male partners in the past. He regularly follows with PCP. Discussed about  colon ca screening. No other complaints.   ROS: As stated in above HPI; all other systems were reviewed and are otherwise negative unless noted below  No reported fever / chills, night sweats, unintentional weight loss, acute visual change, odynophagia, chest pain/pressure, new or worsened SOB or WOB, nausea, vomiting, diarrhea, dysuria, GU discharge, syncope, seizures, red/hot swollen joints, hallucinations / delusions, rashes, new allergies, unusual / excessive bleeding, swollen lymph nodes, or new hospitalizations/ED  visits/Urgent Care visits since the pt was last seen.  PMH/ PSH/ FamHx / Social Hx , medications and allergies reviewed and updated as appropriate; please see corresponding tab in EHR / prior notes                                        Current Outpatient Medications on File Prior to Visit  Medication Sig Dispense Refill   albuterol  (VENTOLIN  HFA) 108 (90 Base) MCG/ACT inhaler SMARTSIG:2 Puff(s) By Mouth Every 4 Hours PRN     benzonatate  (TESSALON ) 100 MG capsule Take 1 capsule (100 mg total) by mouth 3 (three) times daily as needed. 20 capsule 0   Cyanocobalamin (VITAMIN B-12) 5000 MCG LOZG Take 1,000 tablets  by mouth daily.     FLUoxetine  (PROZAC ) 40 MG capsule Take 1 capsule (40 mg total) by mouth daily. 90 capsule 2   gabapentin  (NEURONTIN ) 600 MG tablet TAKE 1 TABLET BY MOUTH TWICE A DAY 180 tablet 1   levothyroxine  (SYNTHROID ) 175 MCG tablet Take 1 tablet (175 mcg total) by mouth daily. 90 tablet 3   meclizine  (ANTIVERT ) 25 MG tablet Take 1 tablet (25 mg total) by mouth 3 (three) times daily as needed for dizziness. 30 tablet 1   Multiple Vitamin (MULTIVITAMIN) tablet Take 1 tablet by mouth daily.     pantoprazole  (PROTONIX ) 20 MG tablet Take 1 tablet (20 mg total) by mouth daily. 90 tablet 3   cetirizine  (ZYRTEC  ALLERGY) 10 MG tablet Take 1 tablet (10 mg total) by mouth daily. 30 tablet 0   No current facility-administered medications on file prior to visit.    Allergies  Allergen Reactions   Oxycodone  Itching   Past Medical History:  Diagnosis Date   HIV infection (HCC)    History reviewed. No pertinent surgical history.  Social History   Socioeconomic History   Marital status: Divorced    Spouse name: Not on file   Number of children: Not on file   Years of education: Not on file   Highest education level: 12th grade  Occupational History   Not on file  Tobacco Use   Smoking status: Never    Passive exposure: Past   Smokeless tobacco: Never  Vaping Use   Vaping  status: Never Used  Substance and Sexual Activity   Alcohol use: No   Drug use: No   Sexual activity: Not Currently    Comment: pt declined condoms  Other Topics Concern   Not on file  Social History Narrative   Not on file   Social Drivers of Health   Financial Resource Strain: Low Risk  (04/23/2023)   Overall Financial Resource Strain (CARDIA)    Difficulty of Paying Living Expenses: Not hard at all  Food Insecurity: Food Insecurity Present (04/23/2023)   Hunger Vital Sign    Worried About Running Out of Food in the Last Year: Never true    Ran Out of Food in the Last Year: Sometimes true  Transportation Needs: No Transportation Needs (04/23/2023)   PRAPARE - Administrator, Civil Service (Medical): No    Lack of Transportation (Non-Medical): No  Physical Activity: Unknown (04/23/2023)   Exercise Vital Sign    Days of Exercise per Week: 5 days    Minutes of Exercise per Session: Not on file  Stress: No Stress Concern Present (04/23/2023)   [employer] of Occupational Health - Occupational Stress Questionnaire    Feeling of Stress : Only a little  Social Connections: Moderately Isolated (04/23/2023)   Social Connection and Isolation Panel [NHANES]    Frequency of Communication with Friends and Family: More than three times a week    Frequency of Social Gatherings with Friends and Family: Patient declined    Attends Religious Services: 1 to 4 times per year    Active Member of [employer] or Organizations: No    Attends Engineer, structural: Not on file    Marital Status: Divorced  Catering manager Violence: Not on file   Family History  Problem Relation Age of Onset   Diabetes Mother    Arthritis/Rheumatoid Mother    Hypertension Father    Vitals BP 127/70   Pulse 75   Resp 16  Ht 5\' 4"  (1.626 m)   Wt 175 lb 12.8 oz (79.7 kg)   SpO2 98%   BMI 30.18 kg/m   Examination  Gen: no acute distress, hard of hearing HEENT: /AT, no scleral icterus, no pale  conjunctivae, hearing normal, oral mucosa moist Neck: Supple Cardio: Regular rate and rhythm, S1S2 Resp: Pulmonary effort normal in room air, Normal breath sounds GI: non distended, non tender and soft GU: Musc: Extremities: No pedal edema Skin: No rashes Neuro: grossly non focal , awake, alert and oriented * 3  Psych: Calm, cooperative  Lab Results HIV 1 RNA Quant  Date Value  04/14/2023 88 Copies/mL (H)  05/09/2022 35 copies/mL (H)  08/26/2021 <20 Copies/mL (H)   CD4 T Cell Abs (/uL)  Date Value  04/14/2023 1,107  08/26/2021 813  01/31/2021 896   Lab Results  Component Value Date   HIV1GENOSEQ REPORT 08/07/2016   Lab Results  Component Value Date   WBC 8.9 04/14/2023   HGB 15.5 04/14/2023   HCT 44.8 04/14/2023   MCV 89.1 04/14/2023   PLT 341 04/14/2023    Lab Results  Component Value Date   CREATININE 0.98 04/14/2023   BUN 14 04/14/2023   NA 141 04/14/2023   K 4.5 04/14/2023   CL 105 04/14/2023   CO2 29 04/14/2023   Lab Results  Component Value Date   ALT 21 04/14/2023   AST 23 04/14/2023   ALKPHOS 59 08/18/2022   BILITOT 1.4 (H) 04/14/2023    Lab Results  Component Value Date   CHOL 181 07/10/2023   TRIG 165.0 (H) 07/10/2023   HDL 36.20 (L) 07/10/2023   LDLCALC 112 (H) 07/10/2023   Lab Results  Component Value Date   HAV REACTIVE (A) 08/07/2016   Lab Results  Component Value Date   HEPBSAG NEGATIVE 08/07/2016   HEPBSAB POS (A) 08/07/2016   Lab Results  Component Value Date   HCVAB NEGATIVE 08/07/2016   Lab Results  Component Value Date   CHLAMYDIAWP Negative 08/26/2021   N Negative 08/26/2021   No results found for: "GCPROBEAPT" Lab Results  Component Value Date   QUANTGOLD NEGATIVE 08/07/2016    Health Maintenance: Immunization History  Administered Date(s) Administered   DTaP 03/18/2011   Hep A / Hep B 04/22/2011, 05/29/2015   Hpv-Unspecified 10/07/2021   Influenza Inj Mdck Quad Pf 09/14/2019   Influenza Split 08/30/2019,  09/27/2023   Influenza,inj,Quad PF,6+ Mos 09/02/2018   Influenza-Unspecified 09/02/2018, 08/30/2019, 11/09/2020   MODERNA COVID-19 SARS-COV-2 PEDS BIVALENT BOOSTER 83yr-85yr 09/27/2023   Meningococcal Conjugate 09/02/2018   Meningococcal Mcv4o 09/02/2018   PFIZER(Purple Top)SARS-COV-2 Vaccination 02/11/2020, 03/03/2020, 11/09/2020   PNEUMOCOCCAL CONJUGATE-20 09/27/2023   PPD Test 10/10/2016   Pfizer Covid-19 Vaccine Bivalent Booster 58yrs & up 10/11/2021   Pneumococcal Conjugate-13 12/15/2001   Pneumococcal Polysaccharide-23 02/22/2021   Tdap 04/25/2013   Zoster Recombinant(Shingrix) 06/07/2021    Assessment/Plan: # HIV - continue PO biktarvy   - labs today  - fu in 1 year per patient preference  # HLD/Hypothyroidism/Depression - on meds - Follows with PCP  # STD Screening/Hx syphilis - Urine GC and RPR  # Immunization  - deferred today  - Reports receipt of 2 doses of shingles vaccine   #Health maintenance - lipid panel  - h/o anal dysplasia, unclear fu - discussed colon ca screening, cologuard ordered but not collected in 07/03/23  Patient's labs were reviewed as well as his previous records. Patients questions were addressed and answered. Safe sex counseling done.  I spent 38  minutes involved in face-to-face and non-face-to-face activities for this patient on the day of the visit. Professional time spent includes the following activities: Preparing to see the patient (review of tests), Obtaining and reviewing separately obtained history (prior notes from Dr Seymour Dapper, last PCP note), Performing a medically appropriate examination and evaluation , Ordering medications/labs, Documenting clinical information in the EMR, Independently interpreting results (not separately reported), Communicating results to the patient, Counseling and educating the patient and Care coordination (not separately reported).   Of note, portions of this note may have been created with voice recognition  software. While this note has been edited for accuracy, occasional wrong-word or 'sound-a-like' substitutions may have occurred due to the inherent limitations of voice recognition software.   Electronically signed by:  Terre Ferri, MD Infectious Disease Physician [facility] [address] [place], [place] [address] Phone: [phone]  Fax: [phone]

## 2024-05-20 LAB — C. TRACHOMATIS/N. GONORRHOEAE RNA
C. trachomatis RNA, TMA: NOT DETECTED
N. gonorrhoeae RNA, TMA: NOT DETECTED

## 2024-05-21 DIAGNOSIS — Z7185 Encounter for immunization safety counseling: Secondary | ICD-10-CM | POA: Insufficient documentation

## 2024-05-23 LAB — CBC
HCT: 42.3 % (ref 38.5–50.0)
Hemoglobin: 14.5 g/dL (ref 13.2–17.1)
MCH: 30.9 pg (ref 27.0–33.0)
MCHC: 34.3 g/dL (ref 32.0–36.0)
MCV: 90 fL (ref 80.0–100.0)
MPV: 9.4 fL (ref 7.5–12.5)
Platelets: 302 10*3/uL (ref 140–400)
RBC: 4.7 10*6/uL (ref 4.20–5.80)
RDW: 12.4 % (ref 11.0–15.0)
WBC: 7 10*3/uL (ref 3.8–10.8)

## 2024-05-23 LAB — COMPREHENSIVE METABOLIC PANEL WITH GFR
AG Ratio: 1.6 (calc) (ref 1.0–2.5)
ALT: 25 U/L (ref 9–46)
AST: 24 U/L (ref 10–35)
Albumin: 4.2 g/dL (ref 3.6–5.1)
Alkaline phosphatase (APISO): 61 U/L (ref 35–144)
BUN: 11 mg/dL (ref 7–25)
CO2: 26 mmol/L (ref 20–32)
Calcium: 9.4 mg/dL (ref 8.6–10.3)
Chloride: 103 mmol/L (ref 98–110)
Creat: 0.88 mg/dL (ref 0.70–1.30)
Globulin: 2.6 g/dL (ref 1.9–3.7)
Glucose, Bld: 87 mg/dL (ref 65–99)
Potassium: 3.7 mmol/L (ref 3.5–5.3)
Sodium: 137 mmol/L (ref 135–146)
Total Bilirubin: 1.1 mg/dL (ref 0.2–1.2)
Total Protein: 6.8 g/dL (ref 6.1–8.1)
eGFR: 99 mL/min/{1.73_m2} (ref >=60)

## 2024-05-23 LAB — LIPID PANEL
Cholesterol: 152 mg/dL (ref <200)
HDL: 36 mg/dL — ABNORMAL LOW (ref >=40)
LDL Cholesterol (Calc): 79 mg/dL
Non-HDL Cholesterol (Calc): 116 mg/dL (ref <130)
Total CHOL/HDL Ratio: 4.2 (calc) (ref <5.0)
Triglycerides: 279 mg/dL — ABNORMAL HIGH (ref <150)

## 2024-05-23 LAB — HIV RNA, RTPCR W/R GT (RTI, PI,INT)
HIV 1 RNA Quant: NOT DETECTED {copies}/mL
HIV-1 RNA Quant, Log: NOT DETECTED {Log_copies}/mL

## 2024-05-23 LAB — T-HELPER CELLS (CD4) COUNT (NOT AT ARMC)
Absolute CD4: 1132 {cells}/uL (ref 490–1740)
CD4 T Helper %: 31 % (ref 30–61)
Total lymphocyte count: 3633 {cells}/uL (ref 850–3900)

## 2024-05-23 LAB — RPR TITER: RPR Titer: 1:1 {titer} — ABNORMAL HIGH

## 2024-05-23 LAB — RPR: RPR Ser Ql: REACTIVE — AB

## 2024-05-23 LAB — T PALLIDUM AB: T Pallidum Abs: POSITIVE — AB

## 2024-05-25 ENCOUNTER — Ambulatory Visit: Payer: Self-pay | Admitting: Infectious Diseases

## 2024-05-25 DIAGNOSIS — Z21 Asymptomatic human immunodeficiency virus [HIV] infection status: Secondary | ICD-10-CM

## 2024-05-25 DIAGNOSIS — E7800 Pure hypercholesterolemia, unspecified: Secondary | ICD-10-CM

## 2024-05-25 MED ORDER — ATORVASTATIN CALCIUM 10 MG PO TABS: 10.0000 mg | ORAL_TABLET | Freq: Every day | ORAL | 5 refills | Status: DC

## 2024-05-25 NOTE — Addendum Note (Signed)
 Addended by: Arlon Bergamo D on: 05/25/2024 04:33 PM   Modules accepted: Orders

## 2024-05-25 NOTE — Telephone Encounter (Signed)
 Called patient to relay results and discuss statin therapy, no answer. Left HIPAA compliant voicemail requesting callback.   Drew Jenkins, BSN, RN

## 2024-06-15 ENCOUNTER — Encounter: Payer: Self-pay | Admitting: Family Medicine

## 2024-06-15 DIAGNOSIS — F331 Major depressive disorder, recurrent, moderate: Secondary | ICD-10-CM

## 2024-06-18 LAB — COLOGUARD: COLOGUARD: NEGATIVE

## 2024-06-20 ENCOUNTER — Other Ambulatory Visit: Payer: Self-pay | Admitting: Family Medicine

## 2024-06-20 DIAGNOSIS — F331 Major depressive disorder, recurrent, moderate: Secondary | ICD-10-CM

## 2024-06-20 NOTE — Telephone Encounter (Signed)
Last Ov

## 2024-06-21 ENCOUNTER — Other Ambulatory Visit: Payer: Self-pay | Admitting: Family Medicine

## 2024-06-21 DIAGNOSIS — E039 Hypothyroidism, unspecified: Secondary | ICD-10-CM

## 2024-06-23 ENCOUNTER — Other Ambulatory Visit: Payer: Self-pay | Admitting: Family Medicine

## 2024-06-23 DIAGNOSIS — F331 Major depressive disorder, recurrent, moderate: Secondary | ICD-10-CM

## 2024-06-23 MED ORDER — FLUOXETINE HCL 40 MG PO CAPS
40.0000 mg | ORAL_CAPSULE | Freq: Every day | ORAL | 2 refills | Status: DC
Start: 2024-06-23 — End: 2024-09-22

## 2024-06-23 NOTE — Addendum Note (Signed)
 Addended by: LENON ROUGHEN on: 06/23/2024 01:54 PM   Modules accepted: Orders

## 2024-06-23 NOTE — Addendum Note (Signed)
 Addended by: LENON ROUGHEN on: 06/23/2024 01:55 PM   Modules accepted: Orders

## 2024-07-01 ENCOUNTER — Ambulatory Visit: Admitting: Family Medicine

## 2024-07-01 ENCOUNTER — Encounter: Payer: Self-pay | Admitting: Family Medicine

## 2024-07-01 VITALS — BP 126/68 | HR 69 | Temp 97.8°F | Ht 64.0 in | Wt 176.0 lb

## 2024-07-01 DIAGNOSIS — G8929 Other chronic pain: Secondary | ICD-10-CM

## 2024-07-01 DIAGNOSIS — E039 Hypothyroidism, unspecified: Secondary | ICD-10-CM | POA: Diagnosis not present

## 2024-07-01 DIAGNOSIS — M4722 Other spondylosis with radiculopathy, cervical region: Secondary | ICD-10-CM

## 2024-07-01 DIAGNOSIS — M25512 Pain in left shoulder: Secondary | ICD-10-CM

## 2024-07-01 DIAGNOSIS — F331 Major depressive disorder, recurrent, moderate: Secondary | ICD-10-CM | POA: Diagnosis not present

## 2024-07-01 DIAGNOSIS — M19111 Post-traumatic osteoarthritis, right shoulder: Secondary | ICD-10-CM

## 2024-07-01 DIAGNOSIS — M4712 Other spondylosis with myelopathy, cervical region: Secondary | ICD-10-CM

## 2024-07-01 DIAGNOSIS — R5382 Chronic fatigue, unspecified: Secondary | ICD-10-CM

## 2024-07-01 DIAGNOSIS — G2581 Restless legs syndrome: Secondary | ICD-10-CM

## 2024-07-01 DIAGNOSIS — S46001S Unspecified injury of muscle(s) and tendon(s) of the rotator cuff of right shoulder, sequela: Secondary | ICD-10-CM

## 2024-07-01 DIAGNOSIS — K219 Gastro-esophageal reflux disease without esophagitis: Secondary | ICD-10-CM

## 2024-07-01 MED ORDER — TIZANIDINE HCL 4 MG PO TABS: 4.0000 mg | ORAL_TABLET | Freq: Four times a day (QID) | ORAL | 3 refills | Status: AC | PRN

## 2024-07-01 MED ORDER — CELECOXIB 200 MG PO CAPS: 200.0000 mg | ORAL_CAPSULE | Freq: Two times a day (BID) | ORAL | 3 refills | Status: DC | PRN

## 2024-07-01 NOTE — Progress Notes (Signed)
 Assessment & Plan   Assessment/Plan:   Assessment and Plan Assessment & Plan Chronic Shoulder and Neck Pain Drew Jenkins presents with chronic shoulder and neck pain, primarily affecting the left shoulder but also involving the right shoulder and neck. The pain is constant, exacerbated by movement and at night, and unresponsive to ibuprofen or gabapentin . Previous MRI indicated cervical spondylosis, rotator cuff issues, and nerve compression. Differential diagnosis includes cervical involvement or rotator cuff injury. Further evaluation is warranted due to the duration and severity of symptoms. Discussed imaging, physical therapy, and orthopedic referral for evaluation and treatment, including cortisone injections. Drew Jenkins declined immediate pain relief injection but agreed to try Celebrex and tizanidine for pain management. - Prescribe Celebrex 200 mg twice daily for pain management. - Refer to orthopedics for evaluation and potential cortisone injection. - Consider physical therapy if recommended by orthopedics. - Order shoulder X-ray. - Prescribe tizanidine as an alternative muscle relaxer.  Fatigue and Low Energy Drew Jenkins reports significant fatigue and low energy despite vitamin B12 supplementation. Suspected mood disorder such as depression or anxiety may contribute to these symptoms. He denies suicidal ideation but acknowledges anxiety attacks. Plan to further evaluate mood and energy levels. - Administer mood assessment questionnaire. - Discuss mood and energy levels at follow-up visit.  Hypothyroidism Drew Jenkins inquired about thyroid  function due to fatigue and low energy. Previous lab work did not include a thyroid  panel. Agreed to check thyroid  levels to rule out dysfunction as a contributing factor. - Order thyroid  panel.  General Health Maintenance Drew Jenkins completed a Cologuard test recently, which returned negative as part of routine colorectal cancer screening. - Continue routine health  maintenance and screenings as appropriate.  Follow-up Plan to reassess shoulder pain, mood, and energy levels in two weeks. - Schedule follow-up appointment in two weeks to evaluate shoulder pain, mood, and energy levels.      There are no discontinued medications.  Return in about 2 weeks (around 07/15/2024) for fatigue.        Subjective:   Encounter date: 07/01/2024  Drew Jenkins is a 58 y.o. male who has Human immunodeficiency virus I infection (HCC); Hypothyroidism, adult; RLS (restless legs syndrome); H/O syphilis; Overweight (BMI 25.0-29.9); Abnormal anal Papanicolaou smear; Lumbago; Depression; GERD (gastroesophageal reflux disease); Screening examination for venereal disease; Encounter for long-term (current) use of high-risk medication; Medication monitoring encounter; Fatigue; Vertigo; Shortness of breath; Moderate episode of recurrent major depressive disorder (HCC); Pure hypercholesterolemia; Hemorrhoids; HIV disease (HCC); Medication management; Health care maintenance; and Immunization counseling on their problem list..   He  has a past medical history of HIV infection (HCC).[name]   He presents with chief complaint of Shoulder Pain (L shoulder; sometimes the R but L is worse and sometimes radiates down L arm causing it to throb; Going on for the past few months; makes it hard to sleep; sharp constant pain; nothing really helps with pain (pain today is 9/10)) .   Discussed the use of AI scribe software for clinical note transcription with the patient, who gave verbal consent to proceed.  History of Present Illness Drew Jenkins is a 58 year old male with cervical spondylosis who presents with persistent shoulder and neck pain.  He has been experiencing persistent neck and shoulder pain for the past few months, severe enough to disrupt his sleep. The pain is primarily located in the left shoulder but also affects the right shoulder and neck. It is described as constant  and worsens with movement, particularly troublesome at night. He  has difficulty raising his arm due to the pain.  His medical history includes cervical spondylosis and rotator cuff issues. Previous MRI findings showed cervical spondylosis and rotator cuff problems. An MRI from 2022 showed cervical spondylosis and a rotator cuff issue in the right shoulder.  He has been using ibuprofen 800 mg twice daily and Icy Hot roll-on for pain relief, but these have not been effective. He previously used gabapentin  for restless legs, which did not provide relief, and he has since stopped taking it. For restless legs, he takes two teaspoons of mustard at night, which he found as a remedy online.  He reports that the pain is so severe at times that it feels like he can 'taste almost like metal.' He received a cortisone shot in his right shoulder in 2018, which provided significant relief at that time.  He also takes 5000 mg of vitamin B12 but feels he has no energy and lacks motivation to engage in activities, preferring to sit and watch TV. No suicidal ideation but acknowledges experiencing anxiety attacks.     ROS  History reviewed. No pertinent surgical history.  Outpatient Medications Prior to Visit  Medication Sig Dispense Refill   albuterol  (VENTOLIN  HFA) 108 (90 Base) MCG/ACT inhaler SMARTSIG:2 Puff(s) By Mouth Every 4 Hours PRN     atorvastatin  (LIPITOR) 10 MG tablet Take 1 tablet (10 mg total) by mouth daily. 30 tablet 5   benzonatate  (TESSALON ) 100 MG capsule Take 1 capsule (100 mg total) by mouth 3 (three) times daily as needed. 20 capsule 0   bictegravir-emtricitabine-tenofovir AF (BIKTARVY ) 50-200-25 MG TABS tablet TAKE 1 TABLET BY MOUTH 1 TIME A DAY. 90 tablet 3   Cyanocobalamin (VITAMIN B-12) 5000 MCG LOZG Take 1,000 tablets by mouth daily.     FLUoxetine  (PROZAC ) 40 MG capsule Take 1 capsule (40 mg total) by mouth daily. 90 capsule 2   gabapentin  (NEURONTIN ) 600 MG tablet TAKE 1 TABLET BY  MOUTH TWICE A DAY 180 tablet 1   levothyroxine  (SYNTHROID ) 175 MCG tablet TAKE 1 TABLET BY MOUTH EVERY DAY 90 tablet 3   meclizine  (ANTIVERT ) 25 MG tablet Take 1 tablet (25 mg total) by mouth 3 (three) times daily as needed for dizziness. 30 tablet 1   Multiple Vitamin (MULTIVITAMIN) tablet Take 1 tablet by mouth daily.     pantoprazole  (PROTONIX ) 20 MG tablet Take 1 tablet (20 mg total) by mouth daily. 90 tablet 3   cetirizine  (ZYRTEC  ALLERGY) 10 MG tablet Take 1 tablet (10 mg total) by mouth daily. 30 tablet 0   No facility-administered medications prior to visit.    Family History  Problem Relation Age of Onset   Diabetes Mother    Arthritis/Rheumatoid Mother    Hypertension Father     Social History   Socioeconomic History   Marital status: Divorced    Spouse name: Not on file   Number of children: Not on file   Years of education: Not on file   Highest education level: 12th grade  Occupational History   Not on file  Tobacco Use   Smoking status: Never    Passive exposure: Past   Smokeless tobacco: Never  Vaping Use   Vaping status: Never Used  Substance and Sexual Activity   Alcohol use: No   Drug use: No   Sexual activity: Not Currently    Comment: pt declined condoms  Other Topics Concern   Not on file  Social History Narrative   Not on file  Social Drivers of Corporate investment banker Strain: Low Risk  (06/28/2024)   Overall Financial Resource Strain (CARDIA)    Difficulty of Paying Living Expenses: Not hard at all  Food Insecurity: No Food Insecurity (06/28/2024)   Hunger Vital Sign    Worried About Running Out of Food in the Last Year: Never true    Ran Out of Food in the Last Year: Never true  Transportation Needs: No Transportation Needs (06/28/2024)   PRAPARE - Administrator, Civil Service (Medical): No    Lack of Transportation (Non-Medical): No  Physical Activity: Inactive (06/28/2024)   Exercise Vital Sign    Days of Exercise per  Week: 0 days    Minutes of Exercise per Session: Not on file  Stress: Stress Concern Present (06/28/2024)   [employer] of Occupational Health - Occupational Stress Questionnaire    Feeling of Stress: Very much  Social Connections: Moderately Isolated (06/28/2024)   Social Connection and Isolation Panel    Frequency of Communication with Friends and Family: More than three times a week    Frequency of Social Gatherings with Friends and Family: Patient declined    Attends Religious Services: 1 to 4 times per year    Active Member of [employer] or Organizations: No    Attends Engineer, structural: Not on file    Marital Status: Divorced  Catering manager Violence: Not on file                                                                                                  Objective:  Physical Exam: BP 126/68   Pulse 69   Temp 97.8 F (36.6 C)   Ht 5' 4 (1.626 m)   Wt 176 lb (79.8 kg)   SpO2 96%   BMI 30.21 kg/m    Physical Exam GENERAL: Alert, cooperative, well developed, no acute distress. HEENT: Normocephalic, normal oropharynx, moist mucous membranes. CHEST: Clear to auscultation bilaterally. No wheezes, rhonchi, or crackles. CARDIOVASCULAR: Normal heart rate and rhythm. S1 and S2 normal without murmurs. ABDOMEN: Soft, non-tender, non-distended, without organomegaly. Normal bowel sounds. EXTREMITIES: No cyanosis or edema. MUSCULOSKELETAL: Good strength but pain slows movement in shoulder. Tenderness in shoulder on palpation. Tenderness in axilla. NEUROLOGICAL: Cranial nerves grossly intact. Moves all extremities without gross motor or sensory deficit.   Physical Exam  No results found.  Recent Results (from the past 2160 hours)  CBC     Status: None   Collection Time: 05/19/24  3:37 PM  Result Value Ref Range   WBC 7.0 3.8 - 10.8 Thousand/uL   RBC 4.70 4.20 - 5.80 Million/uL   Hemoglobin 14.5 13.2 - 17.1 g/dL   HCT 57.6 61.4 - 49.9 %   MCV 90.0 80.0 -  100.0 fL   MCH 30.9 27.0 - 33.0 pg   MCHC 34.3 32.0 - 36.0 g/dL    Comment: For adults, a slight decrease in the calculated MCHC value (in the range of 30 to 32 g/dL) is most likely not clinically significant; however, it should be interpreted with caution in correlation  with other red cell parameters and the patient's clinical condition.    RDW 12.4 11.0 - 15.0 %   Platelets 302 140 - 400 Thousand/uL   MPV 9.4 7.5 - 12.5 fL  Lipid panel     Status: Abnormal   Collection Time: 05/19/24  3:37 PM  Result Value Ref Range   Cholesterol 152 <200 mg/dL   HDL 36 (L) > OR = 40 mg/dL   Triglycerides 720 (H) <150 mg/dL    Comment: . If a non-fasting specimen was collected, consider repeat triglyceride testing on a fasting specimen if clinically indicated.  Veatrice et al. J. of Clin. Lipidol. 2015;9:129-169. [name]    LDL Cholesterol (Calc) 79 mg/dL (calc)    Comment: Reference range: <100 . Desirable range <100 mg/dL for primary prevention;   <70 mg/dL for patients with CHD or diabetic patients  with > or = 2 CHD risk factors. [name] LDL-C is now calculated using the Martin-Hopkins  calculation, which is a validated novel method providing  better accuracy than the Friedewald equation in the  estimation of LDL-C.  Gladis APPLETHWAITE et al. SANDREA. 7986;689(80): 2061-2068  (http://education.[facility].com/faq/FAQ164)    Total CHOL/HDL Ratio 4.2 <5.0 (calc)   Non-HDL Cholesterol (Calc) 116 <130 mg/dL (calc)    Comment: For patients with diabetes plus 1 major ASCVD risk  factor, treating to a non-HDL-C goal of <100 mg/dL  (LDL-C of <29 mg/dL) is considered a therapeutic  option.   RPR     Status: Abnormal   Collection Time: 05/19/24  3:37 PM  Result Value Ref Range   RPR Ser Ql REACTIVE (A) NON-REACTIVE  T-helper cells (CD4) count (not at [facility])     Status: None   Collection Time: 05/19/24  3:37 PM  Result Value Ref Range   CD4 T Helper % 31 30 - 61 %   Absolute CD4 1,132 490 - 1,740  cells/uL   Total lymphocyte count 3,633 850 - 3,900 cells/uL    Comment: The analytical performance characteristics of this assay  have been determined by [employer]. The  modifications have not been cleared or approved by the  FDA. This assay has been validated pursuant to the CLIA  regulations and is used for clinical purposes.   HIV RNA, RTPCR W/R GT (RTI, PI,INT)     Status: None   Collection Time: 05/19/24  3:37 PM  Result Value Ref Range   HIV 1 RNA Quant NOT DETECTED copies/mL   HIV-1 RNA Quant, Log NOT DETECTED Log copies/mL    Comment: REFERENCE RANGE:           NOT DETECTED  copies/mL           NOT DETECTED  Log copies/mL . This test was performed using Real-Time Polymerase Chain Reaction. . Reportable range is 20 to 10,000,000 copies/mL (1.30-7.00 Log copies/mL).   Comprehensive metabolic panel with GFR     Status: None   Collection Time: 05/19/24  3:37 PM  Result Value Ref Range   Glucose, Bld 87 65 - 99 mg/dL    Comment: .            Fasting reference interval .    BUN 11 7 - 25 mg/dL   Creat 9.11 9.29 - 8.69 mg/dL   eGFR 99 > OR = 60 fO/fpw/8.26f7   BUN/Creatinine Ratio SEE NOTE: 6 - 22 (calc)    Comment:    Not Reported: BUN and Creatinine are within    reference range. [name]  Sodium 137 135 - 146 mmol/L   Potassium 3.7 3.5 - 5.3 mmol/L   Chloride 103 98 - 110 mmol/L   CO2 26 20 - 32 mmol/L   Calcium  9.4 8.6 - 10.3 mg/dL   Total Protein 6.8 6.1 - 8.1 g/dL   Albumin 4.2 3.6 - 5.1 g/dL   Globulin 2.6 1.9 - 3.7 g/dL (calc)   AG Ratio 1.6 1.0 - 2.5 (calc)   Total Bilirubin 1.1 0.2 - 1.2 mg/dL   Alkaline phosphatase (APISO) 61 35 - 144 U/L   AST 24 10 - 35 U/L   ALT 25 9 - 46 U/L  Rpr titer     Status: Abnormal   Collection Time: 05/19/24  3:37 PM  Result Value Ref Range   RPR Titer 1:1 (H)   T PALLIDUM AB     Status: Abnormal   Collection Time: 05/19/24  3:37 PM  Result Value Ref Range   T Pallidum Abs POSITIVE (A) NEGATIVE    Comment:  . Nontreponemal and treponemal antibodies detected. Consistent with past or current syphilis. Clinical evaluation should be performed to identify signs, symptoms, or history of past infection or treatment.   C. trachomatis/N. gonorrhoeae RNA     Status: None   Collection Time: 05/19/24  3:46 PM   Specimen: Urine  Result Value Ref Range   C. trachomatis RNA, TMA NOT DETECTED NOT DETECTED   N. gonorrhoeae RNA, TMA NOT DETECTED NOT DETECTED    Comment: The analytical performance characteristics of this assay, when used to test SurePath(TM) specimens have been determined by [employer]. The modifications have not been cleared or approved by the FDA. This assay has been validated pursuant to the CLIA regulations and is used for clinical purposes. . For additional information, please refer to https://education.[facility].com/faq/FAQ154 (This link is being provided for information/ educational purposes only.) .   COLOGUARD     Status: Normal   Collection Time: 06/12/24 12:45 PM  Result Value Ref Range   COLOGUARD Negative Negative    Comment:  The Cologuard (TM) test was performed on this specimen.  NEGATIVE TEST RESULT. A negative Cologuard result indicates a low likelihood that a colorectal cancer (CRC) or advanced adenoma (adenomatous polyps with more advanced pre-malignant features) is present. The chance that a person with a negative Cologuard test has a colorectal cancer is less than 1 in 1500 (negative predictive value >99.9%) or has an advanced adenoma is less than 5.3% (negative predictive value 94.7%). These data are based on a prospective cross-sectional study of 10,000 individuals at average risk for colorectal cancer who were screened with both Cologuard and colonoscopy. (Imperiale T. et al, N Engl J Med 2014;370(14):1286-1297) The normal value (reference range) for this assay is negative.  COLOGUARD RE-SCREENING RECOMMENDATION: Periodic colorectal cancer screening  is an important part of preventive healthcare for asymptomatic individuals at average risk for colorectal cancer. Following a negative Cologuard result, the American Cancer Society and U.S. Multi-Society Task Force screening guidelines recommend a Cologuard re-screening interval of 3 years.  References: American Cancer Society Guideline for Colorectal Cancer Screening: https://www.cancer.org/cancer/colon-rectal-cancer/detection-diagnosis-staging/acs-recommendations.html.; Rex DK, Boland CR, Dominitz JK, Colorectal Cancer Screening: Recommendations for Physicians and Patients from the U.S. Multi-Society Task Force on Colorectal Cancer Screening , Am J Gastroenterology 2017; 112:1016-1030.  TEST DESCRIPTION: Composite algorithmic analysis of stool DNA-biomarkers with hemoglobin immunoassay.   Quantitative values of individual biomarkers are not reportable and are not associated with individual biomarker result reference ranges. Cologuard is intended for colorectal cancer screening of  adults of either sex, 45 years or older, who are at average-risk for colorectal cancer (CRC). Cologuard has been approved for use by the U.S. FDA. The performance of Cologuard was established in a cross sectional study of average-risk adults aged 74-84. Cologuard performance in patients ages 48 to 60 years was estimated by sub-group analysis of near-age groups. Colonoscopies performed for a positive result may find as the most clinically significant lesion: colorectal cancer [4.0%], advanced adenoma (including sessile serrated polyps greater than or equal to 1cm diameter) [20%] or non- advanced adenoma [31%]; or no colorectal neoplasia [45%]. These estimates are derived from a prospective  cross-sectional screening study of 10,000 individuals at average risk for colorectal cancer who were screened with both Cologuard and colonoscopy. (Imperiale T. et al, LOISE [place] J Med 2014;370(14):1286-1297.) Cologuard may produce a false negative or  false positive result (no colorectal cancer or precancerous polyp present at colonoscopy follow up). A negative Cologuard test result does not guarantee the absence of CRC or advanced adenoma (pre-cancer). The current Cologuard screening interval is every 3 years. Science writer and U.[place]. Therapist, music). Cologuard performance data in a 10,000 patient pivotal study using colonoscopy as the reference method can be accessed at the following location: www.exactlabs.com/results. Additional description of the Cologuard test process, warnings and precautions can be found at www.cologuard.com.        Beverley Adine Hummer, MD, MS

## 2024-07-01 NOTE — Patient Instructions (Signed)
  VISIT SUMMARY: Today, you were seen for persistent shoulder and neck pain that has been affecting your sleep and daily activities. We discussed your history of cervical spondylosis and rotator cuff issues, and reviewed your current symptoms and treatments.  YOUR PLAN: -CHRONIC SHOULDER AND NECK PAIN: Chronic shoulder and neck pain can be caused by conditions such as cervical spondylosis or rotator cuff injuries. We will start you on Celebrex 200 mg twice daily for pain management and refer you to an orthopedic specialist for further evaluation and potential cortisone injection. A shoulder X-ray has been ordered, and tizanidine has been prescribed as an alternative muscle relaxer.  -FATIGUE AND LOW ENERGY: Fatigue and low energy can be symptoms of mood disorders like depression or anxiety. We will administer a mood assessment questionnaire and discuss your mood and energy levels at your follow-up visit.  -THYROID  FUNCTION EVALUATION: Thyroid  dysfunction can contribute to fatigue and low energy. We will check your thyroid  levels to rule out any thyroid  issues.  -GENERAL HEALTH MAINTENANCE: Your recent Cologuard test for colorectal cancer screening was negative. Continue with routine health maintenance and screenings as appropriate.  INSTRUCTIONS: Please schedule a follow-up appointment in two weeks to reassess your shoulder pain, mood, and energy levels. Additionally, follow up with the orthopedic specialist as referred.

## 2024-07-02 LAB — TSH RFX ON ABNORMAL TO FREE T4: TSH: 2.03 u[IU]/mL (ref 0.450–4.500)

## 2024-07-03 ENCOUNTER — Ambulatory Visit: Payer: Self-pay | Admitting: Family Medicine

## 2024-07-22 ENCOUNTER — Encounter: Payer: Self-pay | Admitting: Family Medicine

## 2024-07-22 ENCOUNTER — Ambulatory Visit: Admitting: Family Medicine

## 2024-07-22 VITALS — BP 122/68 | HR 65 | Temp 98.0°F | Ht 64.0 in | Wt 173.0 lb

## 2024-07-22 DIAGNOSIS — G9332 Myalgic encephalomyelitis/chronic fatigue syndrome: Secondary | ICD-10-CM

## 2024-07-22 DIAGNOSIS — M542 Cervicalgia: Secondary | ICD-10-CM

## 2024-07-22 DIAGNOSIS — R42 Dizziness and giddiness: Secondary | ICD-10-CM

## 2024-07-22 DIAGNOSIS — M25511 Pain in right shoulder: Secondary | ICD-10-CM

## 2024-07-22 DIAGNOSIS — G8929 Other chronic pain: Secondary | ICD-10-CM

## 2024-07-22 DIAGNOSIS — R5382 Chronic fatigue, unspecified: Secondary | ICD-10-CM

## 2024-07-22 DIAGNOSIS — R3121 Asymptomatic microscopic hematuria: Secondary | ICD-10-CM

## 2024-07-22 DIAGNOSIS — N069 Isolated proteinuria with unspecified morphologic lesion: Secondary | ICD-10-CM

## 2024-07-22 DIAGNOSIS — M25512 Pain in left shoulder: Secondary | ICD-10-CM

## 2024-07-22 LAB — POC URINALSYSI DIPSTICK (AUTOMATED)
Bilirubin, UA: NEGATIVE
Glucose, UA: NEGATIVE
Ketones, UA: NEGATIVE
Leukocytes, UA: NEGATIVE
Nitrite, UA: NEGATIVE
Protein, UA: POSITIVE — AB
Spec Grav, UA: 1.02 (ref 1.010–1.025)
Urobilinogen, UA: 0.2 U/dL
pH, UA: 6 (ref 5.0–8.0)

## 2024-07-22 MED ORDER — METHYLPHENIDATE HCL 10 MG PO TABS: ORAL_TABLET | ORAL | 0 refills | Status: DC

## 2024-07-22 NOTE — Patient Instructions (Signed)
  VISIT SUMMARY: Today, you were seen for worsening chronic shoulder pain and new tingling in your right hand. We discussed your persistent shoulder pain, which now affects both sides and sometimes radiates down your arms, as well as your new symptom of tingling in your right hand. You also mentioned experiencing chronic fatigue and a recent fall due to balance issues. We reviewed your current medications and past treatments, including an MRI from 2022 that showed disc issues in your neck.  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 We will refer you to an orthopedic specialist for further evaluation and management. Continue taking tizanidine  to help with sleep.  -CHRONIC FATIGUE: Chronic fatigue is a condition where you feel tired all the time without a clear cause. We will start you on methylphenidate  10 mg twice a day as needed, with the option to increase to 20 mg twice a day if necessary. We will follow up in one month to see how you are responding to the treatment.  -IMPAIRED BALANCE WITH RECENT FALL: Your recent fall and balance issues may be related to your chronic neck and shoulder pain or possible nerve damage. No head injury was reported during the fall.  -SUSPECTED SLEEP DISORDER: A sleep disorder may be contributing to your chronic fatigue. We will proceed with a sleep study to evaluate for potential sleep disorders.  INSTRUCTIONS: Please schedule an appointment with an orthopedic specialist for your neck and shoulder pain. Start taking methylphenidate  10 mg twice a day as needed, and you can increase to 20 mg twice a day if necessary. Schedule a follow-up appointment with us  in one month to assess your response to the medication. Additionally, arrange for a sleep study to evaluate for potential sleep  disorders.                      Contains text generated by Abridge.                                 Contains text generated by Abridge.

## 2024-07-25 ENCOUNTER — Other Ambulatory Visit (HOSPITAL_COMMUNITY): Payer: Self-pay

## 2024-07-25 ENCOUNTER — Telehealth: Payer: Self-pay

## 2024-07-25 ENCOUNTER — Encounter: Payer: Self-pay | Admitting: Family Medicine

## 2024-07-25 DIAGNOSIS — B20 Human immunodeficiency virus [HIV] disease: Secondary | ICD-10-CM

## 2024-07-25 NOTE — Telephone Encounter (Signed)
 Pharmacy Patient Advocate Encounter   Received notification from Onbase that prior authorization for Methylphenidate  HCl 10MG  tablets  is required/requested.   Insurance verification completed.   The patient is insured through [facility] .   Per test claim: PA required; PA submitted to above mentioned insurance via CoverMyMeds Key/confirmation #/[facility] Status is pending

## 2024-07-26 ENCOUNTER — Ambulatory Visit: Payer: Self-pay | Admitting: Family Medicine

## 2024-07-26 DIAGNOSIS — Z125 Encounter for screening for malignant neoplasm of prostate: Secondary | ICD-10-CM

## 2024-07-26 DIAGNOSIS — R800 Isolated proteinuria: Secondary | ICD-10-CM | POA: Insufficient documentation

## 2024-07-26 DIAGNOSIS — R3121 Asymptomatic microscopic hematuria: Secondary | ICD-10-CM | POA: Insufficient documentation

## 2024-07-26 NOTE — Telephone Encounter (Signed)
 Pharmacy Patient Advocate Encounter  Received notification from [facility] that Prior Authorization for Methylphenidate  HCl 10MG  tablets  has been DENIED.  See denial reason below. No denial letter attached in CMM. Will attach denial letter to Media tab once received.   PA #/Case ID/Reference #: [id number]

## 2024-07-26 NOTE — Progress Notes (Signed)
 Assessment & Plan   Assessment/Plan:    Assessment & Plan Chronic neck and bilateral shoulder pain with cervical radiculopathy Chronic neck and bilateral shoulder pain with cervical radiculopathy, affecting both sides, with recent tingling and sharp pain in the right hand. Previous treatments with gabapentin , ibuprofen, and Celebrex  were ineffective. Tizanidine  aids sleep. MRI in 2022 showed disc issues. Past cortisone injections in the shoulder were effective. - Follow up with orthopedics for further evaluation and management. - Continue tizanidine  for sleep.  Chronic fatigue Chronic fatigue with no clear etiology. Previous lab work, including thyroid  function tests, were normal. No sleep study completed yet. Discussed potential use of methylphenidate  to manage symptoms. - Prescribe methylphenidate  10 mg twice a day as needed, with the option to increase to 20 mg twice a day if necessary. - Schedule follow-up in one month to assess response to treatment.  Impaired balance with recent fall Impaired balance with a recent fall, possibly related to chronic neck and shoulder pain or nerve damage. No head injury reported during the fall.  Suspected sleep disorder Suspected sleep disorder contributing to chronic fatigue. No sleep study has been completed yet. - Proceed with sleep study to evaluate for potential sleep disorders.      Medications Discontinued During This Encounter  Medication Reason   celecoxib  (CELEBREX ) 200 MG capsule    gabapentin  (NEURONTIN ) 600 MG tablet     Return in about 1 month (around 08/22/2024) for fatigue.        Subjective:   Encounter date: 07/22/2024  Drew Jenkins is a 58 y.o. male who has Human immunodeficiency virus I infection (HCC); Hypothyroidism, adult; RLS (restless legs syndrome); H/O syphilis; Overweight (BMI 25.0-29.9); Abnormal anal Papanicolaou smear; Lumbago; Depression; GERD (gastroesophageal reflux disease); Screening examination  for venereal disease; Encounter for long-term (current) use of high-risk medication; Medication monitoring encounter; Fatigue; Vertigo; Shortness of breath; Moderate episode of recurrent major depressive disorder (HCC); Pure hypercholesterolemia; Hemorrhoids; HIV disease (HCC); Medication management; Health care maintenance; Immunization counseling; Chronic pain of both shoulders; and Chronic neck pain on their problem list..   He  has a past medical history of HIV infection (HCC).[name]   He presents with chief complaint of Annual Exam (Not fasting, pain in neck and pain both sides ) .   Discussed the use of AI scribe software for clinical note transcription with the patient, who gave verbal consent to proceed.  History of Present Illness Drew Jenkins is a 58 year old male who presents with worsening chronic shoulder pain and new tingling in the right hand.  He has persistent shoulder pain that initially affected the neck and left shoulder, now involving the right side as well. The pain is described as nagging and sometimes radiates down the arms, more frequently on the left side. It disrupts his sleep, and he has experienced a fall due to balance issues, though he did not sustain any head injury. He does not believe the fall worsened his shoulder pain.  He reports a new symptom of tingling in his right hand, which began earlier this week or last weekend. The tingling is described as a sharp pain occurring around the hand, particularly underneath and up to the thumb. This sensation was brief and did not last long.  He has been using several medications for pain management, including Celebrex , gabapentin , and tizanidine . Gabapentin  and ibuprofen at 200 mg twice a day have not provided relief, but tizanidine  has been helpful in aiding sleep at night.  In 2022,  an MRI of the neck showed significant disc issues. He previously received a cortisone injection in his right shoulder in December 2018, which  provided immediate relief. He has not undergone physical therapy or additional injections since then.  He experiences chronic fatigue and reports feeling tired all the time. He has not completed a sleep study.     ROS  History reviewed. No pertinent surgical history.  Outpatient Medications Prior to Visit  Medication Sig Dispense Refill   albuterol  (VENTOLIN  HFA) 108 (90 Base) MCG/ACT inhaler SMARTSIG:2 Puff(s) By Mouth Every 4 Hours PRN     atorvastatin  (LIPITOR) 10 MG tablet Take 1 tablet (10 mg total) by mouth daily. 30 tablet 5   bictegravir-emtricitabine-tenofovir AF (BIKTARVY ) 50-200-25 MG TABS tablet TAKE 1 TABLET BY MOUTH 1 TIME A DAY. 90 tablet 3   Cyanocobalamin (VITAMIN B-12) 5000 MCG LOZG Take 1,000 tablets by mouth daily.     FLUoxetine  (PROZAC ) 40 MG capsule Take 1 capsule (40 mg total) by mouth daily. 90 capsule 2   levothyroxine  (SYNTHROID ) 175 MCG tablet TAKE 1 TABLET BY MOUTH EVERY DAY 90 tablet 3   meclizine  (ANTIVERT ) 25 MG tablet Take 1 tablet (25 mg total) by mouth 3 (three) times daily as needed for dizziness. 30 tablet 1   Multiple Vitamin (MULTIVITAMIN) tablet Take 1 tablet by mouth daily.     pantoprazole  (PROTONIX ) 20 MG tablet Take 1 tablet (20 mg total) by mouth daily. 90 tablet 3   tiZANidine  (ZANAFLEX ) 4 MG tablet Take 1 tablet (4 mg total) by mouth every 6 (six) hours as needed for muscle spasms (or pain). 360 tablet 3   celecoxib  (CELEBREX ) 200 MG capsule Take 1 capsule (200 mg total) by mouth 2 (two) times daily as needed (pain). 180 capsule 3   benzonatate  (TESSALON ) 100 MG capsule Take 1 capsule (100 mg total) by mouth 3 (three) times daily as needed. (Patient not taking: Reported on 07/22/2024) 20 capsule 0   cetirizine  (ZYRTEC  ALLERGY) 10 MG tablet Take 1 tablet (10 mg total) by mouth daily. 30 tablet 0   gabapentin  (NEURONTIN ) 600 MG tablet TAKE 1 TABLET BY MOUTH TWICE A DAY (Patient not taking: Reported on 07/22/2024) 180 tablet 1   No  facility-administered medications prior to visit.    Family History  Problem Relation Age of Onset   Diabetes Mother    Arthritis/Rheumatoid Mother    Hypertension Father     Social History   Socioeconomic History   Marital status: Divorced    Spouse name: Not on file   Number of children: Not on file   Years of education: Not on file   Highest education level: 12th grade  Occupational History   Not on file  Tobacco Use   Smoking status: Never    Passive exposure: Past   Smokeless tobacco: Never  Vaping Use   Vaping status: Never Used  Substance and Sexual Activity   Alcohol use: No   Drug use: No   Sexual activity: Not Currently    Comment: pt declined condoms  Other Topics Concern   Not on file  Social History Narrative   Not on file   Social Drivers of Health   Financial Resource Strain: Low Risk  (06/28/2024)   Overall Financial Resource Strain (CARDIA)    Difficulty of Paying Living Expenses: Not hard at all  Food Insecurity: No Food Insecurity (06/28/2024)   Hunger Vital Sign    Worried About Running Out of Food in the Last Year:  Never true    Ran Out of Food in the Last Year: Never true  Transportation Needs: No Transportation Needs (06/28/2024)   PRAPARE - Administrator, Civil Service (Medical): No    Lack of Transportation (Non-Medical): No  Physical Activity: Inactive (06/28/2024)   Exercise Vital Sign    Days of Exercise per Week: 0 days    Minutes of Exercise per Session: Not on file  Stress: Stress Concern Present (06/28/2024)   [employer] of Occupational Health - Occupational Stress Questionnaire    Feeling of Stress: Very much  Social Connections: Moderately Isolated (06/28/2024)   Social Connection and Isolation Panel    Frequency of Communication with Friends and Family: More than three times a week    Frequency of Social Gatherings with Friends and Family: Patient declined    Attends Religious Services: 1 to 4 times per year     Active Member of [employer] or Organizations: No    Attends Engineer, structural: Not on file    Marital Status: Divorced  Catering manager Violence: Not on file                                                                                                  Objective:  Physical Exam: BP 122/68 (BP Location: Right Arm, Patient Position: Sitting, Cuff Size: Normal)   Pulse 65   Temp 98 F (36.7 C) (Temporal)   Ht 5' 4 (1.626 m)   Wt 173 lb (78.5 kg)   SpO2 97%   BMI 29.70 kg/m     Physical Exam GENERAL: Alert, cooperative, well developed, no acute distress HEENT: Normocephalic, normal oropharynx, moist mucous membranes CHEST: Clear to auscultation bilaterally, no wheezes, rhonchi, or crackles CARDIOVASCULAR: Normal heart rate and rhythm, S1 and S2 normal without murmurs ABDOMEN: Soft, non-tender, non-distended, without organomegaly, normal bowel sounds EXTREMITIES: No cyanosis or edema NEUROLOGICAL: Cranial nerves grossly intact, moves all extremities without gross motor or sensory deficit     No results found.  Recent Results (from the past 2160 hours)  CBC     Status: None   Collection Time: 05/19/24  3:37 PM  Result Value Ref Range   WBC 7.0 3.8 - 10.8 Thousand/uL   RBC 4.70 4.20 - 5.80 Million/uL   Hemoglobin 14.5 13.2 - 17.1 g/dL   HCT 57.6 61.4 - 49.9 %   MCV 90.0 80.0 - 100.0 fL   MCH 30.9 27.0 - 33.0 pg   MCHC 34.3 32.0 - 36.0 g/dL    Comment: For adults, a slight decrease in the calculated MCHC value (in the range of 30 to 32 g/dL) is most likely not clinically significant; however, it should be interpreted with caution in correlation with other red cell parameters and the patient's clinical condition.    RDW 12.4 11.0 - 15.0 %   Platelets 302 140 - 400 Thousand/uL   MPV 9.4 7.5 - 12.5 fL  Lipid panel     Status: Abnormal   Collection Time: 05/19/24  3:37 PM  Result Value Ref Range   Cholesterol 152 <200 mg/dL  HDL 36 (L) > OR = 40 mg/dL    Triglycerides 720 (H) <150 mg/dL    Comment: . If a non-fasting specimen was collected, consider repeat triglyceride testing on a fasting specimen if clinically indicated.  Veatrice et al. J. of Clin. Lipidol. 2015;9:129-169. [name]    LDL Cholesterol (Calc) 79 mg/dL (calc)    Comment: Reference range: <100 . Desirable range <100 mg/dL for primary prevention;   <70 mg/dL for patients with CHD or diabetic patients  with > or = 2 CHD risk factors. [name] LDL-C is now calculated using the Martin-Hopkins  calculation, which is a validated novel method providing  better accuracy than the Friedewald equation in the  estimation of LDL-C.  Gladis APPLETHWAITE et al. SANDREA. 7986;689(80): 2061-2068  (http://education.[facility].com/faq/FAQ164)    Total CHOL/HDL Ratio 4.2 <5.0 (calc)   Non-HDL Cholesterol (Calc) 116 <130 mg/dL (calc)    Comment: For patients with diabetes plus 1 major ASCVD risk  factor, treating to a non-HDL-C goal of <100 mg/dL  (LDL-C of <29 mg/dL) is considered a therapeutic  option.   RPR     Status: Abnormal   Collection Time: 05/19/24  3:37 PM  Result Value Ref Range   RPR Ser Ql REACTIVE (A) NON-REACTIVE  T-helper cells (CD4) count (not at [facility])     Status: None   Collection Time: 05/19/24  3:37 PM  Result Value Ref Range   CD4 T Helper % 31 30 - 61 %   Absolute CD4 1,132 490 - 1,740 cells/uL   Total lymphocyte count 3,633 850 - 3,900 cells/uL    Comment: The analytical performance characteristics of this assay  have been determined by [employer]. The  modifications have not been cleared or approved by the  FDA. This assay has been validated pursuant to the CLIA  regulations and is used for clinical purposes.   HIV RNA, RTPCR W/R GT (RTI, PI,INT)     Status: None   Collection Time: 05/19/24  3:37 PM  Result Value Ref Range   HIV 1 RNA Quant NOT DETECTED copies/mL   HIV-1 RNA Quant, Log NOT DETECTED Log copies/mL    Comment: REFERENCE RANGE:           NOT  DETECTED  copies/mL           NOT DETECTED  Log copies/mL . This test was performed using Real-Time Polymerase Chain Reaction. . Reportable range is 20 to 10,000,000 copies/mL (1.30-7.00 Log copies/mL).   Comprehensive metabolic panel with GFR     Status: None   Collection Time: 05/19/24  3:37 PM  Result Value Ref Range   Glucose, Bld 87 65 - 99 mg/dL    Comment: .            Fasting reference interval .    BUN 11 7 - 25 mg/dL   Creat 9.11 9.29 - 8.69 mg/dL   eGFR 99 > OR = 60 fO/fpw/8.26f7   BUN/Creatinine Ratio SEE NOTE: 6 - 22 (calc)    Comment:    Not Reported: BUN and Creatinine are within    reference range. .    Sodium 137 135 - 146 mmol/L   Potassium 3.7 3.5 - 5.3 mmol/L   Chloride 103 98 - 110 mmol/L   CO2 26 20 - 32 mmol/L   Calcium  9.4 8.6 - 10.3 mg/dL   Total Protein 6.8 6.1 - 8.1 g/dL   Albumin 4.2 3.6 - 5.1 g/dL   Globulin 2.6 1.9 - 3.7 g/dL (  calc)   AG Ratio 1.6 1.0 - 2.5 (calc)   Total Bilirubin 1.1 0.2 - 1.2 mg/dL   Alkaline phosphatase (APISO) 61 35 - 144 U/L   AST 24 10 - 35 U/L   ALT 25 9 - 46 U/L  Rpr titer     Status: Abnormal   Collection Time: 05/19/24  3:37 PM  Result Value Ref Range   RPR Titer 1:1 (H)   T PALLIDUM AB     Status: Abnormal   Collection Time: 05/19/24  3:37 PM  Result Value Ref Range   T Pallidum Abs POSITIVE (A) NEGATIVE    Comment: . Nontreponemal and treponemal antibodies detected. Consistent with past or current syphilis. Clinical evaluation should be performed to identify signs, symptoms, or history of past infection or treatment.   C. trachomatis/N. gonorrhoeae RNA     Status: None   Collection Time: 05/19/24  3:46 PM   Specimen: Urine  Result Value Ref Range   C. trachomatis RNA, TMA NOT DETECTED NOT DETECTED   N. gonorrhoeae RNA, TMA NOT DETECTED NOT DETECTED    Comment: The analytical performance characteristics of this assay, when used to test SurePath(TM) specimens have been determined by [employer]. The modifications have not been cleared or approved by the FDA. This assay has been validated pursuant to the CLIA regulations and is used for clinical purposes. . For additional information, please refer to https://education.[facility].com/faq/FAQ154 (This link is being provided for information/ educational purposes only.) .   COLOGUARD     Status: Normal   Collection Time: 06/12/24 12:45 PM  Result Value Ref Range   COLOGUARD Negative Negative    Comment:  The Cologuard (TM) test was performed on this specimen.  NEGATIVE TEST RESULT. A negative Cologuard result indicates a low likelihood that a colorectal cancer (CRC) or advanced adenoma (adenomatous polyps with more advanced pre-malignant features) is present. The chance that a person with a negative Cologuard test has a colorectal cancer is less than 1 in 1500 (negative predictive value >99.9%) or has an advanced adenoma is less than 5.3% (negative predictive value 94.7%). These data are based on a prospective cross-sectional study of 10,000 individuals at average risk for colorectal cancer who were screened with both Cologuard and colonoscopy. (Imperiale T. et al, N Engl J Med 2014;370(14):1286-1297) The normal value (reference range) for this assay is negative.  COLOGUARD RE-SCREENING RECOMMENDATION: Periodic colorectal cancer screening is an important part of preventive healthcare for asymptomatic individuals at average risk for colorectal cancer. Following a negative Cologuard result, the American Cancer Society and U.S. Multi-Society Task Force screening guidelines recommend a Cologuard re-screening interval of 3 years.  References: American Cancer Society Guideline for Colorectal Cancer Screening: https://www.cancer.org/cancer/colon-rectal-cancer/detection-diagnosis-staging/acs-recommendations.html.; Rex DK, Boland CR, Dominitz JK, Colorectal Cancer Screening: Recommendations for Physicians and Patients from the U.S.  Multi-Society Task Force on Colorectal Cancer Screening , Am J Gastroenterology 2017; 112:1016-1030.  TEST DESCRIPTION: Composite algorithmic analysis of stool DNA-biomarkers with hemoglobin immunoassay.   Quantitative values of individual biomarkers are not reportable and are not associated with individual biomarker result reference ranges. Cologuard is intended for colorectal cancer screening of adults of either sex, 45 years or older, who are at average-risk for colorectal cancer (CRC). Cologuard has been approved for use by the U.S. FDA. The performance of Cologuard was established in a cross sectional study of average-risk adults aged 86-84. Cologuard performance in patients ages 52 to 23 years was estimated by sub-group analysis of near-age groups. Colonoscopies performed for a  positive result may find as the most clinically significant lesion: colorectal cancer [4.0%], advanced adenoma (including sessile serrated polyps greater than or equal to 1cm diameter) [20%] or non- advanced adenoma [31%]; or no colorectal neoplasia [45%]. These estimates are derived from a prospective  cross-sectional screening study of 10,000 individuals at average risk for colorectal cancer who were screened with both Cologuard and colonoscopy. (Imperiale T. et al, LOISE [place] J Med 2014;370(14):1286-1297.) Cologuard may produce a false negative or false positive result (no colorectal cancer or precancerous polyp present at colonoscopy follow up). A negative Cologuard test result does not guarantee the absence of CRC or advanced adenoma (pre-cancer). The current Cologuard screening interval is every 3 years. Science writer and U.[place] Therapist, music). Cologuard performance data in a 10,000 patient pivotal study using colonoscopy as the reference method can be accessed at the following location: www.exactlabs.com/results. Additional description of the Cologuard test process, warnings and precautions can be found at  www.cologuard.com.  TSH Rfx on Abnormal to Free T4     Status: None   Collection Time: 07/01/24  3:08 PM  Result Value Ref Range   TSH 2.030 0.450 - 4.500 uIU/mL  POCT Urinalysis Dipstick (Automated)     Status: Abnormal   Collection Time: 07/22/24  4:35 PM  Result Value Ref Range   Color, UA     Clarity, UA     Glucose, UA Negative Negative   Bilirubin, UA negative    Ketones, UA negative    Spec Grav, UA 1.020 1.010 - 1.025   Blood, UA postive    pH, UA 6.0 5.0 - 8.0   Protein, UA Positive (R) Negative   Urobilinogen, UA 0.2 0.2 or 1.0 E.U./dL   Nitrite, UA negative    Leukocytes, UA Negative Negative        Beverley Adine Hummer, MD, MS

## 2024-07-27 ENCOUNTER — Ambulatory Visit: Admitting: Physical Medicine and Rehabilitation

## 2024-07-29 MED ORDER — MODAFINIL 200 MG PO TABS: ORAL_TABLET | ORAL | 0 refills | Status: DC

## 2024-07-29 NOTE — Addendum Note (Signed)
 Addended by: SEBASTIAN BEVERLEY NOVAK on: 07/29/2024 03:47 PM   Modules accepted: Orders

## 2024-08-01 NOTE — Telephone Encounter (Signed)
 Noted

## 2024-08-02 ENCOUNTER — Other Ambulatory Visit (HOSPITAL_COMMUNITY): Payer: Self-pay

## 2024-08-02 ENCOUNTER — Telehealth: Payer: Self-pay

## 2024-08-02 DIAGNOSIS — B20 Human immunodeficiency virus [HIV] disease: Secondary | ICD-10-CM

## 2024-08-02 NOTE — Telephone Encounter (Signed)
 Pharmacy Patient Advocate Encounter   Received notification from Onbase that prior authorization for Modafinil  200 tabs is required/requested.   Insurance verification completed.   The patient is insured through [facility] .   Per test claim: PA required; PA submitted to above mentioned insurance via Latent Key/confirmation #/EOC [id number] Status is pending

## 2024-08-03 ENCOUNTER — Telehealth: Payer: Self-pay

## 2024-08-03 ENCOUNTER — Other Ambulatory Visit (HOSPITAL_COMMUNITY): Payer: Self-pay

## 2024-08-03 MED ORDER — AMPHETAMINE-DEXTROAMPHETAMINE 5 MG PO TABS: 5.0000 mg | ORAL_TABLET | Freq: Two times a day (BID) | ORAL | 0 refills | Status: DC

## 2024-08-03 NOTE — Telephone Encounter (Signed)
 LVM for Pt to call the office. In regards to [place] message.  Angie

## 2024-08-03 NOTE — Telephone Encounter (Signed)
 Pharmacy Patient Advocate Encounter   Received notification from CoverMyMeds that prior authorization for Amphetamine -Dextroamphetamine  5MG  tablets  is required/requested.   Insurance verification completed.   The patient is insured through [facility] .   Per test claim: PA required; PA submitted to above mentioned insurance via Latent Key/confirmation #/EOC [id number] Status is pending

## 2024-08-03 NOTE — Addendum Note (Signed)
 Addended by: SEBASTIAN BEVERLEY NOVAK on: 08/03/2024 01:13 PM   Modules accepted: Orders

## 2024-08-03 NOTE — Telephone Encounter (Signed)
 Pharmacy Patient Advocate Encounter  Received notification from [facility] that Prior Authorization for Modafinil  200 tabs has been DENIED.  Full denial letter will be uploaded to the media tab. See denial reason below.     PA #/Case ID/Reference #: [id number]

## 2024-08-04 ENCOUNTER — Other Ambulatory Visit (HOSPITAL_COMMUNITY): Payer: Self-pay

## 2024-08-04 NOTE — Telephone Encounter (Signed)
 Pharmacy Patient Advocate Encounter  Received notification from [facility] that Prior Authorization for Amphetamine -Dextroamphetamine  5MG  tablets  has been DENIED.  See denial reason below. No denial letter attached in CMM. Will attach denial letter to Media tab once received.   PA #/Case ID/Reference #: [id number]

## 2024-08-11 ENCOUNTER — Ambulatory Visit: Admitting: Physical Medicine and Rehabilitation

## 2024-08-11 ENCOUNTER — Encounter: Payer: Self-pay | Admitting: Physical Medicine and Rehabilitation

## 2024-08-11 DIAGNOSIS — M5412 Radiculopathy, cervical region: Secondary | ICD-10-CM | POA: Diagnosis not present

## 2024-08-11 NOTE — Progress Notes (Signed)
 Pain Scale   Average Pain 8 Patient advising he has neck pain that radiates to both shoulders        +Driver, -BT, -Dye Allergies.

## 2024-08-11 NOTE — Progress Notes (Signed)
 Izekiel Flegel II - 58 y.o. male MRN [id number]  Date of birth: 06-03-1966  Office Visit Note: Visit Date: 08/11/2024 PCP: Sebastian Beverley NOVAK, MD Referred by: Sebastian Beverley NOVAK, MD  Subjective: Chief Complaint  Patient presents with   Neck - Pain   HPI: Jontay Maston II is a 58 y.o. male who comes in today per the request of Dr. Beverley Sebastian for evaluation of chronic, worsening and severe bilateral neck pain radiating to shoulders and arms, left greater than right. Pain ongoing for several years. His pain is constant, no specific aggravating factors. Sleeping is difficult due to severe pain. He describes his pain as sore and grinding sensation, currently rates as 7 out of 10. Some relief of pain with home exercise regimen, rest and use of medications. Some relief of pain with Ibuprofen.  He has also tried Celebrex  and gabapentin  with minimal relief of pain.  No history of formal physical therapy. Cervical MRI imaging from 2022 shows multi level mild central canal stenosis and foraminal stenosis. No high grade central canal stenosis. He underwent right C7-T1 interlaminar epidural steroid injection in our office on 02/03/2022, reports significant relief of pain for several years with this procedure. Patient denies focal weakness, numbness and tingling. No recent trauma or falls.   Patients course is complicated by depression and HIV.      Review of Systems  Musculoskeletal:  Positive for neck pain.  Neurological:  Negative for tingling, sensory change, focal weakness and weakness.  All other systems reviewed and are negative.  Otherwise per HPI.  Assessment & Plan: Visit Diagnoses:    ICD-10-CM   1. Radiculopathy, cervical region  M54.12        Plan: Findings:  Chronic, worsening and severe bilateral neck pain radiating to shoulders and arms, left greater than right. Patient continues to have severe pain despite good conservative therapies such as home exercise regimen, rest and use of  medications. Patients clinical presentation and exam are consistent with cervical radiculopathy. He does have history of chronic bilateral shoulder pain. Pain with abduction of left shoulder today. We discussed treatment plan in detail today. Next step is to perform diagnostic and hopefully therapeutic left C7-T1 interlaminar epidural steroid injection under fluoroscopic guidance.  He is not currently taking anticoagulant medications.  If good relief of pain with injection we can repeat this procedure infrequently as needed.  Should his left shoulder pain persist would recommend he follow-up with Dr. Jerri for further evaluation.  He has no questions at this time.  No red flag symptoms noted upon exam today.    Meds & Orders: No orders of the defined types were placed in this encounter.  No orders of the defined types were placed in this encounter.   Follow-up: Return for Left C7-T1 interlaminar epidural steroid injection.   Procedures: No procedures performed      Clinical History: CLINICAL DATA:  Acute on chronic neck pain.   EXAM: CERVICAL SPINE - COMPLETE 4+ VIEW   COMPARISON:  None.   FINDINGS: On the lateral view the cervical spine is visualized to the level of c7. There is straightening of the normal cervical lordosis likely due to degenerative changes. Alignment is otherwise normal.   Dens is well positioned between the lateral masses of C1. There is limited evaluation of the dens for acute fracture on the open-mouth view due to overlying osseous structures.   Multilevel degenerative changes of the spine most prominent at the C4 through C6 levels with at  least moderate bilateral osseous neural foraminal stenosis at the C4-C5 level. No acute displaced fracture is detected. Cervical disc heights are preserved.No aggressive-appearing focal osseous lesions.   Pre-vertebral soft tissues are within normal limits.   Bilateral hearing aids noted.   IMPRESSION: 1. No acute  displaced fracture or traumatic listhesis of the cervical spine. 2. Multilevel degenerative changes with at least moderate bilateral osseous neural foraminal stenosis at the C4-C5 level.     Electronically Signed   By: Morgane  Naveau M.D.   On: 06/19/2021 21:23   He reports that he has never smoked. He has been exposed to tobacco smoke. He has never used smokeless tobacco. No results for input(s): HGBA1C, LABURIC in the last 8760 hours.  Objective:  VS:  HT:    WT:   BMI:     BP:   HR: bpm  TEMP: ( )  RESP:  Physical Exam Vitals and nursing note reviewed.  HENT:     Head: Normocephalic and atraumatic.     Right Ear: External ear normal.     Left Ear: External ear normal.     Nose: Nose normal.     Mouth/Throat:     Mouth: Mucous membranes are moist.  Eyes:     Extraocular Movements: Extraocular movements intact.  Cardiovascular:     Rate and Rhythm: Normal rate.     Pulses: Normal pulses.  Pulmonary:     Effort: Pulmonary effort is normal.  Abdominal:     General: Abdomen is flat. There is no distension.  Musculoskeletal:        General: Tenderness present.     Cervical back: Tenderness present.     Comments: Discomfort noted with flexion. Patient has good strength in the upper extremities including 5 out of 5 strength in wrist extension, long finger flexion and APB. Limited range of motion to left shoulder, pain with abduction. Full ROM to right shoulder. There is no atrophy of the hands intrinsically. Sensation intact bilaterally. Negative Hoffman's sign. Negative Spurling's sign.     Skin:    General: Skin is warm and dry.     Capillary Refill: Capillary refill takes less than 2 seconds.  Neurological:     General: No focal deficit present.     Mental Status: He is alert and oriented to person, place, and time.  Psychiatric:        Mood and Affect: Mood normal.        Behavior: Behavior normal.     Ortho Exam  Imaging: No results found.  Past  Medical/Family/Surgical/Social History: Medications & Allergies reviewed per EMR, new medications updated. 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   Medication monitoring encounter 03/11/2018   Screening examination for venereal disease 04/09/2017   Encounter for long-term (current) use of high-risk medication 04/09/2017   Depression 11/20/2016   GERD (gastroesophageal reflux disease) 11/20/2016   Human immunodeficiency virus I infection (HCC) 08/21/2016   Hypothyroidism, adult 08/21/2016  RLS (restless legs syndrome) 08/21/2016   H/O syphilis 08/21/2016   Overweight (BMI 25.0-29.9) 08/21/2016   Abnormal anal Papanicolaou smear 08/21/2016   Lumbago 08/21/2016   Past Medical History:  Diagnosis Date   HIV infection (HCC)    Family History  Problem Relation Age of Onset   Diabetes Mother    Arthritis/Rheumatoid Mother    Hypertension Father    No past surgical history on file. Social History   Occupational History   Not on file  Tobacco Use   Smoking status: Never    Passive exposure: Past   Smokeless tobacco: Never  Vaping Use   Vaping status: Never Used  Substance and Sexual Activity   Alcohol use: No   Drug use: No   Sexual activity: Not Currently    Comment: pt declined condoms

## 2024-08-23 ENCOUNTER — Encounter: Payer: Self-pay | Admitting: Family Medicine

## 2024-08-23 ENCOUNTER — Ambulatory Visit: Admitting: Family Medicine

## 2024-08-23 VITALS — BP 122/68 | HR 63 | Temp 97.6°F | Resp 18 | Wt 173.8 lb

## 2024-08-23 DIAGNOSIS — R800 Isolated proteinuria: Secondary | ICD-10-CM | POA: Diagnosis not present

## 2024-08-23 DIAGNOSIS — G9332 Myalgic encephalomyelitis/chronic fatigue syndrome: Secondary | ICD-10-CM | POA: Diagnosis not present

## 2024-08-23 DIAGNOSIS — R3121 Asymptomatic microscopic hematuria: Secondary | ICD-10-CM

## 2024-08-23 DIAGNOSIS — M4722 Other spondylosis with radiculopathy, cervical region: Secondary | ICD-10-CM

## 2024-08-23 DIAGNOSIS — Z23 Encounter for immunization: Secondary | ICD-10-CM | POA: Diagnosis not present

## 2024-08-23 DIAGNOSIS — M4712 Other spondylosis with myelopathy, cervical region: Secondary | ICD-10-CM | POA: Diagnosis not present

## 2024-08-23 DIAGNOSIS — Z125 Encounter for screening for malignant neoplasm of prostate: Secondary | ICD-10-CM | POA: Diagnosis not present

## 2024-08-23 MED ORDER — NALTREXONE HCL (PAIN) 4.5 MG PO CAPS: 1.0000 | ORAL_CAPSULE | Freq: Every day | ORAL | 1 refills | Status: DC

## 2024-08-23 NOTE — Addendum Note (Signed)
 Addended by: ALTO PARODY D on: 08/23/2024 04:00 PM   Modules accepted: Orders

## 2024-08-23 NOTE — Progress Notes (Signed)
 Assessment & Plan   Assessment/Plan:   Assessment & Plan Chronic fatigue syndrome (myalgic encephalomyelitis) Chronic fatigue syndrome with improvement in fatigue after reducing vitamin B12 from 5000 mcg to 1000 mcg daily. Insurance did not cover modafinil , methylphenidate , or amphetamine /dextroamphetamine  for chronic fatigue. Considering low dose naltrexone  due to emerging evidence supporting its use for chronic fatigue and pain management. Low dose naltrexone  is not covered by insurance and requires compounding, but is relatively affordable at $20-$30 per month. Minimal side effects expected at low doses. - Prescribe low dose naltrexone  4.5 mg daily from [facility] in [place] - Follow up in one month to assess improvement in symptoms  Cervical radiculopathy Chronic cervical radiculopathy with associated shoulder and neck pain. Insurance coverage for further injections is uncertain. Exploring low dose naltrexone  as a potential option for pain management due to its emerging evidence in treating chronic pain. He has been informed about the cost and availability of the medication, and the potential benefits in managing chronic pain and fatigue. - Prescribe low dose naltrexone  4.5 mg daily from [facility] in [place] - Follow up in one month to assess improvement in symptoms      Medications Discontinued During This Encounter  Medication Reason   amphetamine -dextroamphetamine  (ADDERALL) 5 MG tablet     Return in about 1 month (around 09/22/2024) for neck pain and fatigue.        Subjective:   Encounter date: 08/23/2024  Drew Jenkins is a 58 y.o. male who has Human immunodeficiency virus I infection (HCC); Hypothyroidism, adult; RLS (restless legs syndrome); H/O syphilis; Overweight (BMI 25.0-29.9); Abnormal anal Papanicolaou smear; Lumbago; Depression; GERD (gastroesophageal reflux disease); Screening examination for venereal disease; Encounter for long-term  (current) use of high-risk medication; Medication monitoring encounter; Fatigue associated with AIDS (HCC); Vertigo; Shortness of breath; Moderate episode of recurrent major depressive disorder (HCC); Pure hypercholesterolemia; Hemorrhoids; HIV disease (HCC); Medication management; Health care maintenance; Immunization counseling; Chronic pain of both shoulders; Chronic neck pain; Asymptomatic microscopic hematuria; 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   He  has a past medical history of Allergy (Na), Anxiety (NA), Depression (NA), GERD (gastroesophageal reflux disease) (NA), Heart murmur (1985), HIV infection (HCC), Neuromuscular disorder (HCC) (NA), and Thyroid  disease (1965).[name]   He presents with chief complaint of Fatigue (1 month follow up. Pt stated symptoms have improved //HM due - vaccinations ) .   Discussed the use of AI scribe software for clinical note transcription with the patient, who gave verbal consent to proceed.  History of Present Illness Drew Jenkins is a 58 year old male who presents for management of chronic fatigue and cervical radiculopathy.  Chronic fatigue - Chronic fatigue present, improved over the past month after reducing vitamin B12 intake from 5000 micrograms to 1000 micrograms daily - Fatigue less severe, allowing increased engagement in activities compared to previous baseline - Previously trialed stimulants for fatigue, including modafinil , methylphenidate , and amphetamine /dextromethamphetamine tablets, but these were not covered by insurance  Cervical radiculopathy and associated pain - Chronic pain primarily localized to the shoulder, with occasional radiation to the neck - Pain associated with cervical radiculopathy - Previously evaluated by an orthopedic specialist     ROS  History  reviewed. No pertinent surgical history.  Outpatient Medications Prior to Visit  Medication Sig Dispense Refill   albuterol  (VENTOLIN  HFA) 108 (90 Base) MCG/ACT inhaler SMARTSIG:2 Puff(s) By Mouth Every 4 Hours PRN  atorvastatin  (LIPITOR) 10 MG tablet Take 1 tablet (10 mg total) by mouth daily. 30 tablet 5   bictegravir-emtricitabine-tenofovir AF (BIKTARVY ) 50-200-25 MG TABS tablet TAKE 1 TABLET BY MOUTH 1 TIME A DAY. 90 tablet 3   Cyanocobalamin (VITAMIN B-12) 5000 MCG LOZG Take 1,000 tablets by mouth daily.     FLUoxetine  (PROZAC ) 40 MG capsule Take 1 capsule (40 mg total) by mouth daily. 90 capsule 2   levothyroxine  (SYNTHROID ) 175 MCG tablet TAKE 1 TABLET BY MOUTH EVERY DAY 90 tablet 3   meclizine  (ANTIVERT ) 25 MG tablet Take 1 tablet (25 mg total) by mouth 3 (three) times daily as needed for dizziness. 30 tablet 1   Multiple Vitamin (MULTIVITAMIN) tablet Take 1 tablet by mouth daily.     pantoprazole  (PROTONIX ) 20 MG tablet Take 1 tablet (20 mg total) by mouth daily. 90 tablet 3   tiZANidine  (ZANAFLEX ) 4 MG tablet Take 1 tablet (4 mg total) by mouth every 6 (six) hours as needed for muscle spasms (or pain). 360 tablet 3   benzonatate  (TESSALON ) 100 MG capsule Take 1 capsule (100 mg total) by mouth 3 (three) times daily as needed. (Patient not taking: Reported on 08/23/2024) 20 capsule 0   cetirizine  (ZYRTEC  ALLERGY) 10 MG tablet Take 1 tablet (10 mg total) by mouth daily. 30 tablet 0   amphetamine -dextroamphetamine  (ADDERALL) 5 MG tablet Take 1 tablet (5 mg total) by mouth 2 (two) times daily with breakfast and lunch. (Patient not taking: Reported on 08/23/2024) 60 tablet 0   No facility-administered medications prior to visit.    Family History  Problem Relation Age of Onset   Diabetes Mother    Arthritis/Rheumatoid Mother    Hypertension Father     Social History   Socioeconomic History   Marital status: Divorced    Spouse name: Not on file   Number of children: Not on file    Years of education: Not on file   Highest education level: 12th grade  Occupational History   Not on file  Tobacco Use   Smoking status: Never    Passive exposure: Past   Smokeless tobacco: Never  Vaping Use   Vaping status: Never Used  Substance and Sexual Activity   Alcohol use: No   Drug use: No   Sexual activity: Not Currently    Comment: pt declined condoms  Other Topics Concern   Not on file  Social History Narrative   Not on file   Social Drivers of Health   Financial Resource Strain: Low Risk  (06/28/2024)   Overall Financial Resource Strain (CARDIA)    Difficulty of Paying Living Expenses: Not hard at all  Food Insecurity: No Food Insecurity (06/28/2024)   Hunger Vital Sign    Worried About Running Out of Food in the Last Year: Never true    Ran Out of Food in the Last Year: Never true  Transportation Needs: No Transportation Needs (06/28/2024)   PRAPARE - Administrator, Civil Service (Medical): No    Lack of Transportation (Non-Medical): No  Physical Activity: Inactive (06/28/2024)   Exercise Vital Sign    Days of Exercise per Week: 0 days    Minutes of Exercise per Session: Not on file  Stress: Stress Concern Present (06/28/2024)   [employer] of Occupational Health - Occupational Stress Questionnaire    Feeling of Stress: Very much  Social Connections: Moderately Isolated (06/28/2024)   Social Connection and Isolation Panel    Frequency of Communication with  Friends and Family: More than three times a week    Frequency of Social Gatherings with Friends and Family: Patient declined    Attends Religious Services: 1 to 4 times per year    Active Member of [employer] or Organizations: No    Attends Engineer, structural: Not on file    Marital Status: Divorced  Catering manager Violence: Not on file                                                                                                  Objective:  Physical Exam: BP 122/68 (BP  Location: Left Arm, Patient Position: Sitting, Cuff Size: Large)   Pulse 63   Temp 97.6 F (36.4 C) (Temporal)   Resp 18   Wt 173 lb 12.8 oz (78.8 kg)   SpO2 100%   BMI 29.83 kg/m    Physical Exam GENERAL: Alert, cooperative, well developed, no acute distress. HEENT: Normocephalic, normal oropharynx, moist mucous membranes. CHEST: Clear to auscultation bilaterally, no wheezes, rhonchi, or crackles. CARDIOVASCULAR: Normal heart rate and rhythm, S1 and S2 normal without murmurs. ABDOMEN: Soft, non-tender, non-distended, without organomegaly, normal bowel sounds. EXTREMITIES: No cyanosis or edema. NEUROLOGICAL: Cranial nerves grossly intact, moves all extremities without gross motor or sensory deficit.   Physical Exam  No results found.  Recent Results (from the past 2160 hours)  COLOGUARD     Status: Normal   Collection Time: 06/12/24 12:45 PM  Result Value Ref Range   COLOGUARD Negative Negative    Comment:  The Cologuard (TM) test was performed on this specimen.  NEGATIVE TEST RESULT. A negative Cologuard result indicates a low likelihood that a colorectal cancer (CRC) or advanced adenoma (adenomatous polyps with more advanced pre-malignant features) is present. The chance that a person with a negative Cologuard test has a colorectal cancer is less than 1 in 1500 (negative predictive value >99.9%) or has an advanced adenoma is less than 5.3% (negative predictive value 94.7%). These data are based on a prospective cross-sectional study of 10,000 individuals at average risk for colorectal cancer who were screened with both Cologuard and colonoscopy. (Imperiale T. et al, N Engl J Med 2014;370(14):1286-1297) The normal value (reference range) for this assay is negative.  COLOGUARD RE-SCREENING RECOMMENDATION: Periodic colorectal cancer screening is an important part of preventive healthcare for asymptomatic individuals at average risk for colorectal cancer. Following a negative  Cologuard result, the American Cancer Society and U.S. Multi-Society Task Force screening guidelines recommend a Cologuard re-screening interval of 3 years.  References: American Cancer Society Guideline for Colorectal Cancer Screening: https://www.cancer.org/cancer/colon-rectal-cancer/detection-diagnosis-staging/acs-recommendations.html.; Rex DK, Boland CR, Dominitz JK, Colorectal Cancer Screening: Recommendations for Physicians and Patients from the U.S. Multi-Society Task Force on Colorectal Cancer Screening , Am J Gastroenterology 2017; 112:1016-1030.  TEST DESCRIPTION: Composite algorithmic analysis of stool DNA-biomarkers with hemoglobin immunoassay.   Quantitative values of individual biomarkers are not reportable and are not associated with individual biomarker result reference ranges. Cologuard is intended for colorectal cancer screening of adults of either sex, 45 years or older, who are at average-risk for colorectal cancer (CRC). Cologuard has  been approved for use by the U.S. FDA. The performance of Cologuard was established in a cross sectional study of average-risk adults aged 57-84. Cologuard performance in patients ages 24 to 51 years was estimated by sub-group analysis of near-age groups. Colonoscopies performed for a positive result may find as the most clinically significant lesion: colorectal cancer [4.0%], advanced adenoma (including sessile serrated polyps greater than or equal to 1cm diameter) [20%] or non- advanced adenoma [31%]; or no colorectal neoplasia [45%]. These estimates are derived from a prospective  cross-sectional screening study of 10,000 individuals at average risk for colorectal cancer who were screened with both Cologuard and colonoscopy. (Imperiale T. et al, LOISE [place] J Med 2014;370(14):1286-1297.) Cologuard may produce a false negative or false positive result (no colorectal cancer or precancerous polyp present at colonoscopy follow up). A negative Cologuard test result  does not guarantee the absence of CRC or advanced adenoma (pre-cancer). The current Cologuard screening interval is every 3 years. Science writer and U.S. Therapist, music). Cologuard performance data in a 10,000 patient pivotal study using colonoscopy as the reference method can be accessed at the following location: www.exactlabs.com/results. Additional description of the Cologuard test process, warnings and precautions can be found at www.cologuard.com.  TSH Rfx on Abnormal to Free T4     Status: None   Collection Time: 07/01/24  3:08 PM  Result Value Ref Range   TSH 2.030 0.450 - 4.500 uIU/mL  POCT Urinalysis Dipstick (Automated)     Status: Abnormal   Collection Time: 07/22/24  4:35 PM  Result Value Ref Range   Color, UA     Clarity, UA     Glucose, UA Negative Negative   Bilirubin, UA negative    Ketones, UA negative    Spec Grav, UA 1.020 1.010 - 1.025   Blood, UA postive    pH, UA 6.0 5.0 - 8.0   Protein, UA Positive (R) Negative   Urobilinogen, UA 0.2 0.2 or 1.0 E.U./dL   Nitrite, UA negative    Leukocytes, UA Negative Negative        Beverley Adine Hummer, MD, MS

## 2024-08-23 NOTE — Addendum Note (Signed)
 Addended by: EUGENIE ULANDA CROME on: 08/23/2024 03:50 PM   Modules accepted: Orders

## 2024-08-23 NOTE — Addendum Note (Signed)
 Addended by: EUGENIE ULANDA CROME on: 08/23/2024 04:12 PM   Modules accepted: Orders

## 2024-08-23 NOTE — Patient Instructions (Signed)
  VISIT SUMMARY: You visited us  today to manage your chronic fatigue and cervical radiculopathy. We discussed your recent improvement in fatigue after adjusting your vitamin B12 intake and explored new treatment options for both your fatigue and pain.  YOUR PLAN: CHRONIC FATIGUE SYNDROME (MYALGIC ENCEPHALOMYELITIS): Your chronic fatigue has improved after reducing your vitamin B12 intake. We are considering a new treatment option to further help manage your fatigue. -Start taking low dose naltrexone  4.5 mg daily from [facility] in [place]. -Follow up in one month to assess improvement in your symptoms.  CERVICAL RADICULOPATHY: You have chronic pain in your shoulder and neck due to cervical radiculopathy. We are exploring new treatment options to help manage your pain. -Start taking low dose naltrexone  4.5 mg daily from [facility] in [place]. -Follow up in one month to assess improvement in your symptoms.

## 2024-08-23 NOTE — Addendum Note (Signed)
 Addended by: EUGENIE ULANDA CROME on: 08/23/2024 03:54 PM   Modules accepted: Orders

## 2024-08-24 ENCOUNTER — Ambulatory Visit: Payer: Self-pay | Admitting: Family Medicine

## 2024-08-24 LAB — URINALYSIS W MICROSCOPIC + REFLEX CULTURE
Bacteria, UA: NONE SEEN /HPF
Bilirubin Urine: NEGATIVE
Glucose, UA: NEGATIVE
Hgb urine dipstick: NEGATIVE
Hyaline Cast: NONE SEEN /LPF
Ketones, ur: NEGATIVE
Leukocyte Esterase: NEGATIVE
Nitrites, Initial: NEGATIVE
Protein, ur: NEGATIVE
RBC / HPF: NONE SEEN /HPF (ref 0–2)
Specific Gravity, Urine: 1.015 (ref 1.001–1.035)
Squamous Epithelial / HPF: NONE SEEN /HPF (ref <=5)
WBC, UA: NONE SEEN /HPF (ref 0–5)
pH: 7 (ref 5.0–8.0)

## 2024-08-24 LAB — PSA: PSA: 0.44 ng/mL (ref 0.10–4.00)

## 2024-08-24 LAB — MICROALBUMIN / CREATININE URINE RATIO
Creatinine,U: 93.3 mg/dL
Microalb Creat Ratio: UNDETERMINED mg/g (ref 0.0–30.0)
Microalb, Ur: <0.7 mg/dL

## 2024-08-24 LAB — NO CULTURE INDICATED

## 2024-08-26 ENCOUNTER — Telehealth: Payer: Self-pay

## 2024-08-26 NOTE — Telephone Encounter (Signed)
 Copied from CRM #[id number]. Topic: Clinical - Prescription Issue >> Aug 26, 2024  3:36 PM [place] J wrote: Reason for CRM: Patient's pharmacy did not receive the script for Naltrexone  HCl, Pain, 4.5 MG CAPS. The patient is needing us  to resend the script. [facility] [place], [place] [address] [place] [address] Phone: [phone] Fax: [phone] Hours: Not open 24 hours

## 2024-08-26 NOTE — Telephone Encounter (Signed)
 Forwarding message below. Please advise on request

## 2024-08-29 ENCOUNTER — Other Ambulatory Visit: Payer: Self-pay

## 2024-08-29 DIAGNOSIS — M4712 Other spondylosis with myelopathy, cervical region: Secondary | ICD-10-CM

## 2024-08-29 DIAGNOSIS — G9332 Myalgic encephalomyelitis/chronic fatigue syndrome: Secondary | ICD-10-CM

## 2024-08-29 MED ORDER — NALTREXONE HCL (PAIN) 4.5 MG PO CAPS: 1.0000 | ORAL_CAPSULE | Freq: Every day | ORAL | 1 refills | Status: DC

## 2024-08-29 NOTE — Telephone Encounter (Signed)
 Copied from CRM #[id number]. Topic: Clinical - Prescription Issue >> Aug 29, 2024 11:03 AM Rea ORN wrote: Reason for CRM: Avra with [facility] called to advise clinic that they did receive escript and fax for Naltrexone  4.5 mg. They tried to call the pt on 08/23/24 when they received the first prescription and today and the pt phone went straight to voicemail. Avra verified the pt phone number and was given secondary phone number on his chart.  Avra wanted clinic to be aware that they have been trying to contact pt about this medication.

## 2024-08-29 NOTE — Telephone Encounter (Signed)
 RX was faxed and sent to pharmacy listed below on 08/29/2024.

## 2024-08-29 NOTE — Telephone Encounter (Signed)
 Noted

## 2024-09-18 ENCOUNTER — Other Ambulatory Visit: Payer: Self-pay | Admitting: Family Medicine

## 2024-09-18 DIAGNOSIS — G2581 Restless legs syndrome: Secondary | ICD-10-CM

## 2024-09-22 ENCOUNTER — Encounter: Payer: Self-pay | Admitting: Family Medicine

## 2024-09-22 ENCOUNTER — Other Ambulatory Visit: Payer: Self-pay | Admitting: Family Medicine

## 2024-09-22 ENCOUNTER — Ambulatory Visit: Admitting: Family Medicine

## 2024-09-22 VITALS — BP 139/73 | HR 65 | Temp 97.5°F | Resp 18 | Wt 169.6 lb

## 2024-09-22 DIAGNOSIS — F331 Major depressive disorder, recurrent, moderate: Secondary | ICD-10-CM

## 2024-09-22 DIAGNOSIS — G9332 Myalgic encephalomyelitis/chronic fatigue syndrome: Secondary | ICD-10-CM

## 2024-09-22 DIAGNOSIS — Z23 Encounter for immunization: Secondary | ICD-10-CM

## 2024-09-22 DIAGNOSIS — M4712 Other spondylosis with myelopathy, cervical region: Secondary | ICD-10-CM

## 2024-09-22 DIAGNOSIS — M4722 Other spondylosis with radiculopathy, cervical region: Secondary | ICD-10-CM

## 2024-09-22 DIAGNOSIS — F19951 Other psychoactive substance use, unspecified with psychoactive substance-induced psychotic disorder with hallucinations: Secondary | ICD-10-CM

## 2024-09-22 MED ORDER — DULOXETINE HCL 30 MG PO CPEP: ORAL_CAPSULE | ORAL | 3 refills | Status: DC

## 2024-09-22 MED ORDER — CELECOXIB 200 MG PO CAPS: 200.0000 mg | ORAL_CAPSULE | Freq: Two times a day (BID) | ORAL | 3 refills | Status: AC

## 2024-09-22 MED ORDER — NALTREXONE HCL (PAIN) 4.5 MG PO CAPS: 1.0000 | ORAL_CAPSULE | Freq: Every day | ORAL | 3 refills | Status: DC

## 2024-09-22 NOTE — Progress Notes (Signed)
 Assessment & Plan   Assessment/Plan:    Assessment & Plan Chronic neck pain Chronic neck pain persists, particularly severe during sleep. Current management with celecoxib  is effective.  However patient is taking it at supratherapeutic dosing. - Advised that this level of celecoxib  can lead to significant side effects including gastrointestinal ulcers and bleeding, renal function, and cardiac risk.  Considering transition from fluoxetine  to duloxetine for enhanced pain management. -Recommend continuing celecoxib  but at doses of 200 mg twice daily - Initiate transition from fluoxetine  to duloxetine: taper fluoxetine  from 40 mg daily to 40 mg daily every other day, then you 40 mg every 2 days for one week, then every 3 days for 1 week, then 40 mg once a week for 1 week and then discontinue fluoxetine  40 mg..  - Start duloxetine at 30 mg daily, increasing to 60 mg once fluoxetine  is discontinued.  Depression Currently on fluoxetine  for depression. Transitioning to duloxetine to address both mood and chronic pain. Duloxetine's effect on serotonin and norepinephrine may improve pain management and maintain mood stability. - Initiate transition from fluoxetine  to duloxetine with the tapering schedule outlined under Chronic neck pain.  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 The combination of naltrexone  and celecoxib  is effective. - Continue low-dose naltrexone  from [facility] - Continue celecoxib  200 mg  Medication-induced visual hallucinations Experiencing vivid dreams and visual hallucinations at night, likely due to low-dose naltrexone . Benefits in reducing fatigue and restless legs syndrome outweigh disturbances. He prefers to continue the medication. - Continue low-dose naltrexone  with monitoring of visual disturbances  Weight loss Weight loss potentially related to  low-dose naltrexone , known to affect appetite and used for weight management. - Continue monitoring weight and appetite      Medications Discontinued During This Encounter  Medication Reason   cetirizine  (ZYRTEC  ALLERGY) 10 MG tablet    benzonatate  (TESSALON ) 100 MG capsule    FLUoxetine  (PROZAC ) 40 MG capsule    Naltrexone  HCl, Pain, 4.5 MG CAPS Reorder    Return in about 2 months (around 11/22/2024) for Neck pain, fatigue.        Subjective:   Encounter date: 09/22/2024  Drew Jenkins is a 58 y.o. male who has Human immunodeficiency virus I infection (HCC); Hypothyroidism, adult; RLS (restless legs syndrome); H/O syphilis; Overweight (BMI 25.0-29.9); Abnormal anal Papanicolaou smear; Lumbago; Depression; GERD (gastroesophageal reflux disease); Screening examination for venereal disease; Encounter for long-term (current) use of high-risk medication; Medication monitoring encounter; Fatigue associated with AIDS (HCC); Vertigo; Shortness of breath; Moderate episode of recurrent major depressive disorder (HCC); Pure hypercholesterolemia; Hemorrhoids; HIV disease (HCC); Medication management; Health care maintenance; Immunization counseling; Chronic pain of both shoulders; Chronic neck pain; Asymptomatic microscopic hematuria; 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   He  has a past medical history of Allergy (Na), Anxiety (NA), Depression (NA), GERD (gastroesophageal reflux disease) (NA), Heart murmur (1985), HIV infection (HCC), Neuromuscular disorder (HCC) (NA), and Thyroid  disease (1965).[name]   He presents with chief complaint of Neck Pain (Pt stated neck pain is still present; currently seeing Ortho and Rx that was prescribed. //HM due- flu vaccine) and Fatigue (Pt stated fatigue has  improved ) .   Discussed the use of AI scribe software for clinical note transcription with the patient, who gave verbal consent to proceed.  History of Present  Illness Drew Jenkins is a 58 year old male who presents with fatigue and neck pain.  Fatigue - Ongoing fatigue present - Low dose naltrexone  taken once daily has been beneficial for fatigue - Naltrexone  causes vivid, 'wild' dreams, but improvement in fatigue outweighs this side effect - Reduction in appetite attributed to naltrexone  - Weight loss of four pounds since starting naltrexone   Neck pain - Severe neck pain, particularly when in bed - Initial pain medication at 200 mg was ineffective and discontinued - Subsequent combination with another medication from an apothecary effectively managed symptoms - Celecoxib  1200 mg daily (divided into three doses in the morning and three at night) currently used for pain management - Use of orthopedic pillow attempted for symptom relief  Restless legs - Naltrexone  has improved restless leg symptoms - Reduced need for mustard at night for symptom control  Mood disturbance - Fluoxetine  taken for mood, ongoing for an extended period  Gastrointestinal symptoms - Pantoprazole  taken as needed for heartburn - No significant stomach issues reported     ROS  No past surgical history on file.  Outpatient Medications Prior to Visit  Medication Sig Dispense Refill   albuterol  (VENTOLIN  HFA) 108 (90 Base) MCG/ACT inhaler SMARTSIG:2 Puff(s) By Mouth Every 4 Hours PRN     atorvastatin  (LIPITOR) 10 MG tablet Take 1 tablet (10 mg total) by mouth daily. 30 tablet 5   bictegravir-emtricitabine-tenofovir AF (BIKTARVY ) 50-200-25 MG TABS tablet TAKE 1 TABLET BY MOUTH 1 TIME A DAY. 90 tablet 3   Cyanocobalamin (VITAMIN B-12) 5000 MCG LOZG Take 1,000 tablets by mouth daily.     levothyroxine  (SYNTHROID ) 175 MCG tablet TAKE 1 TABLET BY MOUTH EVERY DAY 90 tablet 3   meclizine  (ANTIVERT ) 25 MG  tablet Take 1 tablet (25 mg total) by mouth 3 (three) times daily as needed for dizziness. 30 tablet 1   Multiple Vitamin (MULTIVITAMIN) tablet Take 1 tablet by mouth daily.     pantoprazole  (PROTONIX ) 20 MG tablet Take 1 tablet (20 mg total) by mouth daily. 90 tablet 3   tiZANidine  (ZANAFLEX ) 4 MG tablet Take 1 tablet (4 mg total) by mouth every 6 (six) hours as needed for muscle spasms (or pain). 360 tablet 3   FLUoxetine  (PROZAC ) 40 MG capsule Take 1 capsule (40 mg total) by mouth daily. 90 capsule 2   Naltrexone  HCl, Pain, 4.5 MG CAPS Take 1 capsule by mouth daily. 30 capsule 1   benzonatate  (TESSALON ) 100 MG capsule Take 1 capsule (100 mg total) by mouth 3 (three) times daily as needed. (Patient not taking: Reported on 08/23/2024) 20 capsule 0   cetirizine  (ZYRTEC  ALLERGY) 10 MG tablet Take 1 tablet (10 mg total) by mouth daily. 30 tablet 0   No facility-administered medications prior to visit.    Family History  Problem Relation Age of Onset   Diabetes Mother    Arthritis/Rheumatoid Mother    Hypertension Father     Social History   Socioeconomic History   Marital status: Divorced    Spouse name: Not on file   Number of children: Not on file   Years of education: Not on file   Highest education level: 12th grade  Occupational History   Not on file  Tobacco Use   Smoking status: Never    Passive exposure: Past   Smokeless tobacco: Never  Vaping Use   Vaping status: Never Used  Substance and Sexual Activity   Alcohol use: No   Drug use: No  Sexual activity: Not Currently    Comment: pt declined condoms  Other Topics Concern   Not on file  Social History Narrative   Not on file   Social Drivers of Health   Financial Resource Strain: Low Risk  (06/28/2024)   Overall Financial Resource Strain (CARDIA)    Difficulty of Paying Living Expenses: Not hard at all  Food Insecurity: No Food Insecurity (06/28/2024)   Hunger Vital Sign    Worried About Running Out of Food in  the Last Year: Never true    Ran Out of Food in the Last Year: Never true  Transportation Needs: No Transportation Needs (06/28/2024)   PRAPARE - Administrator, Civil Service (Medical): No    Lack of Transportation (Non-Medical): No  Physical Activity: Inactive (06/28/2024)   Exercise Vital Sign    Days of Exercise per Week: 0 days    Minutes of Exercise per Session: Not on file  Stress: Stress Concern Present (06/28/2024)   [employer] of Occupational Health - Occupational Stress Questionnaire    Feeling of Stress: Very much  Social Connections: Moderately Isolated (06/28/2024)   Social Connection and Isolation Panel    Frequency of Communication with Friends and Family: More than three times a week    Frequency of Social Gatherings with Friends and Family: Patient declined    Attends Religious Services: 1 to 4 times per year    Active Member of [employer] or Organizations: No    Attends Engineer, structural: Not on file    Marital Status: Divorced  Catering manager Violence: Not on file                                                                                                  Objective:  Physical Exam: BP 139/73 (BP Location: Left Arm, Patient Position: Sitting, Cuff Size: Large)   Pulse 65   Temp (!) 97.5 F (36.4 C) (Temporal)   Resp 18   Wt 169 lb 9.6 oz (76.9 kg)   SpO2 98%   BMI 29.11 kg/m    Physical Exam GENERAL: Alert, cooperative, well developed, well nourished, no acute distress HEENT: Normocephalic, normal oropharynx, moist mucous membranes CHEST: Clear to auscultation bilaterally, no wheezes, rhonchi, or crackles CARDIOVASCULAR: Normal heart rate and rhythm, S1 and S2 normal without murmurs ABDOMEN: Soft, non-tender, non-distended, without organomegaly, normal bowel sounds EXTREMITIES: No cyanosis or edema NEUROLOGICAL: Cranial nerves grossly intact, moves all extremities without gross motor or sensory deficit   Physical  Exam  No results found.  Recent Results (from the past 2160 hours)  TSH Rfx on Abnormal to Free T4     Status: None   Collection Time: 07/01/24  3:08 PM  Result Value Ref Range   TSH 2.030 0.450 - 4.500 uIU/mL  POCT Urinalysis Dipstick (Automated)     Status: Abnormal   Collection Time: 07/22/24  4:35 PM  Result Value Ref Range   Color, UA     Clarity, UA     Glucose, UA Negative Negative   Bilirubin, UA negative    Ketones, UA  negative    Spec Grav, UA 1.020 1.010 - 1.025   Blood, UA postive    pH, UA 6.0 5.0 - 8.0   Protein, UA Positive (R) Negative   Urobilinogen, UA 0.2 0.2 or 1.0 E.U./dL   Nitrite, UA negative    Leukocytes, UA Negative Negative  Urinalysis w microscopic + reflex cultur     Status: None   Collection Time: 08/23/24  4:00 PM   Specimen: Urine  Result Value Ref Range   Color, Urine YELLOW YELLOW   APPearance CLEAR CLEAR   Specific Gravity, Urine 1.015 1.001 - 1.035   pH 7.0 5.0 - 8.0   Glucose, UA NEGATIVE NEGATIVE   Bilirubin Urine NEGATIVE NEGATIVE   Ketones, ur NEGATIVE NEGATIVE   Hgb urine dipstick NEGATIVE NEGATIVE   Protein, ur NEGATIVE NEGATIVE   Nitrites, Initial NEGATIVE NEGATIVE   Leukocyte Esterase NEGATIVE NEGATIVE   WBC, UA NONE SEEN 0 - 5 /HPF   RBC / HPF NONE SEEN 0 - 2 /HPF   Squamous Epithelial / HPF NONE SEEN < OR = 5 /HPF   Bacteria, UA NONE SEEN NONE SEEN /HPF   Hyaline Cast NONE SEEN NONE SEEN /LPF   Note      Comment: This urine was analyzed for the presence of WBC,  RBC, bacteria, casts, and other formed elements.  Only those elements seen were reported. [name] .   Microalbumin / creatinine urine ratio     Status: None   Collection Time: 08/23/24  4:00 PM  Result Value Ref Range   Microalb, Ur <0.7 mg/dL   Creatinine,U 06.6 mg/dL   Microalb Creat Ratio Unable to calculate 0.0 - 30.0 mg/g    Comment: Unable to Calculate due to Microalbumin Result of <0.7 mg/dL  PSA     Status: None   Collection Time: 08/23/24  4:00 PM   Result Value Ref Range   PSA 0.44 0.10 - 4.00 ng/mL    Comment: Test performed using Access Hybritech PSA Assay, a parmagnetic partical, chemiluminecent immunoassay.  REFLEXIVE URINE CULTURE     Status: None   Collection Time: 08/23/24  4:00 PM  Result Value Ref Range   Reflexve Urine Culture      Comment: NO CULTURE INDICATED        Beverley Adine Hummer, MD, MS

## 2024-09-22 NOTE — Patient Instructions (Addendum)
  VISIT SUMMARY: During your visit, we discussed your ongoing fatigue, neck pain, restless legs, mood disturbance, and gastrointestinal symptoms. We reviewed your current medications and made some adjustments to better manage your symptoms.  YOUR PLAN: CHRONIC NECK PAIN: You have severe neck pain, especially at night. Your current medication, celecoxib , is helping manage the pain. -Refill celecoxib  200 mg twice daily as needed for pain  -We will transition you from fluoxetine  to duloxetine to help with both pain and mood. Taper fluoxetine  from 40 mg every other daily for two weeks, then 40 mg every other day for two week, then 40 mg once a week for two weeks, and then stop taking fluoxetine  40 mg. Start duloxetine at 1 capsule (30 mg total daily, increasing to 2 capsules (60 mg total) once fluoxetine  is discontinued.  DEPRESSION: You are currently taking fluoxetine  for depression. We are transitioning you to duloxetine to help with both mood and chronic pain. -Follow the tapering schedule for fluoxetine  and start duloxetine as outlined under Chronic neck pain.  FATIGUE AND RESTLESS LEGS SYNDROME: Your fatigue and restless legs syndrome have improved with low-dose naltrexone  and celecoxib . -Continue taking low-dose naltrexone  from [place]. -Continue taking tizanidine  4 mg   MEDICATION-INDUCED VISUAL HALLUCINATIONS: You are experiencing vivid dreams and visual hallucinations at night, likely due to low-dose naltrexone . However, the benefits outweigh these disturbances. -Continue taking low-dose naltrexone  and monitor for visual disturbances.  WEIGHT LOSS: You have experienced some weight loss, potentially related to low-dose naltrexone , which can affect appetite. -Continue monitoring your weight and appetite.                      Contains text generated by Abridge.                                 Contains text generated by Abridge.

## 2024-09-23 ENCOUNTER — Other Ambulatory Visit: Payer: Self-pay

## 2024-09-23 DIAGNOSIS — G9332 Myalgic encephalomyelitis/chronic fatigue syndrome: Secondary | ICD-10-CM

## 2024-09-23 DIAGNOSIS — M4712 Other spondylosis with myelopathy, cervical region: Secondary | ICD-10-CM

## 2024-09-23 MED ORDER — NALTREXONE HCL (PAIN) 4.5 MG PO CAPS: 1.0000 | ORAL_CAPSULE | Freq: Every day | ORAL | 3 refills | Status: DC

## 2024-09-24 DIAGNOSIS — F19951 Other psychoactive substance use, unspecified with psychoactive substance-induced psychotic disorder with hallucinations: Secondary | ICD-10-CM | POA: Insufficient documentation

## 2024-09-26 NOTE — Telephone Encounter (Signed)
 MyChart message read by pt.

## 2024-10-03 ENCOUNTER — Other Ambulatory Visit: Payer: Self-pay

## 2024-10-03 DIAGNOSIS — M4712 Other spondylosis with myelopathy, cervical region: Secondary | ICD-10-CM

## 2024-10-03 DIAGNOSIS — G9332 Myalgic encephalomyelitis/chronic fatigue syndrome: Secondary | ICD-10-CM

## 2024-10-03 MED ORDER — NALTREXONE HCL (PAIN) 4.5 MG PO CAPS: 1.0000 | ORAL_CAPSULE | Freq: Every day | ORAL | 3 refills | Status: AC

## 2024-10-16 ENCOUNTER — Other Ambulatory Visit: Payer: Self-pay | Admitting: Family Medicine

## 2024-10-16 DIAGNOSIS — F331 Major depressive disorder, recurrent, moderate: Secondary | ICD-10-CM

## 2024-10-16 DIAGNOSIS — M4712 Other spondylosis with myelopathy, cervical region: Secondary | ICD-10-CM

## 2024-10-17 ENCOUNTER — Encounter: Payer: Self-pay | Admitting: Radiology

## 2024-11-13 ENCOUNTER — Other Ambulatory Visit: Payer: Self-pay | Admitting: Infectious Diseases

## 2024-11-13 DIAGNOSIS — E7800 Pure hypercholesterolemia, unspecified: Secondary | ICD-10-CM

## 2024-11-13 DIAGNOSIS — Z21 Asymptomatic human immunodeficiency virus [HIV] infection status: Secondary | ICD-10-CM

## 2024-11-22 ENCOUNTER — Ambulatory Visit: Admitting: Family Medicine

## 2024-11-22 NOTE — Progress Notes (Incomplete)
 Assessment & Plan   Assessment/Plan:    Problem List Items Addressed This Visit   None       Assessment and Plan               There are no discontinued medications.  No follow-ups on file.        Subjective:   Encounter date: 11/22/2024  Drew Jenkins is a 58 y.o. male who has Human immunodeficiency virus I infection (HCC); Hypothyroidism, adult; RLS (restless legs syndrome); H/O syphilis; Overweight (BMI 25.0-29.9); Abnormal anal Papanicolaou smear; Lumbago; Depression; GERD (gastroesophageal reflux disease); Screening examination for venereal disease; Encounter for long-term (current) use of high-risk medication; Medication monitoring encounter; Fatigue associated with AIDS (HCC); Vertigo; Shortness of breath; Moderate episode of recurrent major depressive disorder (HCC); Pure hypercholesterolemia; Hemorrhoids; HIV disease (HCC); Medication management; Health care maintenance; Immunization counseling; Chronic pain of both shoulders; Chronic neck pain; Asymptomatic microscopic hematuria; 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   He  has a past medical history of Allergy (Na), Anxiety (NA), Depression (NA), GERD (gastroesophageal reflux disease) (NA), Heart murmur (1985), HIV infection (HCC), Neuromuscular disorder (HCC) (NA), and Thyroid  disease (1965).Drew Jenkins   He presents with chief complaint of No chief complaint on file. .   Discussed the use of AI scribe software for clinical note transcription with the patient, who gave verbal consent to proceed.  History of Present Illness           Chronic neck pain - At last office visit, 09/22/2024, started transitioning him from Fluoxetine  to Duloxetine  - Management otherwise includes celecoxib  200 mg twice  daily.  Depression - Managed with transition from fluoxetine  to duloxetine .  ROS  No past surgical history on file.  Current Outpatient Medications on File Prior to Visit  Medication Sig Dispense Refill   albuterol  (VENTOLIN  HFA) 108 (90 Base) MCG/ACT inhaler SMARTSIG:2 Puff(s) By Mouth Every 4 Hours PRN     atorvastatin  (LIPITOR) 10 MG tablet TAKE 1 TABLET DAILY 90 tablet 1   bictegravir-emtricitabine-tenofovir AF (BIKTARVY ) 50-200-25 MG TABS tablet TAKE 1 TABLET BY MOUTH 1 TIME A DAY. 90 tablet 3   celecoxib  (CELEBREX ) 200 MG capsule Take 1 capsule (200 mg total) by mouth 2 (two) times daily. 180 capsule 3   Cyanocobalamin (VITAMIN B-12) 5000 MCG LOZG Take 1,000 tablets by mouth daily.     DULoxetine  (CYMBALTA ) 30 MG capsule START DULOXETINE  AT 1 CAPSULE (30 MG TOTAL) BY MOUTH DAILY, THEN TAKE 2 CAPSULES (60 MG TOTAL) DAILY WHEN FLUOXETINE  IS DISCONTINUED. 180 capsule 0   levothyroxine  (SYNTHROID ) 175 MCG tablet TAKE 1 TABLET BY MOUTH EVERY DAY 90 tablet 3   meclizine  (ANTIVERT ) 25 MG tablet Take 1 tablet (25 mg total) by mouth 3 (three) times daily as needed for dizziness. 30 tablet 1   Multiple Vitamin (MULTIVITAMIN) tablet Take 1 tablet by mouth daily.     pantoprazole  (PROTONIX ) 20 MG tablet Take 1 tablet (20 mg total) by mouth daily. 90 tablet 3   tiZANidine  (ZANAFLEX ) 4 MG tablet Take 1 tablet (4 mg total) by mouth every 6 (six) hours as needed for muscle spasms (or pain). 360 tablet 3   No current facility-administered medications on file prior to visit.    Family History  Problem Relation Age of Onset   Diabetes Mother    Arthritis/Rheumatoid Mother    Hypertension Father  Social History   Socioeconomic History   Marital status: Divorced    Spouse name: Not on file   Number of children: Not on file   Years of education: Not on file   Highest education level: 12th grade  Occupational History   Not on file  Tobacco Use   Smoking status: Never    Passive  exposure: Past   Smokeless tobacco: Never  Vaping Use   Vaping status: Never Used  Substance and Sexual Activity   Alcohol use: No   Drug use: No   Sexual activity: Not Currently    Comment: pt declined condoms  Other Topics Concern   Not on file  Social History Narrative   Not on file   Social Drivers of Health   Financial Resource Strain: Low Risk  (06/28/2024)   Overall Financial Resource Strain (CARDIA)    Difficulty of Paying Living Expenses: Not hard at all  Food Insecurity: No Food Insecurity (06/28/2024)   Hunger Vital Sign    Worried About Running Out of Food in the Last Year: Never true    Ran Out of Food in the Last Year: Never true  Transportation Needs: No Transportation Needs (06/28/2024)   PRAPARE - Administrator, Civil Service (Medical): No    Lack of Transportation (Non-Medical): No  Physical Activity: Inactive (06/28/2024)   Exercise Vital Sign    Days of Exercise per Week: 0 days    Minutes of Exercise per Session: Not on file  Stress: Stress Concern Present (06/28/2024)   Drew [employer] of Occupational Health - Occupational Stress Questionnaire    Feeling of Stress: Very much  Social Connections: Moderately Isolated (06/28/2024)   Social Connection and Isolation Panel    Frequency of Communication with Friends and Family: More than three times a week    Frequency of Social Gatherings with Friends and Family: Patient declined    Attends Religious Services: 1 to 4 times per year    Active Member of [employer] or Organizations: No    Attends Engineer, Structural: Not on file    Marital Status: Divorced  Intimate Partner Violence: Not on file                                                                                                  Objective:  Physical Exam: There were no vitals taken for this visit.   Physical Exam           Physical Exam  No results found.  No results found for this or any previous visit (from the past  2160 hours).      Beverley KATHEE Hummer, MD  I,Emily Lagle,acting as a scribe for Beverley KATHEE Hummer, MD.,have documented all relevant documentation on the behalf of Beverley KATHEE Hummer, MD.  Drew Jenkins Beverley KATHEE Hummer, MD, have reviewed all documentation for this visit. The documentation on 11/22/2024 for the exam, diagnosis, procedures, and orders are all accurate and complete.

## 2024-11-25 ENCOUNTER — Telehealth: Payer: Self-pay | Admitting: Family Medicine

## 2024-11-25 NOTE — Telephone Encounter (Signed)
 Unable to pend

## 2024-11-25 NOTE — Telephone Encounter (Unsigned)
 Copied from CRM #[id number]. Topic: Clinical - Medication Refill >> Nov 25, 2024  3:14 PM Paige D wrote: Medication: Naltrexone  HCl, Pain, 4.5 MG CAPS (Pt is requesting if this can get refilled)   Has the patient contacted their pharmacy? No (Agent: If no, request that the patient contact the pharmacy for the refill. If patient does not wish to contact the pharmacy document the reason why and proceed with request.) (Agent: If yes, when and what did the pharmacy advise?)  This is the patient's preferred pharmacy:    [facility] [place], [place] [address] [place] [address] Phone: [phone] Fax: [phone]   Is this the correct pharmacy for this prescription? Yes If no, delete pharmacy and type the correct one.   Has the prescription been filled recently? No  Is the patient out of the medication? Yes  Has the patient been seen for an appointment in the last year OR does the patient have an upcoming appointment? Yes  Can we respond through MyChart? No  Agent: Please be advised that Rx refills may take up to 3 business days. We ask that you follow-up with your pharmacy.

## 2024-11-29 NOTE — Telephone Encounter (Signed)
 Requesting: Naltrexone  HCl, Pain, 4.5 MG CAPS  Last Visit: 10/16/2024 Next Visit: 01/12/2025 Last Refill: 10/03/2024  Please Advise

## 2024-11-29 NOTE — Progress Notes (Signed)
 This encounter was created in error - please disregard.

## 2024-11-30 ENCOUNTER — Telehealth: Payer: Self-pay

## 2024-11-30 NOTE — Telephone Encounter (Signed)
 Copied from CRM #[id number]. Topic: Clinical - Prescription Issue >> Nov 29, 2024  3:56 PM Berneda FALCON wrote: Reason for CRM: Patient states he called in on Friday for his Medication: Naltrexone  HCl and it has not yet been refilled. I did let him know that medications can take up to 3 business days to get refilled but he would like a status update please.  Preferred pharmacy: [facility] - [place], [place] [address] [place] [address] Phone: [phone] Fax: [phone] Hours: Not open 24 hours   Patient callback-[phone] (home) >> Nov 29, 2024  4:42 PM Dedra B wrote: Pt called to follow up refill request for Naltrexone . He said he is completely out and it's taking a toll on his body.

## 2024-11-30 NOTE — Telephone Encounter (Unsigned)
 Copied from CRM #[id number]. Topic: Clinical - Prescription Issue >> Nov 29, 2024  3:56 PM Berneda FALCON wrote: Reason for CRM: Patient states he called in on Friday for his Medication: Naltrexone  HCl and it has not yet been refilled. I did let him know that medications can take up to 3 business days to get refilled but he would like a status update please.  Preferred pharmacy: [facility] - [place], [place] [address] [place] [address] Phone: [phone] Fax: [phone] Hours: Not open 24 hours   Patient [phone] (home) >> Nov 30, 2024 10:42 AM China J wrote: The patient is returning a call he received from the clinic. I could not find any notes from anyone in the clinic. The patient said that his body is now feeling very, very tired ever since he's been without the medication.  UPDATE: Patient could not wait on hold for triage any longer. He said he felt too tired to keep himself awake and that he is also not feeling too well. He would like if the triage nurse could call him and leave a voicemail of the call just in case he doesn't wake up. >> Nov 29, 2024  4:42 PM Dedra B wrote: Pt called to follow up refill request for Naltrexone . He said he is completely out and it's taking a toll on his body.

## 2024-12-01 ENCOUNTER — Other Ambulatory Visit: Payer: Self-pay | Admitting: Family Medicine

## 2024-12-01 DIAGNOSIS — M4712 Other spondylosis with myelopathy, cervical region: Secondary | ICD-10-CM

## 2024-12-01 DIAGNOSIS — G9332 Myalgic encephalomyelitis/chronic fatigue syndrome: Secondary | ICD-10-CM

## 2024-12-01 MED ORDER — NALTREXONE HCL (PAIN) 4.5 MG PO CAPS: 1.0000 | ORAL_CAPSULE | Freq: Every day | ORAL | 3 refills | Status: AC

## 2024-12-01 NOTE — Telephone Encounter (Signed)
 Refill sent

## 2024-12-03 ENCOUNTER — Ambulatory Visit

## 2024-12-03 ENCOUNTER — Encounter: Payer: Self-pay | Admitting: Emergency Medicine

## 2024-12-03 ENCOUNTER — Ambulatory Visit
Admission: EM | Admit: 2024-12-03 | Discharge: 2024-12-03 | Disposition: A | Discharge status: Home / Self Care | Attending: Student | Admitting: Student

## 2024-12-03 DIAGNOSIS — R82998 Other abnormal findings in urine: Secondary | ICD-10-CM | POA: Diagnosis not present

## 2024-12-03 DIAGNOSIS — J029 Acute pharyngitis, unspecified: Secondary | ICD-10-CM

## 2024-12-03 LAB — POC COVID19/FLU A&B COMBO
Covid Antigen, POC: NEGATIVE
Influenza A Antigen, POC: NEGATIVE
Influenza B Antigen, POC: NEGATIVE

## 2024-12-03 LAB — POCT URINE DIPSTICK
Bilirubin, UA: NEGATIVE
Glucose, UA: NEGATIVE mg/dL
Ketones, POC UA: NEGATIVE mg/dL
Leukocytes, UA: NEGATIVE
Nitrite, UA: NEGATIVE
Protein Ur, POC: NEGATIVE mg/dL
Spec Grav, UA: 1.015
Urobilinogen, UA: 0.2 U/dL
pH, UA: 5.5

## 2024-12-03 LAB — POCT RAPID STREP A (OFFICE): Rapid Strep A Screen: NEGATIVE

## 2024-12-03 NOTE — ED Triage Notes (Signed)
 Sore throat, chills since yesterday.

## 2024-12-03 NOTE — ED Provider Notes (Signed)
 " [facility]    CSN: [id number] Arrival date & time: 12/03/24  9053      History   Chief Complaint No chief complaint on file.   HPI Drew Jenkins is a 58 y.o. male presenting w viral syndrome. H/o HIV. Notes sore throat, chills, cough. Notes PND and cough occasionally productive of yellow sputum. Endorses DOE. Minimal SOB at rest.  Mentions dark urine. Denies dysuria, hematuria. Denies h/o kidney issues.   Denies abd pain. Denies pedal edema, unilateral leg swelling.  Denies recent travel, trauma, prolonged immobilization.  Does not have a history of DVT or PE.  Has attempted cherry flavored OTC medication.   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   Asymptomatic microscopic hematuria 07/26/2024   Isolated proteinuria without specific morphologic lesion 07/26/2024   Chronic pain of both shoulders 07/22/2024   Chronic neck pain 07/22/2024   Immunization counseling 05/21/2024   HIV disease (HCC) 05/19/2024   Medication management 05/19/2024   Health care maintenance 05/19/2024   Moderate episode of recurrent major depressive disorder (HCC) 07/03/2023   Pure hypercholesterolemia 07/03/2023   Hemorrhoids 07/03/2023   Shortness of breath 04/30/2023   Vertigo 09/02/2022   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 No pertinent surgical history.     Home Medications    Prior to Admission medications  Medication Sig Start Date End Date Taking? Authorizing Provider  albuterol  (VENTOLIN  HFA) 108 (90 Base) MCG/ACT inhaler SMARTSIG:2 Puff(s) By Mouth Every 4 Hours PRN 09/19/20   [provider]  atorvastatin  (LIPITOR) 10 MG tablet TAKE 1 TABLET DAILY 11/14/24   Dea Shiner, MD  bictegravir-emtricitabine-tenofovir AF (BIKTARVY ) 50-200-25 MG TABS tablet TAKE 1 TABLET BY MOUTH 1 TIME A DAY. 05/19/24   Dea Shiner, MD  celecoxib  (CELEBREX ) 200 MG capsule Take 1 capsule (200 mg total) by mouth 2 (two) times daily. 09/22/24 09/17/25  Sebastian Beverley NOVAK, MD  Cyanocobalamin (VITAMIN B-12) 5000 MCG LOZG Take 1,000 tablets by mouth daily.    [provider]  DULoxetine  (CYMBALTA ) 30 MG capsule START DULOXETINE  AT 1 CAPSULE (30 MG TOTAL) BY MOUTH DAILY, THEN TAKE 2 CAPSULES (60 MG TOTAL) DAILY WHEN FLUOXETINE  IS DISCONTINUED. 10/19/24   Sebastian Beverley NOVAK, MD  levothyroxine  (SYNTHROID ) 175 MCG tablet TAKE 1 TABLET BY MOUTH EVERY DAY 06/21/24   Sebastian Beverley NOVAK, MD  meclizine  (ANTIVERT ) 25 MG tablet Take 1 tablet (25 mg total) by mouth 3 (three) times daily as needed for dizziness. 07/03/23   Sebastian,  Beverley NOVAK, MD  Multiple Vitamin (MULTIVITAMIN) tablet Take 1 tablet by mouth daily.    [provider]  Naltrexone  HCl, Pain, 4.5 MG CAPS Take 1 capsule by mouth daily. 12/01/24 11/26/25  Sebastian Beverley NOVAK, MD  pantoprazole  (PROTONIX ) 20 MG tablet Take 1 tablet (20 mg total) by mouth daily. 07/03/23    Sebastian Beverley NOVAK, MD  tiZANidine  (ZANAFLEX ) 4 MG tablet Take 1 tablet (4 mg total) by mouth every 6 (six) hours as needed for muscle spasms (or pain). 07/01/24 06/26/25  Sebastian Beverley NOVAK, MD    Family History Family History  Problem Relation Age of Onset   Diabetes Mother    Arthritis/Rheumatoid Mother    Hypertension Father     Social History Social History[1]   Allergies   Oxycodone    Review of Systems Review of Systems  Constitutional:  Negative for appetite change, chills and fever.  HENT:  Positive for sore throat. Negative for congestion, ear pain, rhinorrhea, sinus pressure and sinus pain.   Eyes:  Negative for redness and visual disturbance.  Respiratory:  Positive for cough and shortness of breath. Negative for chest tightness and wheezing.   Cardiovascular:  Negative for chest pain and palpitations.  Gastrointestinal:  Negative for abdominal pain, constipation, diarrhea, nausea and vomiting.  Genitourinary:  Negative for dysuria, frequency and urgency.  Musculoskeletal:  Negative for myalgias.  Neurological:  Negative for dizziness, weakness and headaches.  Psychiatric/Behavioral:  Negative for confusion.   All other systems reviewed and are negative.    Physical Exam Triage Vital Signs ED Triage Vitals  Encounter Vitals Group     BP 12/03/24 1057 124/79     Girls Systolic BP Percentile --      Girls Diastolic BP Percentile --      Boys Systolic BP Percentile --      Boys Diastolic BP Percentile --      Pulse Rate 12/03/24 1057 74     Resp 12/03/24 1057 18     Temp 12/03/24 1057 98.1 F (36.7 C)     Temp Source 12/03/24 1057 Oral     SpO2 12/03/24 1057 95 %     Weight --      Height --      Head Circumference --      Peak Flow --      Pain Score 12/03/24 1058 9     Pain Loc --      Pain Education --      Exclude from Growth Chart --    No data found.  Updated Vital Signs BP 124/79 (BP Location: Right Arm)   Pulse 74   Temp 98.1 F (36.7 C)  (Oral)   Resp 18   SpO2 95%   Visual Acuity Right Eye Distance:   Left Eye Distance:   Bilateral Distance:    Right Eye Near:   Left Eye Near:    Bilateral Near:     Physical Exam Vitals reviewed.  Constitutional:      General: He is not in acute distress.    Appearance: Normal appearance. He is not ill-appearing.  HENT:     Head: Normocephalic and atraumatic.     Right Ear: Tympanic membrane, ear canal and external ear normal. No tenderness. No middle ear effusion. There is no impacted cerumen. Tympanic membrane is not perforated, erythematous, retracted or bulging.     Left Ear: Tympanic membrane, ear canal and external ear normal. No tenderness.  No middle ear effusion. There is  no impacted cerumen. Tympanic membrane is not perforated, erythematous, retracted or bulging.     Nose: Nose normal. No congestion.     Mouth/Throat:     Mouth: Mucous membranes are moist.     Pharynx: Uvula midline. No oropharyngeal exudate or posterior oropharyngeal erythema.     Tonsils: No tonsillar exudate.  Eyes:     Extraocular Movements: Extraocular movements intact.     Pupils: Pupils are equal, round, and reactive to light.  Cardiovascular:     Rate and Rhythm: Normal rate and regular rhythm.     Heart sounds: Normal heart sounds.  Pulmonary:     Effort: Pulmonary effort is normal.     Breath sounds: Normal breath sounds. No decreased breath sounds, wheezing, rhonchi or rales.  Abdominal:     Palpations: Abdomen is soft.     Tenderness: There is no abdominal tenderness. There is no guarding or rebound.  Musculoskeletal:     Comments: NO pedal edema  Lymphadenopathy:     Cervical: No cervical adenopathy.     Right cervical: No superficial, deep or posterior cervical adenopathy.    Left cervical: No superficial, deep or posterior cervical adenopathy.  Skin:    Comments: No rash   Neurological:     General: No focal deficit present.     Mental Status: He is alert and oriented to  person, place, and time.  Psychiatric:        Mood and Affect: Mood normal.        Behavior: Behavior normal.        Thought Content: Thought content normal.        Judgment: Judgment normal.      UC Treatments / Results  Labs (all labs ordered are listed, but only abnormal results are displayed) Labs Reviewed  POCT URINE DIPSTICK - Abnormal; Notable for the following components:      Result Value   Blood, UA trace-intact (*)    All other components within normal limits  POC COVID19/FLU A&B COMBO - Normal  CULTURE, GROUP A STREP [facility])  BASIC METABOLIC PANEL WITH GFR  POCT RAPID STREP A (OFFICE)    EKG   Radiology DG Chest 2 View Result Date: 12/03/2024 CLINICAL DATA:  Shortness of breath.  Dark urine.  History of HIV. EXAM: CHEST - 2 VIEW COMPARISON:  08/18/2022 FINDINGS: The cardiomediastinal contours are stable. Bronchial thickening. Pulmonary vasculature is normal. No consolidation, pleural effusion, or pneumothorax. No acute osseous abnormalities are seen. IMPRESSION: Bronchial thickening, no confluent opacity. Electronically Signed   By: Andrea Gasman M.D.   On: 12/03/2024 13:36    Procedures Procedures (including critical care time)  Medications Ordered in UC Medications - No data to display  Initial Impression / Assessment and Plan / UC Course  I have reviewed the triage vital signs and the nursing notes.  Pertinent labs & imaging results that were available during my care of the patient were reviewed by me and considered in my medical decision making (see chart for details).     Patient is a pleasant 58 y.o. male presenting with viral pharyngitis. The patient is afebrile and nontachycardic.  Antipyretic has not been administered today.   Pt w h/o HIV. HIV is monitored by infectious disease. Last HIV RNA lab (05/2024) was non-detectable.  -Covid negative -Influenza negative -Rapid strep negative, culture sent.  The patient incidentally mentions dark  urine. He is not having other urinary symptoms. UA with trace blood. Chart review indicates history Asymptomatic  microscopic hematuria.  We are also checking a BMP today.  Patient endorses shortness of breath. Lungs are clear to auscultation.  CXR: Bronchial thickening, no confluent opacity.   NO h/o DVT or PE. Wells score for PE is 0.  I offered to send viscous lidocaine  for his sore throat.  He declines. Will manage with OTC medications as below.  Final Clinical Impressions(s) / UC Diagnoses   Final diagnoses:  Viral pharyngitis  Dark urine     Discharge Instructions      -Your COVID and influenza tests were negative. -You have a virus, like the common cold.  Viruses typically last 5 to 7 days.  After 7 days, your symptoms should be improving rather than worsening.  If your symptoms improve, and then worsen again, this is when we worry about a sinus infection or a lung infection, and you should return for additional care. -Tylenol  for throat pain, Follow the dosage instructions on the bottle -You can also try hot tea with honey, throat lozenges, etc     ED Prescriptions   None    PDMP not reviewed this encounter.     [1]  Social History Tobacco Use   Smoking status: Never    Passive exposure: Past   Smokeless tobacco: Never  Vaping Use   Vaping status: Never Used  Substance Use Topics   Alcohol use: No   Drug use: No     Arlyss Leita BRAVO, PA-C 12/03/24 1421  "

## 2024-12-03 NOTE — Discharge Instructions (Addendum)
-  Your COVID and influenza tests were negative. -You have a virus, like the common cold.  Viruses typically last 5 to 7 days.  After 7 days, your symptoms should be improving rather than worsening.  If your symptoms improve, and then worsen again, this is when we worry about a sinus infection or a lung infection, and you should return for additional care. -Tylenol  for throat pain, Follow the dosage instructions on the bottle -You can also try hot tea with honey, throat lozenges, etc

## 2024-12-04 LAB — BASIC METABOLIC PANEL WITH GFR
BUN/Creatinine Ratio: 11 (ref 10–24)
BUN: 12 mg/dL (ref 8–27)
CO2: 21 mmol/L (ref 20–29)
Calcium: 8.9 mg/dL (ref 8.6–10.2)
Chloride: 102 mmol/L (ref 96–106)
Creatinine, Ser: 1.08 mg/dL (ref 0.76–1.27)
Glucose: 88 mg/dL (ref 70–99)
Potassium: 3.9 mmol/L (ref 3.5–5.2)
Sodium: 140 mmol/L (ref 134–144)
eGFR: 79 mL/min/1.73

## 2024-12-05 ENCOUNTER — Ambulatory Visit (HOSPITAL_COMMUNITY): Payer: Self-pay

## 2024-12-05 NOTE — Progress Notes (Signed)
 The 10-year ASCVD risk score (Arnett DK, et al., 2019) is: 8.1%   Values used to calculate the score:     Age: 58 years     Clinically relevant sex: Male     Is Non-Hispanic African American: No     Diabetic: No     Tobacco smoker: No     Systolic Blood Pressure: 124 mmHg     Is BP treated: No     HDL Cholesterol: 36 mg/dL     Total Cholesterol: 152 mg/dL  Currently prescribed atorvastatin  10 mg.  Daliyah Sramek, BSN, RN

## 2024-12-06 LAB — CULTURE, GROUP A STREP (THRC)

## 2025-01-12 ENCOUNTER — Ambulatory Visit: Admitting: Family Medicine

## 2025-02-06 ENCOUNTER — Ambulatory Visit: Admitting: Family Medicine
